# Patient Record
Sex: Male | Born: 1955 | Race: Black or African American | Hispanic: No | Marital: Married | Smoking: Former smoker
Health system: Southern US, Community
[De-identification: ages and names within clinical notes are randomized; demographics above are authoritative.]

## PROBLEM LIST (undated history)

## (undated) DIAGNOSIS — J029 Acute pharyngitis, unspecified: Secondary | ICD-10-CM

## (undated) DIAGNOSIS — G629 Polyneuropathy, unspecified: Secondary | ICD-10-CM

## (undated) DIAGNOSIS — M1990 Unspecified osteoarthritis, unspecified site: Secondary | ICD-10-CM

## (undated) DIAGNOSIS — Z973 Presence of spectacles and contact lenses: Secondary | ICD-10-CM

## (undated) DIAGNOSIS — G4730 Sleep apnea, unspecified: Secondary | ICD-10-CM

## (undated) DIAGNOSIS — K219 Gastro-esophageal reflux disease without esophagitis: Secondary | ICD-10-CM

## (undated) DIAGNOSIS — Z974 Presence of external hearing-aid: Secondary | ICD-10-CM

## (undated) DIAGNOSIS — R7989 Other specified abnormal findings of blood chemistry: Secondary | ICD-10-CM

## (undated) DIAGNOSIS — E785 Hyperlipidemia, unspecified: Secondary | ICD-10-CM

## (undated) HISTORY — DX: Unspecified osteoarthritis, unspecified site: M19.90

## (undated) HISTORY — PX: COLONOSCOPY: SHX174

## (undated) HISTORY — PX: APPENDECTOMY: SHX54

## (undated) HISTORY — DX: Hyperlipidemia, unspecified: E78.5

## (undated) HISTORY — DX: Acute pharyngitis, unspecified: J02.9

## (undated) HISTORY — PX: CARPAL TUNNEL RELEASE: SHX101

## (undated) HISTORY — PX: NECK SURGERY: SHX720

---

## 2001-03-12 ENCOUNTER — Emergency Department (HOSPITAL_COMMUNITY): Admission: EM | Admit: 2001-03-12 | Discharge: 2001-03-12 | Payer: Self-pay | Admitting: Emergency Medicine

## 2001-03-12 ENCOUNTER — Encounter: Payer: Self-pay | Admitting: Emergency Medicine

## 2002-02-12 ENCOUNTER — Encounter: Payer: Self-pay | Admitting: Emergency Medicine

## 2002-02-12 ENCOUNTER — Observation Stay (HOSPITAL_COMMUNITY): Admission: EM | Admit: 2002-02-12 | Discharge: 2002-02-13 | Payer: Self-pay | Admitting: Emergency Medicine

## 2003-03-26 HISTORY — PX: SHOULDER SURGERY: SHX246

## 2003-05-22 ENCOUNTER — Emergency Department (HOSPITAL_COMMUNITY): Admission: EM | Admit: 2003-05-22 | Discharge: 2003-05-22 | Payer: Self-pay | Admitting: Emergency Medicine

## 2003-06-02 ENCOUNTER — Encounter: Admission: RE | Admit: 2003-06-02 | Discharge: 2003-06-02 | Payer: Self-pay | Admitting: Internal Medicine

## 2003-08-24 ENCOUNTER — Encounter: Admission: RE | Admit: 2003-08-24 | Discharge: 2003-08-24 | Payer: Self-pay | Admitting: Internal Medicine

## 2003-10-16 ENCOUNTER — Emergency Department (HOSPITAL_COMMUNITY): Admission: EM | Admit: 2003-10-16 | Discharge: 2003-10-16 | Payer: Self-pay | Admitting: Emergency Medicine

## 2004-04-10 ENCOUNTER — Emergency Department (HOSPITAL_COMMUNITY): Admission: EM | Admit: 2004-04-10 | Discharge: 2004-04-10 | Payer: Self-pay | Admitting: Emergency Medicine

## 2004-11-30 ENCOUNTER — Ambulatory Visit (HOSPITAL_COMMUNITY): Admission: RE | Admit: 2004-11-30 | Discharge: 2004-11-30 | Payer: Self-pay | Admitting: Gastroenterology

## 2004-11-30 LAB — HM COLONOSCOPY

## 2006-11-13 ENCOUNTER — Emergency Department (HOSPITAL_COMMUNITY): Admission: EM | Admit: 2006-11-13 | Discharge: 2006-11-13 | Payer: Self-pay | Admitting: Emergency Medicine

## 2006-12-13 ENCOUNTER — Emergency Department (HOSPITAL_COMMUNITY): Admission: EM | Admit: 2006-12-13 | Discharge: 2006-12-13 | Payer: Self-pay | Admitting: Emergency Medicine

## 2007-01-15 ENCOUNTER — Ambulatory Visit: Payer: Self-pay | Admitting: Specialist

## 2008-05-06 ENCOUNTER — Inpatient Hospital Stay: Payer: Self-pay | Admitting: Vascular Surgery

## 2009-06-26 ENCOUNTER — Emergency Department: Payer: Self-pay | Admitting: Emergency Medicine

## 2010-12-10 ENCOUNTER — Emergency Department (HOSPITAL_COMMUNITY)
Admission: EM | Admit: 2010-12-10 | Discharge: 2010-12-11 | Disposition: A | Discharge status: Home / Self Care | Payer: BC Managed Care – PPO | Attending: Emergency Medicine | Admitting: Emergency Medicine

## 2010-12-10 DIAGNOSIS — R5381 Other malaise: Secondary | ICD-10-CM | POA: Insufficient documentation

## 2010-12-10 DIAGNOSIS — R51 Headache: Secondary | ICD-10-CM | POA: Insufficient documentation

## 2010-12-10 DIAGNOSIS — J029 Acute pharyngitis, unspecified: Secondary | ICD-10-CM | POA: Insufficient documentation

## 2010-12-11 ENCOUNTER — Ambulatory Visit (INDEPENDENT_AMBULATORY_CARE_PROVIDER_SITE_OTHER): Payer: BC Managed Care – PPO | Admitting: General Surgery

## 2010-12-11 ENCOUNTER — Encounter (INDEPENDENT_AMBULATORY_CARE_PROVIDER_SITE_OTHER): Payer: Self-pay | Admitting: General Surgery

## 2010-12-11 VITALS — BP 114/82 | HR 60 | Temp 96.6°F | Resp 14 | Ht 68.0 in | Wt 184.1 lb

## 2010-12-11 DIAGNOSIS — K648 Other hemorrhoids: Secondary | ICD-10-CM

## 2010-12-11 MED ORDER — HYDROCORTISONE ACETATE 25 MG RE SUPP: 25.0000 mg | Freq: Two times a day (BID) | RECTAL | Status: AC

## 2010-12-11 NOTE — Progress Notes (Signed)
Chief Complaint  Patient presents with  . Other    eval of hemorrhoids     HPI ARCHIT LEGER is a 55 y.o. male.  The patient comes in with the complaint of hemorrhoids HPI  No past medical history on file.  No past surgical history on file.  No family history on file.  Social History History  Substance Use Topics  . Smoking status: Not on file  . Smokeless tobacco: Not on file  . Alcohol Use: Not on file    No Known Allergies  No current outpatient prescriptions on file.    Review of Systems Review of Systems  Blood pressure 114/82, pulse 60, temperature 96.6 F (35.9 C), resp. rate 14, height 5\' 8"  (1.727 m), weight 184 lb 2 oz (83.519 kg).  Physical Exam Physical Exam  Data Reviewed December 11, 2010  Assessment    Internal and external hemorrhoids at the 1:00 position    Plan    Rubber band ligation of internal hemorrhoid at 1:00 position.  Medical management with Anusol for two weeks.  Re-examinie patient in 3 weeks.      Jacquez Sheetz III,Nasiyah Laverdiere O 12/11/2010, 12:25 PM

## 2011-01-01 ENCOUNTER — Encounter (INDEPENDENT_AMBULATORY_CARE_PROVIDER_SITE_OTHER): Payer: BC Managed Care – PPO | Admitting: General Surgery

## 2011-01-21 ENCOUNTER — Ambulatory Visit (INDEPENDENT_AMBULATORY_CARE_PROVIDER_SITE_OTHER): Payer: BC Managed Care – PPO | Admitting: Surgery

## 2011-02-11 ENCOUNTER — Ambulatory Visit (INDEPENDENT_AMBULATORY_CARE_PROVIDER_SITE_OTHER): Payer: BC Managed Care – PPO | Admitting: Surgery

## 2011-02-11 ENCOUNTER — Encounter (INDEPENDENT_AMBULATORY_CARE_PROVIDER_SITE_OTHER): Payer: Self-pay | Admitting: Surgery

## 2011-02-11 VITALS — BP 132/88 | HR 60 | Temp 97.4°F | Resp 16 | Ht 68.0 in | Wt 187.0 lb

## 2011-02-11 DIAGNOSIS — K644 Residual hemorrhoidal skin tags: Secondary | ICD-10-CM

## 2011-02-11 NOTE — Progress Notes (Signed)
Subjective:     Patient ID: Andrew Fisher, male   DOB: 08-06-1955, 55 y.o.   MRN: [id number]  HPI This is a patient of Dr. Dixon Boos who he saw in September and banded hemorrhoids. The patient reports that he had significant discomfort after the banding. He initially saw him for perianal discomfort. He had failed conservative measures. He reports he is now doing well until today when he had some mild bleeding in his stool. He currently has no discomfort.  Review of Systems     Objective:   Physical Exam On exam there is a moderate sized external skin tag and a small area of excoriated skin. Digital exam and anoscopy was otherwise unremarkable    Assessment:     Bleeding external hemorrhoid    Plan:     At this point I'm going to try conservative measures with Analpram cream. He is not interested in surgery at this point. I will see him back in 4 weeks. If this worsens I will see if he wants to have the area removed in the operating room

## 2011-03-11 ENCOUNTER — Ambulatory Visit (INDEPENDENT_AMBULATORY_CARE_PROVIDER_SITE_OTHER): Payer: BC Managed Care – PPO | Admitting: Surgery

## 2011-03-11 ENCOUNTER — Encounter (INDEPENDENT_AMBULATORY_CARE_PROVIDER_SITE_OTHER): Payer: Self-pay | Admitting: Surgery

## 2011-03-11 VITALS — BP 140/82 | HR 64 | Temp 97.6°F | Resp 16 | Ht 68.0 in | Wt 188.1 lb

## 2011-03-11 DIAGNOSIS — K648 Other hemorrhoids: Secondary | ICD-10-CM

## 2011-03-11 NOTE — Progress Notes (Signed)
Subjective:     Patient ID: Andrew Fisher, male   DOB: 19-Feb-1956, 55 y.o.   MRN: [id number]  HPI He reports significant improvement with a steroid cream. He has minimal perianal discomfort and no bleeding  Review of Systems     Objective:   Physical Exam On exam, the hemorrhoids have progressed significantly.    Assessment:     Patient with improving inflamed edematous hemorrhoids    Plan:     He will continue the steroid cream. He is interested in conservative measures only and no surgery. I will see him back as needed

## 2011-07-23 ENCOUNTER — Encounter (INDEPENDENT_AMBULATORY_CARE_PROVIDER_SITE_OTHER): Payer: Self-pay | Admitting: Surgery

## 2011-07-23 ENCOUNTER — Telehealth (INDEPENDENT_AMBULATORY_CARE_PROVIDER_SITE_OTHER): Payer: Self-pay | Admitting: General Surgery

## 2011-07-23 NOTE — Telephone Encounter (Signed)
Faxed back refill on Hydrocort - pramoxine 2.5-1% cream faxed back to [facility] with 1 refill ok per Dr Magnus Ivan

## 2012-11-09 ENCOUNTER — Telehealth (INDEPENDENT_AMBULATORY_CARE_PROVIDER_SITE_OTHER): Payer: Self-pay | Admitting: General Surgery

## 2012-11-09 NOTE — Telephone Encounter (Signed)
Faxed Rx back to [facility] Hydrocort-Pramoxine 2.5-1% . No refill per Dr Magnus Ivan. Patient will need to see Family Doctor

## 2014-10-05 ENCOUNTER — Ambulatory Visit
Admission: RE | Admit: 2014-10-05 | Discharge: 2014-10-05 | Disposition: A | Discharge status: Home / Self Care | Payer: 59 | Source: Ambulatory Visit | Attending: Internal Medicine | Admitting: Internal Medicine

## 2014-10-05 ENCOUNTER — Other Ambulatory Visit: Payer: Self-pay | Admitting: Nurse Practitioner

## 2014-10-05 DIAGNOSIS — K5900 Constipation, unspecified: Secondary | ICD-10-CM

## 2014-12-17 ENCOUNTER — Emergency Department (HOSPITAL_COMMUNITY)
Admission: EM | Admit: 2014-12-17 | Discharge: 2014-12-17 | Disposition: A | Discharge status: Home / Self Care | Payer: 59 | Attending: Emergency Medicine | Admitting: Emergency Medicine

## 2014-12-17 ENCOUNTER — Encounter (HOSPITAL_COMMUNITY): Payer: Self-pay | Admitting: *Deleted

## 2014-12-17 DIAGNOSIS — H9190 Unspecified hearing loss, unspecified ear: Secondary | ICD-10-CM | POA: Insufficient documentation

## 2014-12-17 DIAGNOSIS — Z87891 Personal history of nicotine dependence: Secondary | ICD-10-CM | POA: Insufficient documentation

## 2014-12-17 DIAGNOSIS — H1131 Conjunctival hemorrhage, right eye: Secondary | ICD-10-CM | POA: Diagnosis not present

## 2014-12-17 DIAGNOSIS — Z8719 Personal history of other diseases of the digestive system: Secondary | ICD-10-CM | POA: Insufficient documentation

## 2014-12-17 DIAGNOSIS — Z7952 Long term (current) use of systemic steroids: Secondary | ICD-10-CM | POA: Insufficient documentation

## 2014-12-17 DIAGNOSIS — J45909 Unspecified asthma, uncomplicated: Secondary | ICD-10-CM | POA: Insufficient documentation

## 2014-12-17 DIAGNOSIS — Z8639 Personal history of other endocrine, nutritional and metabolic disease: Secondary | ICD-10-CM | POA: Insufficient documentation

## 2014-12-17 DIAGNOSIS — Z79899 Other long term (current) drug therapy: Secondary | ICD-10-CM | POA: Insufficient documentation

## 2014-12-17 DIAGNOSIS — H578 Other specified disorders of eye and adnexa: Secondary | ICD-10-CM | POA: Diagnosis present

## 2014-12-17 DIAGNOSIS — M2550 Pain in unspecified joint: Secondary | ICD-10-CM | POA: Diagnosis not present

## 2014-12-17 DIAGNOSIS — Z8709 Personal history of other diseases of the respiratory system: Secondary | ICD-10-CM | POA: Diagnosis not present

## 2014-12-17 MED ORDER — KETOROLAC TROMETHAMINE 0.5 % OP SOLN: 1.0000 [drp] | Freq: Four times a day (QID) | OPHTHALMIC | Status: DC

## 2014-12-17 MED ORDER — MELOXICAM 15 MG PO TABS: 15.0000 mg | ORAL_TABLET | Freq: Every day | ORAL | Status: DC

## 2014-12-17 NOTE — ED Notes (Signed)
Pt states redness to right eye. States redness has spread. Denies itching or drainage.

## 2014-12-17 NOTE — ED Provider Notes (Signed)
CSN: [id number]     Arrival date & time 12/17/14  1544 History   First MD Initiated Contact with Patient 12/17/14 1604     Chief Complaint  Patient presents with  . Eye Problem     (Consider location/radiation/quality/duration/timing/severity/associated sxs/prior Treatment) Patient is a 59 y.o. male presenting with eye problem. The history is provided by the patient.  Eye Problem Location:  R eye Severity:  Mild Onset quality:  Sudden Duration: Woke up this AM with eye redness. Timing:  Constant Progression:  Worsening Chronicity:  New Context: not direct trauma, not foreign body and not scratch   Relieved by:  Nothing Worsened by:  Nothing tried Ineffective treatments:  None tried Associated symptoms: redness   Associated symptoms: no blurred vision, no decreased vision, no double vision, no headaches, no itching, no nausea and no photophobia     Past Medical History  Diagnosis Date  . Arthritis   . Asthma   . Hyperlipidemia   . Hearing loss   . Sore throat   . Hemorrhoids    Past Surgical History  Procedure Laterality Date  . Appendectomy    . Shoulder surgery  2005    rotator cuff and tendons repaired   No family history on file. Social History  Substance Use Topics  . Smoking status: Former Games developer  . Smokeless tobacco: Never Used  . Alcohol Use: 1.2 oz/week    2 Glasses of wine per week    Review of Systems  HENT: Positive for hearing loss.   Eyes: Positive for redness. Negative for blurred vision, double vision, photophobia, pain and itching.  Gastrointestinal: Negative for nausea.  Musculoskeletal: Positive for arthralgias.  Neurological: Negative for headaches.  All other systems reviewed and are negative.     Allergies  Review of patient's allergies indicates no known allergies.  Home Medications   Prior to Admission medications   Medication Sig Start Date End Date Taking? Authorizing Provider  Choline Fenofibrate (TRILIPIX PO) Take by  mouth every hemodialysis.      Historical Provider, MD  Dexlansoprazole (DEXILANT PO) Take by mouth daily.      Historical Provider, MD  DiphenhydrAMINE HCl (ALLERGY EX) Apply topically 2 (two) times daily.      Historical Provider, MD  hydrocortisone 2.5 % cream Apply topically 2 (two) times daily.    Historical Provider, MD  levalbuterol [facility] HFA) 45 MCG/ACT inhaler Inhale 1-2 puffs into the lungs every 4 (four) hours as needed.      Historical Provider, MD  Montelukast Sodium (SINGULAIR PO) Take by mouth daily.      Historical Provider, MD  Testosterone Randa Ngo TD) Place onto the skin daily.      Historical Provider, MD   BP 126/81 mmHg  Pulse 98  Temp(Src) 97.5 F (36.4 C) (Oral)  Resp 18  Ht  (1.727 m)  Wt 202 lb (91.627 kg)  BMI 30.72 kg/m2  SpO2 98% Physical Exam  Constitutional: He is oriented to person, place, and time. He appears well-developed and well-nourished.  Non-toxic appearance.  HENT:  Head: Normocephalic.  Right Ear: Tympanic membrane and external ear normal.  Left Ear: Tympanic membrane and external ear normal.  Eyes: EOM and lids are normal. Pupils are equal, round, and reactive to light.  Fundoscopic exam:      The right eye shows no AV nicking, no exudate, no hemorrhage and no papilledema.       The left eye shows no AV nicking, no exudate, no hemorrhage  and no papilledema.  Slit lamp exam:      The right eye shows no corneal abrasion, no corneal ulcer, no foreign body, no hyphema, no fluorescein uptake and no anterior chamber bulge.       The left eye shows no anterior chamber bulge.  The pupils are equal and reactive to light. The extra ocular movements are intact. There is increase redness in the conjunctiva the nasal corner, extending to the lower lid. In the lateral lower lid there is a fluid-filled sac/cyst, that easily retracts when the lid is released. The anterior chamber is clear. There is no stye or other problem involving the lids. There is  no foreign body appreciated.  Neck: Normal range of motion. Neck supple. Carotid bruit is not present.  Cardiovascular: Normal rate, regular rhythm, normal heart sounds, intact distal pulses and normal pulses.   Pulmonary/Chest: Breath sounds normal. No respiratory distress.  Abdominal: Soft. Bowel sounds are normal. There is no tenderness. There is no guarding.  Musculoskeletal: Normal range of motion.  Lymphadenopathy:       Head (right side): No submandibular adenopathy present.       Head (left side): No submandibular adenopathy present.    He has no cervical adenopathy.  Neurological: He is alert and oriented to person, place, and time. He has normal strength. No cranial nerve deficit or sensory deficit.  Skin: Skin is warm and dry.  Psychiatric: He has a normal mood and affect. His speech is normal.  Nursing note and vitals reviewed.   ED Course  Pt seen with me by Dr Adriana Simas.  Procedures (including critical care time) Labs Review Labs Reviewed - No data to display  Imaging Review No results found. I have personally reviewed and evaluated these images and lab results as part of my medical decision-making.   EKG Interpretation None      MDM  Vital signs reviewed. Visual acuity reviewed. The examination favors a subconjunctival hemorrhage, and possibly a fluid-filled inflammatory cyst involving the conjunctiva. I have asked patient to use ketorolac eyedrops, and meloxicam tablet daily for the inflammation. I've also asked to use cold compress to the eye. He will see his ophthalmology specialist for additional evaluation if not improving. Questions were answered. The patient is in agreement with this discharge plan.    Final diagnoses:  None    **I have reviewed nursing notes, vital signs, and all appropriate lab and imaging results for this patient.Ivery Quale, PA-C 12/17/14 1707  Donnetta Hutching, MD 12/17/14 2213

## 2014-12-17 NOTE — Discharge Instructions (Signed)
You have a small amount of bleeding under the clear coating of your right eye (subconjunctival hemorrhage). You also have inflammation of the conjunctiva of the lower lid. This has formed a small bubble of fluid. Please use ketorolac eyedrops with breakfast, lunch, dinner, and bedtime over the next 7 days. Please also use meloxicam one daily with food. Please apply a cool compress to your eye is much as possible. This condition is self-limiting, and should go away on its own within the next 7-10 days. Please see your eye specialist, if not improving. Subconjunctival Hemorrhage A subconjunctival hemorrhage is a bright red patch covering a portion of the white of the eye. The white part of the eye is called the sclera, and it is covered by a thin membrane called the conjunctiva. This membrane is clear, except for tiny blood vessels that you can see with the naked eye. When your eye is irritated or inflamed and becomes red, it is because the vessels in the conjunctiva are swollen. Sometimes, a blood vessel in the conjunctiva can break and bleed. When this occurs, the blood builds up between the conjunctiva and the sclera, and spreads out to create a red area. The red spot may be very small at first. It may then spread to cover a larger part of the surface of the eye, or even all of the visible white part of the eye. In almost all cases, the blood will go away and the eye will become white again. Before completely dissolving, however, the red area may spread. It may also become brownish-yellow in color before going away. If a lot of blood collects under the conjunctiva, it may look like a bulge on the surface of the eye. This looks scary, but it will also eventually flatten out and go away. Subconjunctival hemorrhages do not cause pain, but if swollen, may cause a feeling of irritation. There is no effect on vision.  CAUSES   The most common cause is mild trauma (rubbing the eye, irritation).  Subconjunctival  hemorrhages can happen because of coughing or straining (lifting heavy objects), vomiting, or sneezing.  In some cases, your doctor may want to check your blood pressure. High blood pressure can also cause a subconjunctival hemorrhage.  Severe trauma or blunt injuries.  Diseases that affect blood clotting (hemophilia, leukemia).  Abnormalities of blood vessels behind the eye (carotid cavernous sinus fistula).  Tumors behind the eye.  Certain drugs (aspirin, Coumadin, heparin).  Recent eye surgery. HOME CARE INSTRUCTIONS   Do not worry about the appearance of your eye. You may continue your usual activities.  Often, follow-up is not necessary. SEEK MEDICAL CARE IF:   Your eye becomes painful.  The bleeding does not disappear within 3 weeks.  Bleeding occurs elsewhere, for example, under the skin, in the mouth, or in the other eye.  You have recurring subconjunctival hemorrhages. SEEK IMMEDIATE MEDICAL CARE IF:   Your vision changes or you have difficulty seeing.  You develop a severe headache, persistent vomiting, confusion, or abnormal drowsiness (lethargy).  Your eye seems to bulge or protrude from the eye socket.  You notice the sudden appearance of bruises or have spontaneous bleeding elsewhere on your body. Document Released: 03/11/2005 Document Revised: 07/26/2013 Document Reviewed: 02/06/2009 [facility] Patient Information 2015 [place], [place]. This information is not intended to replace advice given to you by your health care provider. Make sure you discuss any questions you have with your health care provider.

## 2015-06-20 ENCOUNTER — Emergency Department (HOSPITAL_COMMUNITY)
Admission: EM | Admit: 2015-06-20 | Discharge: 2015-06-20 | Disposition: A | Discharge status: Home / Self Care | Payer: 59 | Attending: Emergency Medicine | Admitting: Emergency Medicine

## 2015-06-20 ENCOUNTER — Encounter (HOSPITAL_COMMUNITY): Payer: Self-pay | Admitting: Emergency Medicine

## 2015-06-20 ENCOUNTER — Emergency Department (HOSPITAL_COMMUNITY): Payer: 59

## 2015-06-20 DIAGNOSIS — Z79899 Other long term (current) drug therapy: Secondary | ICD-10-CM | POA: Insufficient documentation

## 2015-06-20 DIAGNOSIS — H00024 Hordeolum internum left upper eyelid: Secondary | ICD-10-CM | POA: Diagnosis not present

## 2015-06-20 DIAGNOSIS — E785 Hyperlipidemia, unspecified: Secondary | ICD-10-CM | POA: Insufficient documentation

## 2015-06-20 DIAGNOSIS — Z87891 Personal history of nicotine dependence: Secondary | ICD-10-CM | POA: Diagnosis not present

## 2015-06-20 DIAGNOSIS — Z7982 Long term (current) use of aspirin: Secondary | ICD-10-CM | POA: Insufficient documentation

## 2015-06-20 DIAGNOSIS — J45909 Unspecified asthma, uncomplicated: Secondary | ICD-10-CM | POA: Diagnosis not present

## 2015-06-20 DIAGNOSIS — R42 Dizziness and giddiness: Secondary | ICD-10-CM | POA: Insufficient documentation

## 2015-06-20 DIAGNOSIS — R260 Ataxic gait: Secondary | ICD-10-CM | POA: Insufficient documentation

## 2015-06-20 LAB — CBC WITH DIFFERENTIAL/PLATELET
BASOS ABS: 0 10*3/uL (ref 0.0–0.1)
BASOS PCT: 0 %
EOS PCT: 5 %
Eosinophils Absolute: 0.2 10*3/uL (ref 0.0–0.7)
HCT: 43 % (ref 39.0–52.0)
Hemoglobin: 13.7 g/dL (ref 13.0–17.0)
Lymphocytes Relative: 44 %
Lymphs Abs: 1.4 10*3/uL (ref 0.7–4.0)
MCH: 21.6 pg — ABNORMAL LOW (ref 26.0–34.0)
MCHC: 31.9 g/dL (ref 30.0–36.0)
MCV: 67.7 fL — ABNORMAL LOW (ref 78.0–100.0)
MONO ABS: 0.3 10*3/uL (ref 0.1–1.0)
Monocytes Relative: 10 %
NEUTROS ABS: 1.3 10*3/uL — AB (ref 1.7–7.7)
Neutrophils Relative %: 40 %
Platelets: 180 10*3/uL (ref 150–400)
RBC: 6.35 MIL/uL — ABNORMAL HIGH (ref 4.22–5.81)
RDW: 19.3 % — AB (ref 11.5–15.5)
Smear Review: ADEQUATE
WBC: 3.2 10*3/uL — ABNORMAL LOW (ref 4.0–10.5)

## 2015-06-20 LAB — BASIC METABOLIC PANEL
Anion gap: 7 (ref 5–15)
BUN: 15 mg/dL (ref 6–20)
CALCIUM: 8.8 mg/dL — AB (ref 8.9–10.3)
CO2: 27 mmol/L (ref 22–32)
CREATININE: 1.09 mg/dL (ref 0.61–1.24)
Chloride: 106 mmol/L (ref 101–111)
GFR calc non Af Amer: >60 mL/min (ref >60)
Glucose, Bld: 94 mg/dL (ref 65–99)
Potassium: 4.2 mmol/L (ref 3.5–5.1)
SODIUM: 140 mmol/L (ref 135–145)

## 2015-06-20 LAB — TROPONIN I

## 2015-06-20 MED ORDER — SODIUM CHLORIDE 0.9 % IV BOLUS (SEPSIS)
1000.0000 mL | Freq: Once | INTRAVENOUS | Status: AC
  Administered 2015-06-20: 1000 mL via INTRAVENOUS

## 2015-06-20 MED ORDER — MECLIZINE HCL 12.5 MG PO TABS
25.0000 mg | ORAL_TABLET | Freq: Once | ORAL | Status: AC
  Administered 2015-06-20: 25 mg via ORAL
  Filled 2015-06-20: qty 2

## 2015-06-20 MED ORDER — MECLIZINE HCL 12.5 MG PO TABS: 12.5000 mg | ORAL_TABLET | Freq: Three times a day (TID) | ORAL | Status: DC | PRN

## 2015-06-20 NOTE — ED Provider Notes (Signed)
CSN: [id number]     Arrival date & time 06/20/15  1525 History   First MD Initiated Contact with Patient 06/20/15 1823     Chief Complaint  Patient presents with  . Dizziness     (Consider location/radiation/quality/duration/timing/severity/associated sxs/prior Treatment) HPI Comments: Patient presents with episodes of dizziness that onset this morning after he got to work. He describes with lightheaded sensation as well as vertigo. It is worse with standing and movement. He had a evaluation by his PCP last week for similar symptoms initially improved. She gave him an unknown medication for dizziness which is now out of. History of vertigo in the past. Denies any fever, chills, nausea, vomiting. No chest pain or shortness of breath. No diarrhea. No vision changes. History of asthma, hyperlipidemia and arthritis. Denies any focal weakness, numbness or tingling. 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 He feels improved when he is resting but the symptoms get worse when he stands up. He states he's been eating and drinking well.  The history is provided by the patient.    Past Medical History  Diagnosis Date  . Arthritis   . Asthma   . Hyperlipidemia   . Hearing loss   . Sore throat   . Hemorrhoids    Past Surgical History  Procedure Laterality Date  . Appendectomy    . Shoulder surgery  2005    rotator cuff and tendons repaired   History reviewed. No pertinent family history. Social History  Substance Use Topics  . Smoking status: Former Games developer  . Smokeless tobacco: Never Used  . Alcohol Use: 1.2 oz/week    2 Glasses of wine per week    Review of Systems  Constitutional: Negative for fever, activity change and appetite change.  HENT: Negative for congestion and rhinorrhea.   Eyes: Negative for visual disturbance.  Respiratory: Negative for cough, chest tightness and shortness of breath.    Cardiovascular: Negative for chest pain.  Gastrointestinal: Negative for vomiting and abdominal pain.  Genitourinary: Negative for dysuria and scrotal swelling.  Musculoskeletal: Negative for myalgias and arthralgias.  Skin: Negative for wound.  Neurological: Positive for dizziness and light-headedness. Negative for weakness and headaches.  A complete 10 system review of systems was obtained and all systems are negative except as noted in the HPI and PMH.      Allergies  Acyclovir and related  Home Medications   Prior to Admission medications   Medication Sig Start Date End Date Taking? Authorizing Provider  albuterol (PROAIR HFA) 108 (90 Base) MCG/ACT inhaler Inhale 1-2 puffs into the lungs every 6 (six) hours as needed for wheezing or shortness of breath.   Yes Historical Provider, MD  aspirin EC 81 MG tablet Take 81 mg by mouth daily.   Yes Historical Provider, MD  Choline Fenofibrate (TRILIPIX PO) Take by mouth every hemodialysis.     Yes Historical Provider, MD  Dexlansoprazole (DEXILANT PO) Take by mouth daily.     Yes Historical Provider, MD  montelukast (SINGULAIR) 10 MG tablet Take 10 mg by mouth daily.   Yes Historical Provider, MD  Testosterone (AXIRON) 30 MG/ACT SOLN Place 1 application onto the skin daily. *Underarm   Yes Historical Provider, MD  meclizine (ANTIVERT) 12.5 MG tablet Take 1 tablet (12.5 mg total) by mouth 3 (three) times daily as needed for dizziness. 06/20/15   Glynn Octave, MD   BP 139/92 mmHg  Pulse  68  Temp(Src) 98.3 F (36.8 C)  Resp 17  Ht 6' (1.829 m)  Wt 202 lb (91.627 kg)  BMI 27.39 kg/m2  SpO2 100% Physical Exam  Constitutional: He is oriented to person, place, and time. He appears well-developed and well-nourished. No distress.  HENT:  Head: Normocephalic and atraumatic.  Mouth/Throat: Oropharynx is clear and moist. No oropharyngeal exudate.  No mastoid tenderness. Tympanic membranes normal. No tragus tenderness.  Eyes: Conjunctivae  and EOM are normal. Pupils are equal, round, and reactive to light.  Stye to the left upper lid, no nystagmus  Neck: Normal range of motion. Neck supple.  No meningismus.  Cardiovascular: Normal rate, regular rhythm, normal heart sounds and intact distal pulses.   No murmur heard. Pulmonary/Chest: Effort normal and breath sounds normal. No respiratory distress.  Abdominal: Soft. There is no tenderness. There is no rebound and no guarding.  Musculoskeletal: Normal range of motion. He exhibits no edema or tenderness.  Neurological: He is alert and oriented to person, place, and time. No cranial nerve deficit. He exhibits normal muscle tone. Coordination normal.  No ataxia on finger to nose bilaterally. No pronator drift. 5/5 strength throughout. CN 2-12 intact.Equal grip strength. Sensation intact.  Romberg negative. Mildly ataxic gait. No nystagmus. Head impulse testing negative. Test of skew negative.  Skin: Skin is warm.  Psychiatric: He has a normal mood and affect. His behavior is normal.  Nursing note and vitals reviewed.   ED Course  Procedures (including critical care time) Labs Review Labs Reviewed  CBC WITH DIFFERENTIAL/PLATELET - Abnormal; Notable for the following:    WBC 3.2 (*)    RBC 6.35 (*)    MCV 67.7 (*)    MCH 21.6 (*)    RDW 19.3 (*)    Neutro Abs 1.3 (*)    All other components within normal limits  BASIC METABOLIC PANEL - Abnormal; Notable for the following:    Calcium 8.8 (*)    All other components within normal limits  TROPONIN I    Imaging Review Mr Brain Wo Contrast  06/20/2015  CLINICAL DATA:  Dizziness for several days. EXAM: MRI HEAD WITHOUT CONTRAST TECHNIQUE: Multiplanar, multiecho pulse sequences of the brain and surrounding structures were obtained without intravenous contrast. COMPARISON:  None. FINDINGS: No evidence for acute infarction, hemorrhage, mass lesion, hydrocephalus, or extra-axial fluid. Normal cerebral volume. Mild subcortical and  periventricular T2 and FLAIR hyperintensities, likely chronic microvascular ischemic change. Normal pituitary. Mild tonsillar ectopia without frank Chiari I malformation. Flow voids are maintained throughout the carotid, basilar, and vertebral arteries. There are no areas of chronic hemorrhage. The major dural venous sinuses appear patent. Normal orbits, sinuses, and extracranial soft tissues. Abnormal T2 and FLAIR hyperintensity throughout the mastoids, LEFT greater than RIGHT, without nasopharyngeal findings, could represent mastoid effusions or temporal bone inflammatory process. Correlate clinically for otitis or mastoiditis. IMPRESSION: No acute intracranial findings. Mild subcortical and periventricular T2 and FLAIR hyperintensities, likely chronic microvascular ischemic change. Abnormal T2 bright fluid throughout the mastoid air cells. Correlate clinically for mastoiditis versus simple unrelated mastoid effusions. Electronically Signed   By: Elsie Stain M.D.   On: 06/20/2015 18:16   I have personally reviewed and evaluated these images and lab results as part of my medical decision-making.   EKG Interpretation   Date/Time:  Tuesday June 20 2015 15:39:30 EDT Ventricular Rate:  57 PR Interval:  184 QRS Duration: 84 QT Interval:  338 QTC Calculation: 328 R Axis:   66 Text Interpretation:  Sinus bradycardia Cannot rule out Inferior infarct ,  age undetermined Abnormal ECG No significant change was found Confirmed by  Manus GunningANCOUR  MD, Prudencio Velazco [id number]) on 06/20/2015 6:43:58 PM      MDM   Final diagnoses:  Dizziness  Dizziness described as lightheadedness and vertigo that is worse with position change since this morning. Similar episodes last week. Nonfocal neurological exam. Somewhat ataxic gait. Negative Romberg and no nystagmus. No chest pain or shortness of breath.  Orthostatics are negative. Labs show normal hemoglobin but low MCV and MCH. Troponin negative.  MRI obtained and shows no  acute infarct. There is fluid in the mastoid air cells but no clinical evidence of mastoiditis or otitis.  Patient has no ear pain or mastoid pain. There is no fever or erythema. There is no leukocytosis. There is no evidence of mastoiditis or otitis.  Patient feels improved after meclizine and by mouth and IV fluids. He is tolerating by mouth and ambulatory. Treat as peripheral vertigo. Continue PO hydration, PRN meclizine. No evidence of CVA. Follow up with PCP. Return precautions discussed.  Glynn OctaveStephen Taggert Bozzi, MD 06/21/15 [phone]

## 2015-06-20 NOTE — Discharge Instructions (Signed)
Dizziness Your testing is negative for stroke. Follow up with your doctor. Return to the ED if you develop new or worsening symptoms. Dizziness is a common problem. It is a feeling of unsteadiness or light-headedness. You may feel like you are about to faint. Dizziness can lead to injury if you stumble or fall. Anyone can become dizzy, but dizziness is more common in older adults. This condition can be caused by a number of things, including medicines, dehydration, or illness. HOME CARE INSTRUCTIONS Taking these steps may help with your condition: Eating and Drinking  Drink enough fluid to keep your urine clear or pale yellow. This helps to keep you from becoming dehydrated. Try to drink more clear fluids, such as water.  Do not drink alcohol.  Limit your caffeine intake if directed by your health care provider.  Limit your salt intake if directed by your health care provider. Activity  Avoid making quick movements.  Rise slowly from chairs and steady yourself until you feel okay.  In the morning, first sit up on the side of the bed. When you feel okay, stand slowly while you hold onto something until you know that your balance is fine.  Move your legs often if you need to stand in one place for a long time. Tighten and relax your muscles in your legs while you are standing.  Do not drive or operate heavy machinery if you feel dizzy.  Avoid bending down if you feel dizzy. Place items in your home so that they are easy for you to reach without leaning over. Lifestyle  Do not use any tobacco products, including cigarettes, chewing tobacco, or electronic cigarettes. If you need help quitting, ask your health care provider.  Try to reduce your stress level, such as with yoga or meditation. Talk with your health care provider if you need help. General Instructions  Watch your dizziness for any changes.  Take medicines only as directed by your health care provider. Talk with your health  care provider if you think that your dizziness is caused by a medicine that you are taking.  Tell a friend or a family member that you are feeling dizzy. If he or she notices any changes in your behavior, have this person call your health care provider.  Keep all follow-up visits as directed by your health care provider. This is important. SEEK MEDICAL CARE IF:  Your dizziness does not go away.  Your dizziness or light-headedness gets worse.  You feel nauseous.  You have reduced hearing.  You have new symptoms.  You are unsteady on your feet or you feel like the room is spinning. SEEK IMMEDIATE MEDICAL CARE IF:  You vomit or have diarrhea and are unable to eat or drink anything.  You have problems talking, walking, swallowing, or using your arms, hands, or legs.  You feel generally weak.  You are not thinking clearly or you have trouble forming sentences. It may take a friend or family member to notice this.  You have chest pain, abdominal pain, shortness of breath, or sweating.  Your vision changes.  You notice any bleeding.  You have a headache.  You have neck pain or a stiff neck.  You have a fever.   This information is not intended to replace advice given to you by your health care provider. Make sure you discuss any questions you have with your health care provider.   Document Released: 09/04/2000 Document Revised: 07/26/2014 Document Reviewed: 03/07/2014 Elsevier Interactive Patient Education  2016 Elsevier Inc. ° °

## 2015-06-20 NOTE — ED Notes (Signed)
Pt drinking water and ambulated in the hallway. Pt states feels little light headed,better than before.

## 2015-06-20 NOTE — ED Notes (Signed)
Having dizziness since last Tuesday and seen by PCP on Wed.  Dizziness started back today.  Vision changes with movement.  Denies any pain at this time.  Pt says when he stands, he gets lightheaded.  History of vertigo about 10 years ago.

## 2015-10-10 ENCOUNTER — Other Ambulatory Visit: Payer: Self-pay | Admitting: Internal Medicine

## 2015-10-10 DIAGNOSIS — R1032 Left lower quadrant pain: Secondary | ICD-10-CM

## 2015-10-27 ENCOUNTER — Ambulatory Visit
Admission: RE | Admit: 2015-10-27 | Discharge: 2015-10-27 | Disposition: A | Discharge status: Home / Self Care | Payer: 59 | Source: Ambulatory Visit | Attending: Internal Medicine | Admitting: Internal Medicine

## 2015-10-27 DIAGNOSIS — R1032 Left lower quadrant pain: Secondary | ICD-10-CM

## 2015-10-27 MED ORDER — IOPAMIDOL (ISOVUE-300) INJECTION 61%
100.0000 mL | Freq: Once | INTRAVENOUS | Status: AC | PRN
  Administered 2015-10-27: 100 mL via INTRAVENOUS

## 2015-11-29 ENCOUNTER — Encounter (HOSPITAL_COMMUNITY): Payer: Self-pay | Admitting: *Deleted

## 2015-11-29 ENCOUNTER — Emergency Department (HOSPITAL_COMMUNITY)
Admission: EM | Admit: 2015-11-29 | Discharge: 2015-11-29 | Disposition: A | Discharge status: Home / Self Care | Payer: 59 | Attending: Emergency Medicine | Admitting: Emergency Medicine

## 2015-11-29 ENCOUNTER — Emergency Department (HOSPITAL_COMMUNITY): Payer: 59

## 2015-11-29 DIAGNOSIS — Z87891 Personal history of nicotine dependence: Secondary | ICD-10-CM | POA: Insufficient documentation

## 2015-11-29 DIAGNOSIS — J069 Acute upper respiratory infection, unspecified: Secondary | ICD-10-CM | POA: Diagnosis not present

## 2015-11-29 DIAGNOSIS — Z7982 Long term (current) use of aspirin: Secondary | ICD-10-CM | POA: Diagnosis not present

## 2015-11-29 DIAGNOSIS — Z79899 Other long term (current) drug therapy: Secondary | ICD-10-CM | POA: Insufficient documentation

## 2015-11-29 DIAGNOSIS — J029 Acute pharyngitis, unspecified: Secondary | ICD-10-CM | POA: Diagnosis present

## 2015-11-29 DIAGNOSIS — J45909 Unspecified asthma, uncomplicated: Secondary | ICD-10-CM | POA: Diagnosis not present

## 2015-11-29 LAB — RAPID STREP SCREEN (MED CTR MEBANE ONLY): STREPTOCOCCUS, GROUP A SCREEN (DIRECT): NEGATIVE

## 2015-11-29 MED ORDER — DM-GUAIFENESIN ER 30-600 MG PO TB12
1.0000 | ORAL_TABLET | Freq: Two times a day (BID) | ORAL | Status: DC
  Administered 2015-11-29: 1 via ORAL
  Filled 2015-11-29: qty 1

## 2015-11-29 NOTE — Discharge Instructions (Signed)
Drink plenty of fluids. You can take Claritin over-the-counter for your nasal congestion and sneezing. You can take Mucinex DM for your cough. Use your inhaler as needed for wheezing. Recheck if you get a high fever or if you're not improving over the next 10 days.

## 2015-11-29 NOTE — ED Notes (Signed)
Pt alert & oriented x4, stable gait. Patient given discharge instructions, paperwork & prescription(s). Patient  instructed to stop at the registration desk to finish any additional paperwork. Patient verbalized understanding. Pt left department w/ no further questions. 

## 2015-11-29 NOTE — ED Provider Notes (Signed)
[facility] Provider Note   CSN: [id number] Arrival date & time: 11/29/15  0027  Time Seen 02:45 AM   History   Chief Complaint Chief Complaint  Patient presents with  . Cough    HPI Andrew Fisher is a 60 y.o. male.  HPI patient states September 2 he started having a sore throat, sneezing, nasal stuffiness, upper chest discomfort when he coughs, and a cough that sometimes has minimal amount of white or clear mucus with a yellow tinge in it that started today. He denies fever or chills. He states he had nausea without vomiting today and then got a little sweaty. He had some shortness of breath but states it was not his asthma or wheezing. He states he has not had diarrhea. He has not been around anybody else who is ill. He took some over-the-counter medications for cough without relief and he did some salt water gargles for his sore throat  PCP Dr Maggie Font  Past Medical History:  Diagnosis Date  . Arthritis   . Asthma   . Hearing loss   . Hemorrhoids   . Hyperlipidemia   . Sore throat     Patient Active Problem List   Diagnosis Date Noted  . Hemorrhoids, internal 12/11/2010    Past Surgical History:  Procedure Laterality Date  . APPENDECTOMY    . NECK SURGERY    . SHOULDER SURGERY  2005   rotator cuff and tendons repaired       Home Medications    Prior to Admission medications   Medication Sig Start Date End Date Taking? Authorizing Provider  albuterol (PROAIR HFA) 108 (90 Base) MCG/ACT inhaler Inhale 1-2 puffs into the lungs every 6 (six) hours as needed for wheezing or shortness of breath.   Yes Historical Provider, MD  aspirin EC 81 MG tablet Take 81 mg by mouth daily.   Yes Historical Provider, MD  atorvastatin (LIPITOR) 10 MG tablet Take 10 mg by mouth daily.   Yes Historical Provider, MD  cholecalciferol (VITAMIN D) 1000 units tablet Take 1,000 Units by mouth daily.   Yes Historical Provider, MD  Dexlansoprazole (DEXILANT PO) Take by mouth  daily.     Yes Historical Provider, MD  gabapentin (NEURONTIN) 300 MG capsule Take 300 mg by mouth 4 (four) times daily.   Yes Historical Provider, MD  lansoprazole (PREVACID) 30 MG capsule Take 30 mg by mouth daily at 12 noon.   Yes Historical Provider, MD  meclizine (ANTIVERT) 12.5 MG tablet Take 1 tablet (12.5 mg total) by mouth 3 (three) times daily as needed for dizziness. 06/20/15  Yes Glynn Octave, MD  montelukast (SINGULAIR) 10 MG tablet Take 10 mg by mouth daily.   Yes Historical Provider, MD  tamsulosin (FLOMAX) 0.4 MG CAPS capsule Take 0.4 mg by mouth.   Yes Historical Provider, MD  Testosterone (AXIRON) 30 MG/ACT SOLN Place 1 application onto the skin daily. *Underarm   Yes Historical Provider, MD  Choline Fenofibrate (TRILIPIX PO) Take by mouth every hemodialysis.      Historical Provider, MD    Family History No family history on file.  Social History Social History  Substance Use Topics  . Smoking status: Former Games developer  . Smokeless tobacco: Never Used  . Alcohol use 1.2 oz/week    2 Glasses of wine per week  employed   Allergies   Review of patient's allergies indicates no active allergies.   Review of Systems Review of Systems  All other systems reviewed  and are negative.    Physical Exam Updated Vital Signs BP 111/98 (BP Location: Right Arm)   Pulse 64   Temp 98.6 F (37 C) (Oral)   Resp 16   Ht 5\' 8"  (1.727 m)   Wt 202 lb (91.6 kg)   SpO2 95%   BMI 30.71 kg/m   Vital signs normal    Physical Exam  Constitutional: He is oriented to person, place, and time. He appears well-developed and well-nourished.  Non-toxic appearance. He does not appear ill. No distress.  HENT:  Head: Normocephalic and atraumatic.  Right Ear: External ear normal.  Left Ear: External ear normal.  Nose: Nose normal. No mucosal edema or rhinorrhea.  Mouth/Throat: Oropharynx is clear and moist and mucous membranes are normal. No dental abscesses or uvula swelling.  Eyes:  Conjunctivae and EOM are normal. Pupils are equal, round, and reactive to light.  Neck: Normal range of motion and full passive range of motion without pain. Neck supple.  Cardiovascular: Normal rate, regular rhythm and normal heart sounds.  Exam reveals no gallop and no friction rub.   No murmur heard. Pulmonary/Chest: Effort normal and breath sounds normal. No respiratory distress. He has no wheezes. He has no rhonchi. He has no rales. He exhibits no tenderness and no crepitus.  Abdominal: Soft. Normal appearance and bowel sounds are normal. He exhibits no distension. There is no tenderness. There is no rebound and no guarding.  Musculoskeletal: Normal range of motion. He exhibits no edema or tenderness.  Moves all extremities well.   Neurological: He is alert and oriented to person, place, and time. He has normal strength. No cranial nerve deficit.  Skin: Skin is warm, dry and intact. No rash noted. No erythema. No pallor.  Psychiatric: He has a normal mood and affect. His speech is normal and behavior is normal. His mood appears not anxious.  Nursing note and vitals reviewed.    ED Treatments / Results  Labs (all labs ordered are listed, but only abnormal results are displayed) Results for orders placed or performed during the hospital encounter of 11/29/15  Rapid strep screen  Result Value Ref Range   Streptococcus, Group A Screen (Direct) NEGATIVE NEGATIVE     Radiology Dg Chest 2 View  Result Date: 11/29/2015 CLINICAL DATA:  Acute onset of minimal productive cough, sore throat and chest tenderness. Initial encounter. EXAM: CHEST  2 VIEW COMPARISON:  Chest radiograph performed 06/26/2009 FINDINGS: The lungs are well-aerated. Mild vascular congestion is noted. There is no evidence of focal opacification, pleural effusion or pneumothorax. The heart is normal in size; the mediastinal contour is within normal limits. No acute osseous abnormalities are seen. IMPRESSION: Mild vascular  congestion noted.  Lungs remain grossly clear. Electronically Signed   By: Roanna Raider M.D.   On: 11/29/2015 01:12    Procedures Procedures (including critical care time)  Medications Ordered in ED Medications  dextromethorphan-guaiFENesin (MUCINEX DM) 30-600 MG per 12 hr tablet 1 tablet (1 tablet Oral Given 11/29/15 0323)     Initial Impression / Assessment and Plan / ED Course  I have reviewed the triage vital signs and the nursing notes.  Pertinent labs & imaging results that were available during my care of the patient were reviewed by me and considered in my medical decision making (see chart for details).  Clinical Course   Patient was given Mucinex DM while waiting for test results.  4:40 AM patient was given his test results. He has improvement of his  cough with Mucinex DM. We discussed that he has a cold or viral illness. We also discussed antibiotics will not help at this point. He can take Claritin over-the-counter for his nasal stuffiness, Mucinex DM over-the-counter for his cough. He should use his inhalers as needed if he has wheezing. He should be rechecked if he gets a high fever, struggles to breathe despite using his inhaler or if he seems worse.  Final Clinical Impressions(s) / ED Diagnoses   Final diagnoses:  URI (upper respiratory infection)    New Prescriptions OTC claritin and mucinex DM  Plan discharge  Devoria AlbeIva Tityana Pagan, MD, Concha PyoFACEP    Andrew Schaller, MD 11/29/15 [phone]

## 2015-11-29 NOTE — ED Notes (Signed)
Patient transported to X-ray 

## 2015-11-29 NOTE — ED Triage Notes (Signed)
Pt states unproductive couch w/ sore throat & congestion for the past 3 days.

## 2015-12-01 LAB — CULTURE, GROUP A STREP (THRC)

## 2015-12-05 ENCOUNTER — Ambulatory Visit: Payer: 59 | Admitting: Allergy & Immunology

## 2016-12-19 ENCOUNTER — Other Ambulatory Visit: Payer: Self-pay | Admitting: Orthopedic Surgery

## 2017-01-03 NOTE — Pre-Procedure Instructions (Signed)
Andrew Fisher  01/03/2017      [facility] - [place], [place] - [address] [place] [address] Phone: [phone] Fax: [phone]  [facility] - [place], [place] - [address] [place] [address] Phone: [phone] Fax: [phone]    Your procedure is scheduled on Thursday, January 09, 2017  Report to [facility] Admitting Entrance "A" at 5:30 A.M.   Call this number if you have problems the morning of surgery:  [phone]   Remember:  Do not eat food or drink liquids after midnight.  Take these medicines the morning of surgery with A SIP OF WATER: Gabapentin (NEURONTIN), Loratadine (CLARITIN), and Tamsulosin (FLOMAX). If needed HYDROcodone-acetaminophen (NORCO/VICODIN) for pain and Albuterol (PROAIR HFA) inhaler for cough or wheezing (bring with you the day of surgery).  Follow your doctor's instruction for taking Aspirin.  As of today, stop taking all Aspirins, Vitamins, Fish oils, and Herbal medications. Also stop all NSAIDS i.e. Advil, Motrin, Aleve, Anaprox, Naproxen, BC and Goody Powders.    Do not wear jewelry.  Do not wear lotions, powders, colognes, or deodrant.  Do not shave 48 hours prior to surgery.  Men may shave face and neck.  Do not bring valuables to the hospital.  [facility] is not responsible for any belongings or valuables.  Contacts, dentures or bridgework may not be worn into surgery.  Leave your suitcase in the car.  After surgery it may be brought to your room.  For patients admitted to the hospital, discharge time will be determined by your treatment team.  Patients discharged the day of surgery will not be allowed to drive home.   Special instructions:  [facility]- Preparing For Surgery  Before surgery, you can play an important role. Because skin is not sterile, your skin needs to be as free of germs as possible. You can reduce the number of  germs on your skin by washing with CHG (chlorahexidine gluconate) Soap before surgery.  CHG is an antiseptic cleaner which kills germs and bonds with the skin to continue killing germs even after washing.  Please do not use if you have an allergy to CHG or antibacterial soaps. If your skin becomes reddened/irritated stop using the CHG.  Do not shave (including legs and underarms) for at least 48 hours prior to first CHG shower. It is OK to shave your face.  Please follow these instructions carefully.   1. Shower the NIGHT BEFORE SURGERY and the MORNING OF SURGERY with CHG.   2. If you chose to wash your hair, wash your hair first as usual with your normal shampoo.  3. After you shampoo, rinse your hair and body thoroughly to remove the shampoo.  4. Use CHG as you would any other liquid soap. You can apply CHG directly to the skin and wash gently with a scrungie or a clean washcloth.   5. Apply the CHG Soap to your body ONLY FROM THE NECK DOWN.  Do not use on open wounds or open sores. Avoid contact with your eyes, ears, mouth and genitals (private parts). Wash Face and genitals (private parts)  with your normal soap.  6. Wash thoroughly, paying special attention to the area where your surgery will be performed.  7. Thoroughly rinse your body with warm water from the neck down.  8. DO NOT shower/wash with your normal soap after using and rinsing off the  CHG Soap.  9. Pat yourself dry with a CLEAN TOWEL.  10. Wear CLEAN PAJAMAS to bed the night before surgery, wear comfortable clothes the morning of surgery  11. Place CLEAN SHEETS on your bed the night of your first shower and DO NOT SLEEP WITH PETS.  Day of Surgery: Do not apply any deodorants/lotions. Please wear clean clothes to the hospital/surgery center.    Please read over the following fact sheets that you were given. Pain Booklet, Coughing and Deep Breathing, MRSA Information and Surgical Site Infection Prevention

## 2017-01-06 ENCOUNTER — Encounter (HOSPITAL_COMMUNITY)
Admission: RE | Admit: 2017-01-06 | Discharge: 2017-01-06 | Disposition: A | Discharge status: Home / Self Care | Payer: 59 | Source: Ambulatory Visit | Attending: Orthopedic Surgery | Admitting: Orthopedic Surgery

## 2017-01-06 ENCOUNTER — Encounter (HOSPITAL_COMMUNITY): Payer: Self-pay

## 2017-01-06 ENCOUNTER — Ambulatory Visit (HOSPITAL_COMMUNITY)
Admission: RE | Admit: 2017-01-06 | Discharge: 2017-01-06 | Disposition: A | Discharge status: Home / Self Care | Payer: 59 | Source: Ambulatory Visit | Attending: Orthopedic Surgery | Admitting: Orthopedic Surgery

## 2017-01-06 DIAGNOSIS — Z79899 Other long term (current) drug therapy: Secondary | ICD-10-CM | POA: Insufficient documentation

## 2017-01-06 DIAGNOSIS — Z01818 Encounter for other preprocedural examination: Secondary | ICD-10-CM

## 2017-01-06 DIAGNOSIS — G629 Polyneuropathy, unspecified: Secondary | ICD-10-CM | POA: Diagnosis not present

## 2017-01-06 DIAGNOSIS — I491 Atrial premature depolarization: Secondary | ICD-10-CM | POA: Insufficient documentation

## 2017-01-06 DIAGNOSIS — E785 Hyperlipidemia, unspecified: Secondary | ICD-10-CM | POA: Insufficient documentation

## 2017-01-06 DIAGNOSIS — Z01810 Encounter for preprocedural cardiovascular examination: Secondary | ICD-10-CM | POA: Insufficient documentation

## 2017-01-06 DIAGNOSIS — Z87891 Personal history of nicotine dependence: Secondary | ICD-10-CM | POA: Insufficient documentation

## 2017-01-06 DIAGNOSIS — J45909 Unspecified asthma, uncomplicated: Secondary | ICD-10-CM | POA: Insufficient documentation

## 2017-01-06 DIAGNOSIS — Z7982 Long term (current) use of aspirin: Secondary | ICD-10-CM | POA: Diagnosis not present

## 2017-01-06 DIAGNOSIS — K219 Gastro-esophageal reflux disease without esophagitis: Secondary | ICD-10-CM | POA: Insufficient documentation

## 2017-01-06 DIAGNOSIS — J984 Other disorders of lung: Secondary | ICD-10-CM | POA: Diagnosis not present

## 2017-01-06 DIAGNOSIS — G4733 Obstructive sleep apnea (adult) (pediatric): Secondary | ICD-10-CM | POA: Insufficient documentation

## 2017-01-06 HISTORY — DX: Sleep apnea, unspecified: G47.30

## 2017-01-06 HISTORY — DX: Presence of external hearing-aid: Z97.4

## 2017-01-06 HISTORY — DX: Presence of spectacles and contact lenses: Z97.3

## 2017-01-06 HISTORY — DX: Polyneuropathy, unspecified: G62.9

## 2017-01-06 HISTORY — DX: Gastro-esophageal reflux disease without esophagitis: K21.9

## 2017-01-06 HISTORY — DX: Other specified abnormal findings of blood chemistry: R79.89

## 2017-01-06 LAB — URINALYSIS, ROUTINE W REFLEX MICROSCOPIC
Bilirubin Urine: NEGATIVE
GLUCOSE, UA: NEGATIVE mg/dL
HGB URINE DIPSTICK: NEGATIVE
Ketones, ur: NEGATIVE mg/dL
Leukocytes, UA: NEGATIVE
Nitrite: NEGATIVE
PROTEIN: NEGATIVE mg/dL
Specific Gravity, Urine: 1.013 (ref 1.005–1.030)
pH: 7 (ref 5.0–8.0)

## 2017-01-06 LAB — APTT: APTT: 28 s (ref 24–36)

## 2017-01-06 LAB — PROTIME-INR
INR: 1.12
Prothrombin Time: 14.3 seconds (ref 11.4–15.2)

## 2017-01-06 LAB — CBC WITH DIFFERENTIAL/PLATELET
BASOS ABS: 0 10*3/uL (ref 0.0–0.1)
BASOS PCT: 0 %
EOS ABS: 0.3 10*3/uL (ref 0.0–0.7)
Eosinophils Relative: 11 %
HCT: 45 % (ref 39.0–52.0)
Hemoglobin: 14.4 g/dL (ref 13.0–17.0)
LYMPHS ABS: 1.2 10*3/uL (ref 0.7–4.0)
Lymphocytes Relative: 40 %
MCH: 21.2 pg — AB (ref 26.0–34.0)
MCHC: 32 g/dL (ref 30.0–36.0)
MCV: 66.4 fL — ABNORMAL LOW (ref 78.0–100.0)
MONO ABS: 0.3 10*3/uL (ref 0.1–1.0)
Monocytes Relative: 9 %
NEUTROS ABS: 1.1 10*3/uL — AB (ref 1.7–7.7)
Neutrophils Relative %: 40 %
PLATELETS: 155 10*3/uL (ref 150–400)
RBC: 6.78 MIL/uL — ABNORMAL HIGH (ref 4.22–5.81)
RDW: 20 % — AB (ref 11.5–15.5)
WBC: 2.9 10*3/uL — ABNORMAL LOW (ref 4.0–10.5)

## 2017-01-06 LAB — COMPREHENSIVE METABOLIC PANEL
ALBUMIN: 4.1 g/dL (ref 3.5–5.0)
ALK PHOS: 61 U/L (ref 38–126)
ALT: 27 U/L (ref 17–63)
ANION GAP: 8 (ref 5–15)
AST: 28 U/L (ref 15–41)
BUN: 16 mg/dL (ref 6–20)
CALCIUM: 8.8 mg/dL — AB (ref 8.9–10.3)
CHLORIDE: 105 mmol/L (ref 101–111)
CO2: 24 mmol/L (ref 22–32)
Creatinine, Ser: 1.25 mg/dL — ABNORMAL HIGH (ref 0.61–1.24)
GFR calc non Af Amer: >60 mL/min (ref >60)
Glucose, Bld: 94 mg/dL (ref 65–99)
POTASSIUM: 4.1 mmol/L (ref 3.5–5.1)
SODIUM: 137 mmol/L (ref 135–145)
Total Bilirubin: 0.9 mg/dL (ref 0.3–1.2)
Total Protein: 7.2 g/dL (ref 6.5–8.1)

## 2017-01-06 LAB — SURGICAL PCR SCREEN
MRSA, PCR: NEGATIVE
Staphylococcus aureus: NEGATIVE

## 2017-01-06 NOTE — Progress Notes (Signed)
PCP: Dr. Allyne Gee with [facility], last saw 1 month ago Cardiologist: denies  EKG: today CXR: today ECHO: denies Stress Test: denies Cardiac Cath: denies  Patient denies shortness of breath, fever, cough, and chest pain at PAT appointment.  Patient verbalized understanding of instructions provided today at the PAT appointment.  Patient asked to review instructions at home and day of surgery.   Pt had sleep study at [facility] in [place].  Release of information obtained, requested records.  Pt instructed to bring CPAP mask with him DOS.

## 2017-01-07 NOTE — Progress Notes (Signed)
Anesthesia Chart Review: Patient is a 61 year old male scheduled for C4-5, C5-6 ACDF on 01/09/17 by Dr. Estill Bamberg.  History includes former smoker, HLD, asthma, arthritis, hearing loss, GERD, OSA (uses CPAP), low T, hearing aids, peripheral neuropathy, appendectomy, neck surgery.   PCP is Dr. Dorothyann Peng with [facility].  Meds include albuterol, ASA 81 mg (on hold), Lipitor, Neurontin, Norco, Prevacid, Claritin, Singulair, Flomax, testosterone.   BP 135/83   Pulse 69   Temp 36.8 C   Resp 20   Ht 6' (1.829 m)   Wt 211 lb 3.2 oz (95.8 kg)   SpO2 97%   BMI 28.64 kg/m   EKG 01/06/17: SR with PACs, possible inferior infarct, age undetermined. No significant change since last tracing on 06/20/15. (Possible inferior infarct seen on tracings dated back to at least 06/26/09.)  CXR 01/06/17: IMPRESSION: Minimal RIGHT basilar scarring. No acute abnormalities.  Preoperative labs noted. Cr 1.25. Glucose 94. WBC 2.9. Neutrophil 40%, Neut# 1.1 K/uL. (Previously WBC 3.2, Neutrophils 40, Neut# 1.3 on 06/20/15.)  RBC morphology teardrop cells. H/H 14.4/45.0. PLT 155. PT, PTT, UA WNL.   EKG is stable. Discussed CBC with diff results with anesthesiologist Dr. Kipp Brood. Patient Neut# > 1 K/uL. His H/H, PLT count, PT/PTT are WNL. Recommend forwarding to PCP for future follow-up, but otherwise labs felt acceptable for OR. I called and spoke with Karel Jarvis at Dr. Allyne Gee office. I will fax labs with confirmation. I asked Ebony to call me back if labs not received.   Velna Ochs [facility]/Anesthesiology Phone [phone] 01/07/2017 4:07 PM

## 2017-01-08 NOTE — Anesthesia Preprocedure Evaluation (Addendum)
Anesthesia Evaluation  Patient identified by MRN, date of birth, ID band Patient awake    Reviewed: Allergy & Precautions, NPO status , Patient's Chart, lab work & pertinent test results  Airway Mallampati: II  TM Distance: >3 FB Neck ROM: Full    Dental  (+) Dental Advisory Given   Pulmonary asthma , sleep apnea , former smoker,    breath sounds clear to auscultation       Cardiovascular negative cardio ROS   Rhythm:Regular Rate:Normal     Neuro/Psych bilateral arm pain 2/2 cervical stenosis    GI/Hepatic Neg liver ROS, GERD  ,  Endo/Other  negative endocrine ROS  Renal/GU Renal disease     Musculoskeletal   Abdominal   Peds  Hematology negative hematology ROS (+)   Anesthesia Other Findings   Reproductive/Obstetrics                            Lab Results  Component Value Date   WBC 2.9 (L) 01/06/2017   HGB 14.4 01/06/2017   HCT 45.0 01/06/2017   MCV 66.4 (L) 01/06/2017   PLT 155 01/06/2017   Lab Results  Component Value Date   CREATININE 1.25 (H) 01/06/2017   BUN 16 01/06/2017   NA 137 01/06/2017   K 4.1 01/06/2017   CL 105 01/06/2017   CO2 24 01/06/2017    Anesthesia Physical Anesthesia Plan  ASA: II  Anesthesia Plan: General   Post-op Pain Management:    Induction: Intravenous  PONV Risk Score and Plan: 2 and Ondansetron, Dexamethasone and Treatment may vary due to age or medical condition  Airway Management Planned: Oral ETT and Video Laryngoscope Planned  Additional Equipment:   Intra-op Plan:   Post-operative Plan: Extubation in OR  Informed Consent: I have reviewed the patients History and Physical, chart, labs and discussed the procedure including the risks, benefits and alternatives for the proposed anesthesia with the patient or authorized representative who has indicated his/her understanding and acceptance.   Dental advisory given  Plan Discussed  with: CRNA  Anesthesia Plan Comments:        Anesthesia Quick Evaluation

## 2017-01-09 ENCOUNTER — Ambulatory Visit (HOSPITAL_COMMUNITY): Payer: 59 | Admitting: Anesthesiology

## 2017-01-09 ENCOUNTER — Ambulatory Visit (HOSPITAL_COMMUNITY): Payer: 59 | Admitting: Vascular Surgery

## 2017-01-09 ENCOUNTER — Ambulatory Visit (HOSPITAL_COMMUNITY): Payer: 59

## 2017-01-09 ENCOUNTER — Encounter (HOSPITAL_COMMUNITY): Payer: Self-pay | Admitting: *Deleted

## 2017-01-09 ENCOUNTER — Encounter (HOSPITAL_COMMUNITY)
Admission: RE | Disposition: A | Discharge status: Home / Self Care | Payer: Self-pay | Source: Ambulatory Visit | Attending: Orthopedic Surgery

## 2017-01-09 ENCOUNTER — Observation Stay (HOSPITAL_COMMUNITY)
Admission: RE | Admit: 2017-01-09 | Discharge: 2017-01-10 | Disposition: A | Discharge status: Home / Self Care | Payer: 59 | Source: Ambulatory Visit | Attending: Orthopedic Surgery | Admitting: Orthopedic Surgery

## 2017-01-09 DIAGNOSIS — Z9989 Dependence on other enabling machines and devices: Secondary | ICD-10-CM | POA: Insufficient documentation

## 2017-01-09 DIAGNOSIS — Z87891 Personal history of nicotine dependence: Secondary | ICD-10-CM | POA: Diagnosis not present

## 2017-01-09 DIAGNOSIS — Z79899 Other long term (current) drug therapy: Secondary | ICD-10-CM | POA: Insufficient documentation

## 2017-01-09 DIAGNOSIS — M79602 Pain in left arm: Secondary | ICD-10-CM | POA: Diagnosis present

## 2017-01-09 DIAGNOSIS — G629 Polyneuropathy, unspecified: Secondary | ICD-10-CM | POA: Insufficient documentation

## 2017-01-09 DIAGNOSIS — M50121 Cervical disc disorder at C4-C5 level with radiculopathy: Principal | ICD-10-CM | POA: Insufficient documentation

## 2017-01-09 DIAGNOSIS — Z419 Encounter for procedure for purposes other than remedying health state, unspecified: Secondary | ICD-10-CM

## 2017-01-09 DIAGNOSIS — Z981 Arthrodesis status: Secondary | ICD-10-CM | POA: Diagnosis not present

## 2017-01-09 DIAGNOSIS — J45909 Unspecified asthma, uncomplicated: Secondary | ICD-10-CM | POA: Insufficient documentation

## 2017-01-09 DIAGNOSIS — M5410 Radiculopathy, site unspecified: Secondary | ICD-10-CM | POA: Diagnosis present

## 2017-01-09 DIAGNOSIS — E785 Hyperlipidemia, unspecified: Secondary | ICD-10-CM | POA: Insufficient documentation

## 2017-01-09 DIAGNOSIS — G4730 Sleep apnea, unspecified: Secondary | ICD-10-CM | POA: Diagnosis not present

## 2017-01-09 DIAGNOSIS — K219 Gastro-esophageal reflux disease without esophagitis: Secondary | ICD-10-CM | POA: Diagnosis not present

## 2017-01-09 DIAGNOSIS — M4802 Spinal stenosis, cervical region: Secondary | ICD-10-CM | POA: Insufficient documentation

## 2017-01-09 DIAGNOSIS — Z7982 Long term (current) use of aspirin: Secondary | ICD-10-CM | POA: Diagnosis not present

## 2017-01-09 HISTORY — PX: ANTERIOR CERVICAL DECOMP/DISCECTOMY FUSION: SHX1161

## 2017-01-09 SURGERY — ANTERIOR CERVICAL DECOMPRESSION/DISCECTOMY FUSION 2 LEVELS
Anesthesia: General

## 2017-01-09 MED ORDER — SUGAMMADEX SODIUM 200 MG/2ML IV SOLN
INTRAVENOUS | Status: DC | PRN
  Administered 2017-01-09: 200 mg via INTRAVENOUS

## 2017-01-09 MED ORDER — CEFAZOLIN SODIUM-DEXTROSE 2-4 GM/100ML-% IV SOLN
2.0000 g | INTRAVENOUS | Status: AC
  Administered 2017-01-09: 2 g via INTRAVENOUS

## 2017-01-09 MED ORDER — MIDAZOLAM HCL 2 MG/2ML IJ SOLN
INTRAMUSCULAR | Status: AC
  Filled 2017-01-09: qty 2

## 2017-01-09 MED ORDER — HYDROMORPHONE HCL 1 MG/ML IJ SOLN: 0.2500 mg | INTRAMUSCULAR | Status: DC | PRN

## 2017-01-09 MED ORDER — MENTHOL 3 MG MT LOZG
1.0000 | LOZENGE | OROMUCOSAL | Status: DC | PRN
  Filled 2017-01-09: qty 9

## 2017-01-09 MED ORDER — LACTATED RINGERS IV SOLN
INTRAVENOUS | Status: DC | PRN
  Administered 2017-01-09 (×2): via INTRAVENOUS

## 2017-01-09 MED ORDER — MIDAZOLAM HCL 2 MG/2ML IJ SOLN
INTRAMUSCULAR | Status: DC | PRN
  Administered 2017-01-09: 2 mg via INTRAVENOUS

## 2017-01-09 MED ORDER — MONTELUKAST SODIUM 10 MG PO TABS
10.0000 mg | ORAL_TABLET | Freq: Every day | ORAL | Status: DC
  Administered 2017-01-10: 10 mg via ORAL
  Filled 2017-01-09: qty 1

## 2017-01-09 MED ORDER — FENTANYL CITRATE (PF) 250 MCG/5ML IJ SOLN
INTRAMUSCULAR | Status: AC
  Filled 2017-01-09: qty 5

## 2017-01-09 MED ORDER — VITAMIN D (ERGOCALCIFEROL) 1.25 MG (50000 UNIT) PO CAPS
50000.0000 [IU] | ORAL_CAPSULE | ORAL | Status: DC
  Administered 2017-01-10: 50000 [IU] via ORAL
  Filled 2017-01-09: qty 1

## 2017-01-09 MED ORDER — ALBUTEROL SULFATE HFA 108 (90 BASE) MCG/ACT IN AERS
INHALATION_SPRAY | RESPIRATORY_TRACT | Status: DC | PRN
  Administered 2017-01-09: 6 via RESPIRATORY_TRACT

## 2017-01-09 MED ORDER — PHENOL 1.4 % MT LIQD: 1.0000 | OROMUCOSAL | Status: DC | PRN

## 2017-01-09 MED ORDER — PROPOFOL 10 MG/ML IV BOLUS
INTRAVENOUS | Status: AC
  Filled 2017-01-09: qty 20

## 2017-01-09 MED ORDER — GABAPENTIN 300 MG PO CAPS
300.0000 mg | ORAL_CAPSULE | Freq: Four times a day (QID) | ORAL | Status: DC
  Administered 2017-01-09 – 2017-01-10 (×3): 300 mg via ORAL
  Filled 2017-01-09 (×3): qty 1

## 2017-01-09 MED ORDER — ARTIFICIAL TEARS OPHTHALMIC OINT
TOPICAL_OINTMENT | OPHTHALMIC | Status: AC
  Filled 2017-01-09: qty 3.5

## 2017-01-09 MED ORDER — DEXAMETHASONE SODIUM PHOSPHATE 10 MG/ML IJ SOLN
INTRAMUSCULAR | Status: DC | PRN
  Administered 2017-01-09: 5 mg via INTRAVENOUS

## 2017-01-09 MED ORDER — ONDANSETRON HCL 4 MG/2ML IJ SOLN
INTRAMUSCULAR | Status: AC
  Filled 2017-01-09: qty 2

## 2017-01-09 MED ORDER — LIDOCAINE 2% (20 MG/ML) 5 ML SYRINGE
INTRAMUSCULAR | Status: AC
  Filled 2017-01-09: qty 5

## 2017-01-09 MED ORDER — ONDANSETRON HCL 4 MG/2ML IJ SOLN: 4.0000 mg | Freq: Four times a day (QID) | INTRAMUSCULAR | Status: DC | PRN

## 2017-01-09 MED ORDER — LIDOCAINE HCL (CARDIAC) 20 MG/ML IV SOLN
INTRAVENOUS | Status: DC | PRN
  Administered 2017-01-09: 60 mg via INTRATRACHEAL

## 2017-01-09 MED ORDER — ATORVASTATIN CALCIUM 20 MG PO TABS
10.0000 mg | ORAL_TABLET | Freq: Every day | ORAL | Status: DC
  Administered 2017-01-09 – 2017-01-10 (×2): 10 mg via ORAL
  Filled 2017-01-09 (×2): qty 1

## 2017-01-09 MED ORDER — BISACODYL 5 MG PO TBEC: 5.0000 mg | DELAYED_RELEASE_TABLET | Freq: Every day | ORAL | Status: DC | PRN

## 2017-01-09 MED ORDER — ZOLPIDEM TARTRATE 5 MG PO TABS: 5.0000 mg | ORAL_TABLET | Freq: Every evening | ORAL | Status: DC | PRN

## 2017-01-09 MED ORDER — PROMETHAZINE HCL 25 MG/ML IJ SOLN
6.2500 mg | INTRAMUSCULAR | Status: DC | PRN
Start: 2017-01-09 — End: 2017-01-09

## 2017-01-09 MED ORDER — MINERAL OIL LIGHT 100 % EX OIL
TOPICAL_OIL | CUTANEOUS | Status: AC
  Filled 2017-01-09: qty 25

## 2017-01-09 MED ORDER — CEFAZOLIN SODIUM-DEXTROSE 2-4 GM/100ML-% IV SOLN
INTRAVENOUS | Status: AC
  Filled 2017-01-09: qty 100

## 2017-01-09 MED ORDER — ACETAMINOPHEN 650 MG RE SUPP: 650.0000 mg | RECTAL | Status: DC | PRN

## 2017-01-09 MED ORDER — FLEET ENEMA 7-19 GM/118ML RE ENEM: 1.0000 | ENEMA | Freq: Once | RECTAL | Status: DC | PRN

## 2017-01-09 MED ORDER — THROMBIN 20000 UNITS EX SOLR
CUTANEOUS | Status: AC
  Filled 2017-01-09: qty 20000

## 2017-01-09 MED ORDER — ALBUTEROL SULFATE (2.5 MG/3ML) 0.083% IN NEBU: 2.5000 mg | INHALATION_SOLUTION | Freq: Four times a day (QID) | RESPIRATORY_TRACT | Status: DC | PRN

## 2017-01-09 MED ORDER — CEFAZOLIN SODIUM-DEXTROSE 2-4 GM/100ML-% IV SOLN
2.0000 g | Freq: Three times a day (TID) | INTRAVENOUS | Status: AC
  Administered 2017-01-09 (×2): 2 g via INTRAVENOUS
  Filled 2017-01-09 (×2): qty 100

## 2017-01-09 MED ORDER — SODIUM CHLORIDE 0.9% FLUSH: 3.0000 mL | INTRAVENOUS | Status: DC | PRN

## 2017-01-09 MED ORDER — PROPOFOL 1000 MG/100ML IV EMUL
INTRAVENOUS | Status: AC
  Filled 2017-01-09: qty 100

## 2017-01-09 MED ORDER — 0.9 % SODIUM CHLORIDE (POUR BTL) OPTIME
TOPICAL | Status: DC | PRN
  Administered 2017-01-09: 1000 mL

## 2017-01-09 MED ORDER — SENNOSIDES-DOCUSATE SODIUM 8.6-50 MG PO TABS: 1.0000 | ORAL_TABLET | Freq: Every evening | ORAL | Status: DC | PRN

## 2017-01-09 MED ORDER — POVIDONE-IODINE 7.5 % EX SOLN: Freq: Once | CUTANEOUS | Status: DC

## 2017-01-09 MED ORDER — TAMSULOSIN HCL 0.4 MG PO CAPS
0.4000 mg | ORAL_CAPSULE | Freq: Every day | ORAL | Status: DC
  Administered 2017-01-09 – 2017-01-10 (×2): 0.4 mg via ORAL
  Filled 2017-01-09 (×2): qty 1

## 2017-01-09 MED ORDER — FENTANYL CITRATE (PF) 250 MCG/5ML IJ SOLN
INTRAMUSCULAR | Status: DC | PRN
  Administered 2017-01-09: 50 ug via INTRAVENOUS
  Administered 2017-01-09: 100 ug via INTRAVENOUS
  Administered 2017-01-09 (×7): 50 ug via INTRAVENOUS

## 2017-01-09 MED ORDER — ONDANSETRON HCL 4 MG PO TABS: 4.0000 mg | ORAL_TABLET | Freq: Four times a day (QID) | ORAL | Status: DC | PRN

## 2017-01-09 MED ORDER — ONDANSETRON HCL 4 MG/2ML IJ SOLN
INTRAMUSCULAR | Status: DC | PRN
  Administered 2017-01-09: 4 mg via INTRAVENOUS

## 2017-01-09 MED ORDER — ALBUTEROL SULFATE HFA 108 (90 BASE) MCG/ACT IN AERS
INHALATION_SPRAY | RESPIRATORY_TRACT | Status: AC
  Filled 2017-01-09: qty 6.7

## 2017-01-09 MED ORDER — PANTOPRAZOLE SODIUM 40 MG PO TBEC: 40.0000 mg | DELAYED_RELEASE_TABLET | Freq: Every day | ORAL | Status: DC

## 2017-01-09 MED ORDER — ADULT MULTIVITAMIN LIQUID CH
15.0000 mL | Freq: Every day | ORAL | Status: DC
  Administered 2017-01-10: 15 mL via ORAL
  Filled 2017-01-09 (×2): qty 15

## 2017-01-09 MED ORDER — PANTOPRAZOLE SODIUM 40 MG IV SOLR
40.0000 mg | Freq: Every day | INTRAVENOUS | Status: DC
  Administered 2017-01-09: 40 mg via INTRAVENOUS
  Filled 2017-01-09: qty 40

## 2017-01-09 MED ORDER — BUPIVACAINE-EPINEPHRINE 0.25% -1:200000 IJ SOLN
INTRAMUSCULAR | Status: AC
  Filled 2017-01-09: qty 1

## 2017-01-09 MED ORDER — ROCURONIUM BROMIDE 50 MG/5ML IV SOLN
INTRAVENOUS | Status: AC
  Filled 2017-01-09: qty 1

## 2017-01-09 MED ORDER — TESTOSTERONE 30 MG/ACT TD SOLN
1.0000 | Freq: Every day | TRANSDERMAL | Status: DC
Start: 2017-01-09 — End: 2017-01-09

## 2017-01-09 MED ORDER — MORPHINE SULFATE (PF) 4 MG/ML IV SOLN
2.0000 mg | INTRAVENOUS | Status: DC | PRN
  Administered 2017-01-09: 2 mg via INTRAVENOUS

## 2017-01-09 MED ORDER — BUPIVACAINE-EPINEPHRINE (PF) 0.25% -1:200000 IJ SOLN
INTRAMUSCULAR | Status: AC
  Filled 2017-01-09: qty 30

## 2017-01-09 MED ORDER — ROCURONIUM BROMIDE 100 MG/10ML IV SOLN
INTRAVENOUS | Status: DC | PRN
  Administered 2017-01-09: 50 mg via INTRAVENOUS

## 2017-01-09 MED ORDER — DOCUSATE SODIUM 100 MG PO CAPS
100.0000 mg | ORAL_CAPSULE | Freq: Two times a day (BID) | ORAL | Status: DC
  Administered 2017-01-09 – 2017-01-10 (×3): 100 mg via ORAL
  Filled 2017-01-09 (×3): qty 1

## 2017-01-09 MED ORDER — ALUM & MAG HYDROXIDE-SIMETH 200-200-20 MG/5ML PO SUSP: 30.0000 mL | Freq: Four times a day (QID) | ORAL | Status: DC | PRN

## 2017-01-09 MED ORDER — LORATADINE 10 MG PO TABS
10.0000 mg | ORAL_TABLET | Freq: Every day | ORAL | Status: DC
  Administered 2017-01-10: 10 mg via ORAL
  Filled 2017-01-09: qty 1

## 2017-01-09 MED ORDER — MORPHINE SULFATE (PF) 4 MG/ML IV SOLN
INTRAVENOUS | Status: AC
  Filled 2017-01-09: qty 1

## 2017-01-09 MED ORDER — OXYCODONE-ACETAMINOPHEN 5-325 MG PO TABS
1.0000 | ORAL_TABLET | ORAL | Status: DC | PRN
  Administered 2017-01-09 – 2017-01-10 (×6): 2 via ORAL
  Filled 2017-01-09 (×6): qty 2

## 2017-01-09 MED ORDER — SODIUM CHLORIDE 0.9% FLUSH
3.0000 mL | Freq: Two times a day (BID) | INTRAVENOUS | Status: DC
  Administered 2017-01-09: 3 mL via INTRAVENOUS

## 2017-01-09 MED ORDER — THROMBIN 20000 UNITS EX KIT
PACK | CUTANEOUS | Status: DC | PRN
  Administered 2017-01-09: 20000 [IU] via TOPICAL

## 2017-01-09 MED ORDER — DIAZEPAM 5 MG PO TABS
5.0000 mg | ORAL_TABLET | Freq: Four times a day (QID) | ORAL | Status: DC | PRN
  Administered 2017-01-09 – 2017-01-10 (×3): 5 mg via ORAL
  Filled 2017-01-09 (×3): qty 1

## 2017-01-09 MED ORDER — PROPOFOL 10 MG/ML IV BOLUS
INTRAVENOUS | Status: DC | PRN
  Administered 2017-01-09: 40 mg via INTRAVENOUS
  Administered 2017-01-09: 30 mg via INTRAVENOUS
  Administered 2017-01-09: 130 mg via INTRAVENOUS

## 2017-01-09 MED ORDER — BUPIVACAINE-EPINEPHRINE 0.25% -1:200000 IJ SOLN
INTRAMUSCULAR | Status: DC | PRN
  Administered 2017-01-09: 10 mL

## 2017-01-09 MED ORDER — ACETAMINOPHEN 325 MG PO TABS: 650.0000 mg | ORAL_TABLET | ORAL | Status: DC | PRN

## 2017-01-09 SURGICAL SUPPLY — 85 items
ACIS 14 mm pin ×4 IMPLANT
APL SKNCLS STERI-STRIP NONHPOA (GAUZE/BANDAGES/DRESSINGS) ×1
BENZOIN TINCTURE PRP APPL 2/3 (GAUZE/BANDAGES/DRESSINGS) ×3 IMPLANT
BIT DRILL NEURO 2X3.1 SFT TUCH (MISCELLANEOUS) ×1 IMPLANT
BIT DRILL SKYLINE 16MM (BIT) IMPLANT
BLADE CLIPPER SURG (BLADE) ×3 IMPLANT
BLADE SURG 15 STRL LF DISP TIS (BLADE) ×1 IMPLANT
BLADE SURG 15 STRL SS (BLADE) ×3
BONE VIVIGEN FORMABLE 1.3CC (Bone Implant) ×3 IMPLANT
BUR MATCHSTICK NEURO 3.0 LAGG (BURR) IMPLANT
CARTRIDGE OIL MAESTRO DRILL (MISCELLANEOUS) ×1 IMPLANT
CLOSURE WOUND 1/2 X4 (GAUZE/BANDAGES/DRESSINGS) ×2
COLLAR CERV LO CONTOUR FIRM DE (SOFTGOODS) IMPLANT
CORDS BIPOLAR (ELECTRODE) ×3 IMPLANT
COVER SURGICAL LIGHT HANDLE (MISCELLANEOUS) ×3 IMPLANT
CRADLE DONUT ADULT HEAD (MISCELLANEOUS) ×3 IMPLANT
DECANTER SPIKE VIAL GLASS SM (MISCELLANEOUS) ×3 IMPLANT
DIFFUSER DRILL AIR PNEUMATIC (MISCELLANEOUS) ×3 IMPLANT
DRAIN JACKSON RD 7FR 3/32 (WOUND CARE) IMPLANT
DRAPE C-ARM 42X72 X-RAY (DRAPES) ×3 IMPLANT
DRAPE POUCH INSTRU U-SHP 10X18 (DRAPES) ×3 IMPLANT
DRAPE SURG 17X23 STRL (DRAPES) ×12 IMPLANT
DRILL NEURO 2X3.1 SOFT TOUCH (MISCELLANEOUS) ×3
DRILL SKYLINE 16MM (BIT) ×3
DURAPREP 26ML APPLICATOR (WOUND CARE) ×3 IMPLANT
ELECT COATED BLADE 2.86 ST (ELECTRODE) ×3 IMPLANT
ELECT REM PT RETURN 9FT ADLT (ELECTROSURGICAL) ×3
ELECTRODE REM PT RTRN 9FT ADLT (ELECTROSURGICAL) ×1 IMPLANT
EVACUATOR SILICONE 100CC (DRAIN) IMPLANT
GAUZE SPONGE 4X4 12PLY STRL (GAUZE/BANDAGES/DRESSINGS) ×3 IMPLANT
GAUZE SPONGE 4X4 16PLY XRAY LF (GAUZE/BANDAGES/DRESSINGS) ×3 IMPLANT
GLOVE BIO SURGEON STRL SZ7 (GLOVE) ×3 IMPLANT
GLOVE BIO SURGEON STRL SZ8 (GLOVE) ×5 IMPLANT
GLOVE BIOGEL PI IND STRL 7.0 (GLOVE) ×2 IMPLANT
GLOVE BIOGEL PI IND STRL 8 (GLOVE) ×1 IMPLANT
GLOVE BIOGEL PI INDICATOR 7.0 (GLOVE) ×4
GLOVE BIOGEL PI INDICATOR 8 (GLOVE) ×2
GLOVE SURG SS PI 7.0 STRL IVOR (GLOVE) ×2 IMPLANT
GOWN STRL REUS W/ TWL LRG LVL3 (GOWN DISPOSABLE) ×1 IMPLANT
GOWN STRL REUS W/ TWL XL LVL3 (GOWN DISPOSABLE) ×1 IMPLANT
GOWN STRL REUS W/TWL LRG LVL3 (GOWN DISPOSABLE) ×12
GOWN STRL REUS W/TWL XL LVL3 (GOWN DISPOSABLE) ×3
GRAFT BNE MATRIX VG FRMBL SM 1 (Bone Implant) IMPLANT
INTERLOCK LRDTC CRVCL VBR 7MM (Bone Implant) IMPLANT
IV CATH 14GX2 1/4 (CATHETERS) ×3 IMPLANT
KIT BASIN OR (CUSTOM PROCEDURE TRAY) ×3 IMPLANT
KIT ROOM TURNOVER OR (KITS) ×3 IMPLANT
LORDOTIC CERVICAL VBR 7MM SM (Bone Implant) ×6 IMPLANT
MANIFOLD NEPTUNE II (INSTRUMENTS) ×3 IMPLANT
NDL HYPO 25GX1X1/2 BEV (NEEDLE) IMPLANT
NDL PRECISIONGLIDE 27X1.5 (NEEDLE) ×1 IMPLANT
NDL SPNL 20GX3.5 QUINCKE YW (NEEDLE) ×1 IMPLANT
NEEDLE HYPO 25GX1X1/2 BEV (NEEDLE) ×3 IMPLANT
NEEDLE PRECISIONGLIDE 27X1.5 (NEEDLE) ×3 IMPLANT
NEEDLE SPNL 20GX3.5 QUINCKE YW (NEEDLE) ×3 IMPLANT
NS IRRIG 1000ML POUR BTL (IV SOLUTION) ×3 IMPLANT
OIL CARTRIDGE MAESTRO DRILL (MISCELLANEOUS) ×3
PACK ORTHO CERVICAL (CUSTOM PROCEDURE TRAY) ×3 IMPLANT
PAD ARMBOARD 7.5X6 YLW CONV (MISCELLANEOUS) ×6 IMPLANT
PATTIES SURGICAL .5 X.5 (GAUZE/BANDAGES/DRESSINGS) IMPLANT
PATTIES SURGICAL .5 X1 (DISPOSABLE) ×3 IMPLANT
PEN SKIN MARKING BROAD (MISCELLANEOUS) ×2 IMPLANT
PIN DISTRACTION 14 (PIN) ×2 IMPLANT
PLATE TWO LEVEL SKYLINE 30MM (Plate) ×2 IMPLANT
PUTTY DBX 1CC (Putty) ×3 IMPLANT
PUTTY DBX 1CC DEPUY (Putty) IMPLANT
SCREW RESCUE SKYLINE 16MM (Screw) ×12 IMPLANT
SLEEVE SURGEON STRL (DRAPES) ×2 IMPLANT
SPONGE INTESTINAL PEANUT (DISPOSABLE) ×3 IMPLANT
SPONGE SURGIFOAM ABS GEL 100 (HEMOSTASIS) ×3 IMPLANT
STRIP CLOSURE SKIN 1/2X4 (GAUZE/BANDAGES/DRESSINGS) ×3 IMPLANT
SURGIFLO W/THROMBIN 8M KIT (HEMOSTASIS) IMPLANT
SUT MNCRL AB 4-0 PS2 18 (SUTURE) ×3 IMPLANT
SUT SILK 4 0 (SUTURE)
SUT SILK 4-0 18XBRD TIE 12 (SUTURE) IMPLANT
SUT VIC AB 2-0 CT2 18 VCP726D (SUTURE) ×3 IMPLANT
SYR BULB IRRIGATION 50ML (SYRINGE) ×3 IMPLANT
SYR CONTROL 10ML LL (SYRINGE) ×9 IMPLANT
TAPE CLOTH 4X10 WHT NS (GAUZE/BANDAGES/DRESSINGS) ×3 IMPLANT
TAPE CLOTH SURG 4X10 WHT LF (GAUZE/BANDAGES/DRESSINGS) ×2 IMPLANT
TAPE UMBILICAL COTTON 1/8X30 (MISCELLANEOUS) ×5 IMPLANT
TOWEL OR 17X24 6PK STRL BLUE (TOWEL DISPOSABLE) ×3 IMPLANT
TOWEL OR 17X26 10 PK STRL BLUE (TOWEL DISPOSABLE) ×3 IMPLANT
WATER STERILE IRR 1000ML POUR (IV SOLUTION) ×3 IMPLANT
YANKAUER SUCT BULB TIP NO VENT (SUCTIONS) ×3 IMPLANT

## 2017-01-09 NOTE — Progress Notes (Signed)
Orthopedic Tech Progress Note Patient Details:  Andrew Fisher [birth year] [id number]  Patient ID: Andrew Fisher, male   DOB: [birth year], 61 y.o.   MRN: [id number]   Nikki DomCrawford, [name] 01/09/2017, 12:44 PM Viewed order from doctor's order list

## 2017-01-09 NOTE — Anesthesia Procedure Notes (Signed)
Procedure Name: Intubation Date/Time: 01/09/2017 7:48 AM Performed by: Marcene DuosFITZGERALD, ROBERT Pre-anesthesia Checklist: Patient identified, Emergency Drugs available, Suction available and Patient being monitored Patient Re-evaluated:Patient Re-evaluated prior to induction Oxygen Delivery Method: Circle System Utilized Preoxygenation: Pre-oxygenation with 100% oxygen Induction Type: IV induction Ventilation: Two handed mask ventilation required and Oral airway inserted - appropriate to patient size Laryngoscope Size: Glidescope and 4 Grade View: Grade II Tube type: Oral Tube size: 7.0 mm Number of attempts: 3 Airway Equipment and Method: Stylet,  Oral airway and Video-laryngoscopy Placement Confirmation: ETT inserted through vocal cords under direct vision,  positive ETCO2 and breath sounds checked- equal and bilateral Secured at: 22 cm Tube secured with: Tape Dental Injury: Teeth and Oropharynx as per pre-operative assessment  Comments: VLx1 by CRNA with Glidescope 3, grade II view; VLx2 by CRNA with Glidescope 4, grade IV view; VLx3 by MD with Glidescope 4, grade II view, atraumatic intubation of oral 7.0 cuffed ETT. BBS=, +ETCO2, tube secured. Patient masked between all attempts.

## 2017-01-09 NOTE — H&P (Signed)
PREOPERATIVE H&P  Chief Complaint: Bilateral arm pain  HPI: Andrew Fisher is a 61 y.o. male who presents with ongoing pain in the bilateral arms  MRI reveals stenosis at C4/5 and C5/6  Patient has failed multiple forms of conservative care and continues to have pain (see office notes for additional details regarding the patient's full course of treatment)  Past Medical History:  Diagnosis Date  . Arthritis   . Asthma   . GERD (gastroesophageal reflux disease)   . Hearing loss   . Hemorrhoids   . Hyperlipidemia   . Low testosterone in male   . Peripheral neuropathy   . Sleep apnea    wears cpap  . Sore throat   . Wears glasses   . Wears hearing aid in both ears    Past Surgical History:  Procedure Laterality Date  . APPENDECTOMY    . COLONOSCOPY    . NECK SURGERY    . SHOULDER SURGERY  2005   rotator cuff and tendons repaired   Social History   Social History  . Marital status: Married    Spouse name: N/A  . Number of children: N/A  . Years of education: N/A   Social History Main Topics  . Smoking status: Former Games developermoker  . Smokeless tobacco: Never Used  . Alcohol use 1.2 oz/week    2 Glasses of wine per week     Comment: 5 times a week  . Drug use: No  . Sexual activity: Not Asked   Other Topics Concern  . None   Social History Narrative  . None   History reviewed. No pertinent family history. No Known Allergies Prior to Admission medications   Medication Sig Start Date End Date Taking? Authorizing Provider  albuterol (PROAIR HFA) 108 (90 Base) MCG/ACT inhaler Inhale 1-2 puffs into the lungs every 6 (six) hours as needed for wheezing or shortness of breath.   Yes [provider]  aspirin EC 81 MG tablet Take 81 mg by mouth daily.   Yes [provider]  atorvastatin (LIPITOR) 10 MG tablet Take 10 mg by mouth daily.   Yes [provider]  diclofenac (VOLTAREN) 75 MG EC tablet Take 75 mg by mouth 2 (two) times daily.    Yes [provider]  ergocalciferol (VITAMIN D2) 50000 units capsule Take 50,000 Units by mouth See admin instructions. Take [id number] units by mouth Tuesday and Friday.   Yes [provider]  gabapentin (NEURONTIN) 300 MG capsule Take 300 mg by mouth 4 (four) times daily.   Yes [provider]  HYDROcodone-acetaminophen (NORCO/VICODIN) 5-325 MG tablet Take 1 tablet by mouth daily as needed for moderate pain.   Yes [provider]  lansoprazole (PREVACID) 30 MG capsule Take 30 mg by mouth daily at 12 noon.   Yes [provider]  loratadine (CLARITIN) 10 MG tablet Take 10 mg by mouth daily.   Yes [provider]  montelukast (SINGULAIR) 10 MG tablet Take 10 mg by mouth once.   Yes [provider]  Multiple Vitamins-Minerals (MULTIVITAMIN PO) Take 1 tablet by mouth daily.   Yes [provider]  tamsulosin (FLOMAX) 0.4 MG CAPS capsule Take 0.4 mg by mouth daily.    Yes [provider]  Testosterone (AXIRON) 30 MG/ACT SOLN Place 1 application onto the skin daily. *Underarm   Yes [provider]  diclofenac (FLECTOR) 1.3 % PTCH Place 1 patch onto the skin daily as needed (for pain).  [provider]  meclizine (ANTIVERT) 12.5 MG tablet Take 1 tablet (12.5 mg total) by mouth 3 (three) times daily as needed for dizziness. Patient not taking: Reported on 01/02/2017 06/20/15   Rancour, Jeannett Senior, MD     All other systems have been reviewed and were otherwise negative with the exception of those mentioned in the HPI and as above.  Physical Exam: Vitals:   01/09/17 0604  BP: (!) 151/89  Pulse: 76  Resp: 20  Temp: 98 F (36.7 C)  SpO2: 97%    General: Alert, no acute distress Cardiovascular: No pedal edema Respiratory: No cyanosis, no use of accessory musculature Skin: No lesions in the area of chief complaint Neurologic: Sensation intact distally Psychiatric: Patient is competent for consent with  normal mood and affect Lymphatic: No axillary or cervical lymphadenopathy   Assessment/Plan: Bilateral arm pain Plan for Procedure(s): ANTERIOR CERVICAL DECOMPRESSION FUSION, CERVICAL 4-5, CERVICAL 5-6 INSTRUMENTATION AND ALLOGRAFT;   Emilee Hero, MD 01/09/2017 6:35 AM

## 2017-01-09 NOTE — Anesthesia Postprocedure Evaluation (Signed)
Anesthesia Post Note  Patient: Andrew Fisher  Procedure(s) Performed: ANTERIOR CERVICAL DECOMPRESSION FUSION, CERVICAL FOUR-FIVE, CERVICAL FIVE-SIX INSTRUMENTATION AND ALLOGRAFT (N/A )     Patient location during evaluation: PACU Anesthesia Type: General Level of consciousness: awake and alert Pain management: pain level controlled Vital Signs Assessment: post-procedure vital signs reviewed and stable Respiratory status: spontaneous breathing, nonlabored ventilation, respiratory function stable and patient connected to nasal cannula oxygen Cardiovascular status: blood pressure returned to baseline and stable Postop Assessment: no apparent nausea or vomiting Anesthetic complications: no    Last Vitals:  Vitals:   01/09/17 1145 01/09/17 1226  BP:  (!) 157/103  Pulse: 82 80  Resp: 12 18  Temp: 36.7 C 36.8 C  SpO2: 99% 97%    Last Pain:  Vitals:   01/09/17 1145  TempSrc:   PainSc: 0-No pain                 Kennieth RadFitzgerald, [name]

## 2017-01-09 NOTE — Op Note (Signed)
NAME:  Karie SodaMCMICHAEL, Ercole            ACCOUNT NO.:  [id number]  MEDICAL RECORD NO.:  [id number]  LOCATION:  [facility]                         FACILITY:  [facility]  PHYSICIAN:  Estill BambergMark Taiga Lupinacci, MD      DATE OF BIRTH:  1955/11/23  DATE OF PROCEDURE:  01/09/2017                              OPERATIVE REPORT   PREOPERATIVE DIAGNOSES: 1. Bilateral cervical radiculopathy. 2. Status post previous C3-4 anterior cervical discectomy and fusion     12 years ago by another provider. 3. Severe degenerative disk disease with stenosis involving C4-5 and     C5-6, the 2 levels below the patient's previous fusion.  POSTOPERATIVE DIAGNOSES: 1. Bilateral cervical radiculopathy. 2. Status post previous C3-4 anterior cervical discectomy and fusion     12 years ago by another provider. 3. Severe degenerative disk disease with stenosis involving C4-5 and     C5-6, the 2 levels below the patient's previous fusion.  PROCEDURE: 1. Complex anterior cervical decompression and fusion, C4-5 and C5-6. 2. Placement of anterior instrumentation, C4-C6. 3. Insertion of interbody device x2 (Titan intervertebral spacers). 4. Intraoperative use of fluoroscopy. 5. Use of morselized allograft - ViviGen. 6. Removal of previously placed anterior instrumentation, C3-4.  SURGEON:  Estill BambergMark Havana Baldwin, MD.  ASSISTANJason Coop:  Kayla McKenzie, PA-C.  ANESTHESIA:  General endotracheal anesthesia.  COMPLICATIONS:  None.  DISPOSITION:  Stable.  ESTIMATED BLOOD LOSS:  Minimal.  INDICATIONS FOR SURGERY:  Briefly, Mr. Darreld McleanMcMichael is a pleasant 61 year old male, who did present to me with pain, weakness, and numbness involving his bilateral arms.  The patient's MRI did reveal the findings noted above.  We did proceed with appropriate nonoperative measures, including physical therapy and an epidural injection.  The patient's pain, however, did continue despite appropriate conservative treatment measures.  Given the patient's ongoing pain and  dysfunction, we did discuss proceeding with the procedure noted above.  The patient was fully aware of the risks and limitations of surgery and did elect to proceed.  OPERATIVE DETAILS:  On January 09, 2017, the patient was brought to surgery and general endotracheal anesthesia was administered.  The patient was placed supine on the hospital bed.  The patient's neck was gently extended.  The neck was prepped and draped in the usual fashion and a time-out procedure was performed.  A right-sided transverse incision was then made in line with the C5 vertebral body.  The platysma was incised.  As this was a revision procedure, there was significant scar tissue with significant adhesions noted along the anterior spine. I was able to ultimately identify the previously placed anterior instrumentation across the C3-4 level.  There were osteophytes circumferentially around the instrumentation, which did make removal difficult; however, I was able to remove the C3 and C4 vertebral body screws and the anterior plate.  Bone wax was placed in the previous screw holes.  I then placed a self-retaining retractor.  Of note, identification of the C4-5 and C5-6 intervertebral spaces was very difficult, as there was very significant anterior osteophyte hypertrophy identified, making access to the C4-5 and C5-6 intervertebral space was very difficult and time consuming.  Typically, the exposure portion of the procedure takes approximately 5-10 minutes, whereas in this particular  procedure, it did take approximately 45 minutes, as I did have to remove substantial anterior osteophytes in order to gain access to both the C4-5, and in particular, the C5-6 intervertebral space.  A self-retaining retractor was then placed.  After removing extensive osteophytes anteriorly, I did perform a thorough and complete C5-6 intervertebral diskectomy.  The intervertebral disk was thoroughly removed, and a bilateral  neuroforaminal decompression was performed. After preparing the endplates, the appropriate size intervertebral spacer was packed with ViviGen and tamped into position in the usual fashion.  I was very pleased with the press-fit of the implant.  I then turned my attention toward the C4-5 intervertebral space.  Once again, after removing extensive osteophytes anteriorly, I did perform a thorough and complete C4-5 intervertebral diskectomy, after which point, the endplates were prepared and the appropriate size intervertebral spacer was packed with ViviGen and tamped into position in the usual fashion.  Once again, I was very pleased with the decompression that I was able to accomplish and I was very pleased with the press-fit of the Titan intervertebral spacer at both the C4-5 and C5-6 intervertebral spaces.  I was very pleased with the fluoroscopic imaging.  At this point, the appropriate-sized anterior cervical plate was placed over the anterior spine.  The 16 mm screws were placed, 2 in each vertebral body at C4, C5, and C6 for a total of 6 vertebral body screws.  The screws were then locked to the plate using the Cam locking mechanism.  The wound was then copiously irrigated.  The wound was then closed in layers using 2-0 Vicryl, followed by 4-0 Monocryl.  Benzoin and Steri-Strips were applied, followed by sterile dressing.  All instrument counts were correct at the termination of the procedure.  Of note, Jason Coop, PA-C, was my assistant throughout surgery, and did aid in retraction, suctioning, and closure from start to finish.     Estill Bamberg, MD     MD/MEDQ  D:  01/09/2017  T:  01/09/2017  Job:  [id number]  cc:   Candyce Churn. Allyne Gee, M.D.

## 2017-01-09 NOTE — Progress Notes (Signed)
Orthopedic Tech Progress Note Patient Details:  Andrew Fisher 1956-01-18 [id number]  Ortho Devices Type of Ortho Device: [place] cervical collar Ortho Device/Splint Location: neck Ortho Device/Splint Interventions: [name]   [name] 01/09/2017, 12:44 PM

## 2017-01-09 NOTE — Transfer of Care (Signed)
Immediate Anesthesia Transfer of Care Note  Patient: Andrew Fisher  Procedure(s) Performed: ANTERIOR CERVICAL DECOMPRESSION FUSION, CERVICAL FOUR-FIVE, CERVICAL FIVE-SIX INSTRUMENTATION AND ALLOGRAFT (N/A )  Patient Location: PACU  Anesthesia Type:General  Level of Consciousness: awake, alert , oriented and patient cooperative  Airway & Oxygen Therapy: Patient Spontanous Breathing and Patient connected to nasal cannula oxygen  Post-op Assessment: Report given to RN and Post -op Vital signs reviewed and stable  Post vital signs: Reviewed and stable  Last Vitals:  Vitals:   01/09/17 0604  BP: (!) 151/89  Pulse: 76  Resp: 20  Temp: 36.7 C  SpO2: 97%    Last Pain:  Vitals:   01/09/17 0604  TempSrc: Oral      Patients Stated Pain Goal: 2 (01/09/17 0603)  Complications: No apparent anesthesia complications

## 2017-01-10 DIAGNOSIS — M50121 Cervical disc disorder at C4-C5 level with radiculopathy: Secondary | ICD-10-CM | POA: Diagnosis not present

## 2017-01-10 NOTE — Progress Notes (Signed)
    Patient doing well Minimal neck pain Has been ambulating and tolerating PO   Physical Exam: Vitals:   01/09/17 2343 01/10/17 0400  BP: (!) 162/85 (!) 156/79  Pulse: 86 84  Resp: 20 20  Temp: 98.4 F (36.9 C) 98.4 F (36.9 C)  SpO2: 97% 98%   Neck soft/supple Dressing in place NVI  POD #1 s/p C4-6 ACDF, doing well  - encourage ambulation - Percocet for pain, Valium for muscle spasms - likely d/c home later today with f/u in 2 weeks

## 2017-01-10 NOTE — Progress Notes (Signed)
Patient is discharged from room 3C02 at this time. Alert and in stable condition. IV site d/c'd and instructions read to patient and wife with understanding verbalized. Left unit via wheelchair with all belongings at side. 

## 2017-01-11 ENCOUNTER — Emergency Department (HOSPITAL_COMMUNITY): Payer: 59

## 2017-01-11 ENCOUNTER — Ambulatory Visit (HOSPITAL_COMMUNITY)
Admission: EM | Admit: 2017-01-11 | Discharge: 2017-01-12 | Disposition: A | Discharge status: Home / Self Care | Payer: 59 | Attending: Orthopedic Surgery | Admitting: Orthopedic Surgery

## 2017-01-11 ENCOUNTER — Encounter (HOSPITAL_COMMUNITY): Payer: Self-pay | Admitting: Emergency Medicine

## 2017-01-11 DIAGNOSIS — Y838 Other surgical procedures as the cause of abnormal reaction of the patient, or of later complication, without mention of misadventure at the time of the procedure: Secondary | ICD-10-CM | POA: Insufficient documentation

## 2017-01-11 DIAGNOSIS — J45909 Unspecified asthma, uncomplicated: Secondary | ICD-10-CM | POA: Diagnosis not present

## 2017-01-11 DIAGNOSIS — E785 Hyperlipidemia, unspecified: Secondary | ICD-10-CM | POA: Diagnosis not present

## 2017-01-11 DIAGNOSIS — Z7982 Long term (current) use of aspirin: Secondary | ICD-10-CM | POA: Insufficient documentation

## 2017-01-11 DIAGNOSIS — Z79899 Other long term (current) drug therapy: Secondary | ICD-10-CM | POA: Insufficient documentation

## 2017-01-11 DIAGNOSIS — R0602 Shortness of breath: Secondary | ICD-10-CM

## 2017-01-11 DIAGNOSIS — Z87891 Personal history of nicotine dependence: Secondary | ICD-10-CM | POA: Diagnosis not present

## 2017-01-11 DIAGNOSIS — Z981 Arthrodesis status: Secondary | ICD-10-CM | POA: Diagnosis not present

## 2017-01-11 DIAGNOSIS — M96840 Postprocedural hematoma of a musculoskeletal structure following a musculoskeletal system procedure: Secondary | ICD-10-CM | POA: Insufficient documentation

## 2017-01-11 DIAGNOSIS — K219 Gastro-esophageal reflux disease without esophagitis: Secondary | ICD-10-CM | POA: Diagnosis not present

## 2017-01-11 DIAGNOSIS — G4733 Obstructive sleep apnea (adult) (pediatric): Secondary | ICD-10-CM | POA: Insufficient documentation

## 2017-01-11 DIAGNOSIS — G629 Polyneuropathy, unspecified: Secondary | ICD-10-CM | POA: Insufficient documentation

## 2017-01-11 DIAGNOSIS — S1093XA Contusion of unspecified part of neck, initial encounter: Secondary | ICD-10-CM | POA: Diagnosis present

## 2017-01-11 LAB — BASIC METABOLIC PANEL
ANION GAP: 9 (ref 5–15)
BUN: 13 mg/dL (ref 6–20)
CALCIUM: 8.9 mg/dL (ref 8.9–10.3)
CO2: 26 mmol/L (ref 22–32)
Chloride: 102 mmol/L (ref 101–111)
Creatinine, Ser: 1.17 mg/dL (ref 0.61–1.24)
Glucose, Bld: 112 mg/dL — ABNORMAL HIGH (ref 65–99)
POTASSIUM: 3.8 mmol/L (ref 3.5–5.1)
Sodium: 137 mmol/L (ref 135–145)

## 2017-01-11 LAB — BRAIN NATRIURETIC PEPTIDE: B NATRIURETIC PEPTIDE 5: 18.4 pg/mL (ref 0.0–100.0)

## 2017-01-11 LAB — CBC
HEMATOCRIT: 42.8 % (ref 39.0–52.0)
Hemoglobin: 13.9 g/dL (ref 13.0–17.0)
MCH: 21.5 pg — ABNORMAL LOW (ref 26.0–34.0)
MCHC: 32.5 g/dL (ref 30.0–36.0)
MCV: 66.3 fL — ABNORMAL LOW (ref 78.0–100.0)
PLATELETS: 164 10*3/uL (ref 150–400)
RBC: 6.46 MIL/uL — AB (ref 4.22–5.81)
RDW: 19.9 % — AB (ref 11.5–15.5)
WBC: 7.3 10*3/uL (ref 4.0–10.5)

## 2017-01-11 LAB — I-STAT TROPONIN, ED: Troponin i, poc: 0.02 ng/mL (ref 0.00–0.08)

## 2017-01-11 MED ORDER — ALBUTEROL SULFATE (2.5 MG/3ML) 0.083% IN NEBU
INHALATION_SOLUTION | RESPIRATORY_TRACT | Status: AC
  Filled 2017-01-11: qty 6

## 2017-01-11 MED ORDER — ZOLPIDEM TARTRATE 5 MG PO TABS
5.0000 mg | ORAL_TABLET | Freq: Every evening | ORAL | Status: DC | PRN
  Administered 2017-01-12: 5 mg via ORAL
  Filled 2017-01-11: qty 1

## 2017-01-11 MED ORDER — DOCUSATE SODIUM 100 MG PO CAPS
100.0000 mg | ORAL_CAPSULE | Freq: Two times a day (BID) | ORAL | Status: DC
  Administered 2017-01-11 – 2017-01-12 (×2): 100 mg via ORAL
  Filled 2017-01-11 (×2): qty 1

## 2017-01-11 MED ORDER — ACETAMINOPHEN 325 MG PO TABS: 650.0000 mg | ORAL_TABLET | ORAL | Status: DC | PRN

## 2017-01-11 MED ORDER — HYDROMORPHONE HCL 1 MG/ML IJ SOLN: 0.5000 mg | INTRAMUSCULAR | Status: DC | PRN

## 2017-01-11 MED ORDER — DEXAMETHASONE SODIUM PHOSPHATE 4 MG/ML IJ SOLN
10.0000 mg | Freq: Three times a day (TID) | INTRAMUSCULAR | Status: DC
  Administered 2017-01-11 – 2017-01-12 (×2): 10 mg via INTRAVENOUS
  Filled 2017-01-11 (×2): qty 3

## 2017-01-11 MED ORDER — OXYCODONE HCL 5 MG PO TABS: 5.0000 mg | ORAL_TABLET | ORAL | Status: DC | PRN

## 2017-01-11 MED ORDER — DEXAMETHASONE SODIUM PHOSPHATE 10 MG/ML IJ SOLN
10.0000 mg | Freq: Once | INTRAMUSCULAR | Status: AC
  Administered 2017-01-11: 10 mg via INTRAVENOUS
  Filled 2017-01-11: qty 1

## 2017-01-11 MED ORDER — IOPAMIDOL (ISOVUE-370) INJECTION 76%
INTRAVENOUS | Status: AC
  Administered 2017-01-11: 80 mL
  Filled 2017-01-11: qty 100

## 2017-01-11 MED ORDER — ALBUTEROL SULFATE (2.5 MG/3ML) 0.083% IN NEBU
5.0000 mg | INHALATION_SOLUTION | Freq: Once | RESPIRATORY_TRACT | Status: AC
  Administered 2017-01-11: 5 mg via RESPIRATORY_TRACT

## 2017-01-11 MED ORDER — ONDANSETRON HCL 4 MG/2ML IJ SOLN: 4.0000 mg | Freq: Four times a day (QID) | INTRAMUSCULAR | Status: DC | PRN

## 2017-01-11 MED ORDER — MENTHOL 3 MG MT LOZG
1.0000 | LOZENGE | OROMUCOSAL | Status: DC | PRN
  Administered 2017-01-12 (×2): 3 mg via ORAL
  Filled 2017-01-11: qty 9

## 2017-01-11 MED ORDER — GUAIFENESIN ER 600 MG PO TB12
1200.0000 mg | ORAL_TABLET | Freq: Two times a day (BID) | ORAL | Status: DC | PRN
  Administered 2017-01-11 – 2017-01-12 (×2): 1200 mg via ORAL
  Filled 2017-01-11 (×2): qty 2

## 2017-01-11 MED ORDER — SODIUM CHLORIDE 0.9 % IV SOLN: INTRAVENOUS | Status: DC

## 2017-01-11 MED ORDER — ONDANSETRON HCL 4 MG PO TABS: 4.0000 mg | ORAL_TABLET | Freq: Four times a day (QID) | ORAL | Status: DC | PRN

## 2017-01-11 MED ORDER — ACETAMINOPHEN 650 MG RE SUPP: 650.0000 mg | RECTAL | Status: DC | PRN

## 2017-01-11 MED ORDER — PHENOL 1.4 % MT LIQD: 1.0000 | OROMUCOSAL | Status: DC | PRN

## 2017-01-11 NOTE — Progress Notes (Signed)
RT set up CPAP and placed on patient. Patient tolerating well at this time. RT will monitor as needed.  

## 2017-01-11 NOTE — H&P (Signed)
Subjective:  This is a 61 year old male who Is a patient of Dr. Ottis Stain had aC4-C6 ACDF 2 days ago.  He presented to the emergency room this morning with A sensation that he wakes up feeling like he cannot breathe with increased neck pain.  He presented to the emergency room and a CT scan was ordered which Showed appropriately placed hardware and as expected a hematoma. He had no difficulty swallowing.  He reports that he has not slept much since his surgery. He denies radicular symptoms.  Patient Active Problem List   Diagnosis Date Noted  . Radiculopathy 01/09/2017  . Hemorrhoids, internal 12/11/2010   Past Medical History:  Diagnosis Date  . Arthritis   . Asthma   . GERD (gastroesophageal reflux disease)   . Hearing loss   . Hemorrhoids   . Hyperlipidemia   . Low testosterone in male   . Peripheral neuropathy   . Sleep apnea    wears cpap  . Sore throat   . Wears glasses   . Wears hearing aid in both ears     Past Surgical History:  Procedure Laterality Date  . APPENDECTOMY    . COLONOSCOPY    . NECK SURGERY    . SHOULDER SURGERY  2005   rotator cuff and tendons repaired     (Not in a hospital admission) No Known Allergies  Social History  Substance Use Topics  . Smoking status: Former Games developer  . Smokeless tobacco: Never Used  . Alcohol use 1.2 oz/week    2 Glasses of wine per week     Comment: 5 times a week    No family history on file.  Review of Systems A comprehensive review of systems was negative.  Objective:  Patient Vitals for the past 8 hrs:  BP Temp Temp src Pulse Resp SpO2  01/11/17 1200 (!) 148/106 - - 83 12 95 %  01/11/17 1041 92/83 - - 87 16 98 %  01/11/17 0749 (!) 158/95 - - 79 16 99 %  01/11/17 0655 - - - - - 98 %  01/11/17 0655 - - - - - 96 %  01/11/17 0653 (!) 148/87 - - 79 12 -  01/11/17 0438 (!) 154/86 97.7 F (36.5 C) Oral 85 18 96 %   BP (!) 148/106   Pulse 83   Temp 97.7 F (36.5 C) (Oral)   Resp 12   Ht 5\' 8"  (1.727  m)   Wt 93.9 kg (207 lb)   SpO2 95%   BMI 31.47 kg/m   General Appearance:    Alert, cooperative, no distress, appears stated age  Head:    Normocephalic, without obvious abnormality, atraumatic  Eyes:    PERRL, conjunctiva/corneas clear, EOM's intact, fundi    benign, both eyes       Ears:    Normal TM's and external ear canals, both ears  Nose:   Nares normal, septum midline, mucosa normal, no drainage    or sinus tenderness  Throat:   Lips, mucosa, and tongue normal; teeth and gums normal     Back:     Symmetric, no curvature, ROM normal, no CVA tenderness  Lungs:     Clear to auscultation bilaterally, respirations unlabored  Chest wall:    No tenderness or deformity  Heart:    Regular rate and rhythm, S1 and S2 normal, no murmur, rub   or gallop  Abdomen:     Soft, non-tender, bowel sounds active all four quadrants,  no masses, no organomegaly        Extremities:   Extremities normal, atraumatic, no cyanosis or edema  Pulses:   2+ and symmetric all extremities  Skin:   Skin color, texture, turgor normal, no rashes or lesions  Lymph nodes:   Cervical, supraclavicular, and axillary nodes normal  Neurologic:   CNII-XII intact. Normal strength, sensation and reflexes      throughout  Neck/cervical spine exam: His wound is benign.  He has absolutely no tenderness about the trachea and the trachea is mobile as would be expected.  He is in no respiratory Distress.The neck is soft and supple.  Upper extremities are neurologically intact.  Cervical collar is in place.  He has no sensory or motor weakness in either upper extremity.  Study Result   CLINICAL DATA:  Shortness of breath since 01/10/2017. C4-5 and C5-6 ACDF performed 01/09/2017.  EXAM: CT NECK WITH CONTRAST  TECHNIQUE: Multidetector CT imaging of the neck was performed using the standard protocol following the bolus administration of intravenous contrast.  CONTRAST:  Isovue 370, 80 mL.  COMPARISON:  MRI  cervical spine 10/25/2016. Intraoperative radiograph 01/09/2017.  FINDINGS: Pharynx and larynx: There is a retropharyngeal fluid collection, relatively hypoattenuating, attenuation 30-40 HU, epicenter at the surgical site. Approximate cross-sectional measurements are 3 x 1.5 x 5 cm. Considerations include seroma, hematoma, or CSF leak. The hypopharynx, larynx, trachea, and esophagus are displaced anteriorly. Correlate clinically for stridor or wheezing.  Salivary glands: No inflammation, mass, or stone.  Thyroid: Normal.  Lymph nodes: None enlarged or abnormal density.  Vascular: Negative.  Limited intracranial: Negative.  Visualized orbits: Negative.  Mastoids and visualized paranasal sinuses: Clear.  Skeleton: Unremarkable appearing C4-5 and C5-6 ACDF. Previous C3-4 ACDF.  Upper chest: Reported separately.  Other: Mild fluid and air in the RIGHT neck along the surgical exploration site. This appearance is non worrisome.  IMPRESSION: Status post C4-C6 ACDF 2 days ago. Hypoattenuating retropharyngeal fluid collection at the surgical site, approximately 3 x 1.5 x 5 cm. Considerations include seroma, hematoma, or contained CSF leak. Displacement of the hypopharynx, airway, and esophagus anteriorly.  Given that the patient has low oxygen saturations intermittently, careful observation is recommended to the possibility that the patient may have an expanding fluid collection, which could result in airway obstruction.  These results were called by telephone at the time of interpretation on 01/11/2017 at 8:57 am to Dr. Frederick PeersACHEL LITTLE , who verbally acknowledged these results.   Electronically Signed   By: Elsie StainJohn T Curnes M.D.   On: 01/11/2017 08:59     Assessment: Status post anterior cervical fusion  C4-C6 with hematoma  Plan: The patient will be admitted overnight for close observation and administration of 10 mg of IV Decadron q. 8 hours 3  doses.Dr. Marshell Levanumonski's note is as stated: Patient presented to ED with SOB. I have spoken with Dr. Clarene DukeLittle and Dr. Luiz BlareGraves. Both have examined patient. Patient has reportedly been tolerating PO, and currently, patient is in no distress, and is ambulating, swallowing, and breathing without difficulty. Patient neck is reportedly soft and supple. I have also reviewed CT scan, which reveals appropriately-placed hardware, and as expected, a hematoma. Patient's clinical status would support being able to discharge him home, as he is breathing and tolerating PO without any difficulty. However, a decision was made to get the neck CT was that was obtained, and given the hematoma noted, which is not unexpected, but given that there there is clearly a hematoma in combination with  the fact that his neck surgery was a revision surgery, I do feel that it is most appropriate to observe him overnight to ensure that the hematoma does not increase in size, as this could cause a significant life-threatening airway compromise. As I discussed with Dr. Luiz Blare, we will admit the patient for close observation overnight, and will give 10mg  IV of decadron q 8 hours x 3 doses. As long as his exam is unchanged or improved by tomorrow morning, he can be safely discharged then, as I do believe that his swelling and hematoma has likely reached its maximum, and should improve from here, but we will observe him overnight to ensure that this is the case. If his respiratory status was to worsen, I would need to be notified emergently.

## 2017-01-11 NOTE — ED Notes (Signed)
Patient transported to CT 

## 2017-01-11 NOTE — ED Provider Notes (Signed)
[facility] EMERGENCY DEPARTMENT Provider Note   CSN: [id number] Arrival date & time: 01/11/17  0148     History   Chief Complaint Chief Complaint  Patient presents with  . Shortness of Breath    HPI Andrew Fisher is a 61 y.o. male.  61yo M w/ PMH including OSA, HLD, GERD, asthma who p/w shortness of breath. On 10/18, pt underwent elective cervical fusion. He was discharged home yesterday. He states that ever since he woke up from the surgery he has felt short of breath like he can't get enough air. He's unable to specify if his airway feels tight. SOB is worse laying flat and he's been unable to sleep. He tried using CPAP but felt like he was suffocating. He has had a significant nonproductive cough ever since he woke up from surgery. He denies any associated nasal congestion. He has had a mild sore throat but wife notes that he reportedly had difficulty with intubation requiring 3 attempts. No sick contacts, fevers, or vomiting. He was having severe post-surgical neck pain but took his pain medication earlier in the waiting room and it has helped.   The history is provided by the patient.  Shortness of Breath     Past Medical History:  Diagnosis Date  . Arthritis   . Asthma   . GERD (gastroesophageal reflux disease)   . Hearing loss   . Hemorrhoids   . Hyperlipidemia   . Low testosterone in male   . Peripheral neuropathy   . Sleep apnea    wears cpap  . Sore throat   . Wears glasses   . Wears hearing aid in both ears     Patient Active Problem List   Diagnosis Date Noted  . Radiculopathy 01/09/2017  . Hemorrhoids, internal 12/11/2010    Past Surgical History:  Procedure Laterality Date  . APPENDECTOMY    . COLONOSCOPY    . NECK SURGERY    . SHOULDER SURGERY  2005   rotator cuff and tendons repaired       Home Medications    Prior to Admission medications   Medication Sig Start Date End Date Taking? Authorizing Provider    albuterol (PROAIR HFA) 108 (90 Base) MCG/ACT inhaler Inhale 1-2 puffs into the lungs every 6 (six) hours as needed for wheezing or shortness of breath.   Yes [provider]  aspirin EC 81 MG tablet Take 81 mg by mouth daily.   Yes [provider]  atorvastatin (LIPITOR) 10 MG tablet Take 10 mg by mouth daily.   Yes [provider]  diazepam (VALIUM) 5 MG tablet Take 5 mg by mouth every 6 (six) hours as needed for anxiety.   Yes [provider]  ergocalciferol (VITAMIN D2) 50000 units capsule Take 50,000 Units by mouth See admin instructions. Take 11914 units by mouth Tuesday and Friday.   Yes [provider]  gabapentin (NEURONTIN) 300 MG capsule Take 300 mg by mouth 4 (four) times daily.   Yes [provider]  HYDROcodone-acetaminophen (NORCO/VICODIN) 5-325 MG tablet Take 1 tablet by mouth daily as needed for moderate pain.  01/06/17  Yes [provider]  lansoprazole (PREVACID) 30 MG capsule Take 30 mg by mouth daily at 12 noon.   Yes [provider]  loratadine (CLARITIN) 10 MG tablet Take 10 mg by mouth daily.   Yes [provider]  montelukast (SINGULAIR) 10 MG tablet Take 10 mg by mouth at bedtime.  Yes [provider]  Multiple Vitamins-Minerals (MULTIVITAMIN PO) Take 1 tablet by mouth daily.   Yes [provider]  oxyCODONE-acetaminophen (PERCOCET/ROXICET) 5-325 MG tablet Take 1 tablet by mouth every 4 (four) hours as needed for severe pain.   Yes [provider]  tamsulosin (FLOMAX) 0.4 MG CAPS capsule Take 0.4 mg by mouth daily.    Yes [provider]  Testosterone (AXIRON) 30 MG/ACT SOLN Place 1 application onto the skin daily. *Underarm   Yes [provider]  meclizine (ANTIVERT) 12.5 MG tablet Take 1 tablet (12.5 mg total) by mouth 3 (three) times daily as needed for dizziness. Patient not taking: Reported on 01/02/2017 06/20/15   Glynn Octaveancour, Stephen, MD     Family History No family history on file.  Social History Social History  Substance Use Topics  . Smoking status: Former Games developermoker  . Smokeless tobacco: Never Used  . Alcohol use 1.2 oz/week    2 Glasses of wine per week     Comment: 5 times a week     Allergies   Patient has no known allergies.   Review of Systems Review of Systems  Respiratory: Positive for shortness of breath.    All other systems reviewed and are negative except that which was mentioned in HPI   Physical Exam Updated Vital Signs BP (!) 148/87   Pulse 79   Temp 97.7 F (36.5 C) (Oral)   Resp 12   Ht 5\' 8"  (1.727 m)   Wt 93.9 kg (207 lb)   SpO2 98%   BMI 31.47 kg/m   Physical Exam  Constitutional: He is oriented to person, place, and time. He appears well-developed and well-nourished. No distress.  HENT:  Head: Normocephalic and atraumatic.  Moist mucous membranes  Eyes: Pupils are equal, round, and reactive to light. Conjunctivae are normal.  Neck:  In c-collar, bandage over anterior neck  Cardiovascular: Normal rate, regular rhythm and normal heart sounds.   No murmur heard. Pulmonary/Chest: Effort normal. No stridor.  Diminished BS b/l bases  Abdominal: Soft. Bowel sounds are normal. He exhibits no distension. There is no tenderness.  Musculoskeletal: He exhibits no edema.  Neurological: He is alert and oriented to person, place, and time.  Fluent speech  Skin: Skin is warm and dry.  Psychiatric: He has a normal mood and affect. Judgment normal.  Nursing note and vitals reviewed.    ED Treatments / Results  Labs (all labs ordered are listed, but only abnormal results are displayed) Labs Reviewed  CBC - Abnormal; Notable for the following:       Result Value   RBC 6.46 (*)    MCV 66.3 (*)    MCH 21.5 (*)    RDW 19.9 (*)    All other components within normal limits  BASIC METABOLIC PANEL - Abnormal; Notable for the following:    Glucose, Bld 112 (*)    All other  components within normal limits  BRAIN NATRIURETIC PEPTIDE  I-STAT TROPONIN, ED    EKG  EKG Interpretation  Date/Time:  Saturday January 11 2017 01:58:38 EDT Ventricular Rate:  90 PR Interval:  168 QRS Duration: 84 QT Interval:  324 QTC Calculation: 396 R Axis:   79 Text Interpretation:  Normal sinus rhythm Cannot rule out Inferior infarct , age undetermined Abnormal ECG When compared with ECG of 01/06/2017, HEART RATE has increased Confirmed by Dione BoozeGlick, David ([id number]) on 01/11/2017 6:39:55 AM       Radiology Dg Chest 2 View  Result Date: 01/11/2017 CLINICAL DATA:  Shortness of breath tonight. Discharged yesterday after a cervical fusion. EXAM: CHEST  2 VIEW COMPARISON:  01/06/2017 FINDINGS: Shallow inspiration. Linear opacities in the lung bases, greater on the right, and new since previous study. This could be due to atelectasis or pneumonia. No blunting of costophrenic angles. No pneumothorax. Heart size and pulmonary vascularity are normal. Thoracic scoliosis convex towards the right. Surgical changes in the cervical spine. IMPRESSION: Interval development of linear opacities in the lung bases suggesting either atelectasis or pneumonia. Electronically Signed   By: Burman Nieves M.D.   On: 01/11/2017 02:59   Dg Cervical Spine 1 View  Result Date: 01/09/2017 CLINICAL DATA:  ACDF C4-5 and C5-6 EXAM: CERVICAL SPINE 1 VIEW COMPARISON:  Cervical spine MRI 10/25/2016 FINDINGS: Single lateral view of the operating of the cervical spine. Pre-existing fusion C3-4 unchanged Interval ACDF with anterior plate and interbody spacers at C4-5 and C5-6 in good position. IMPRESSION: Satisfactory ACDF C4-5 and C5-6. Electronically Signed   By: Marlan Palau M.D.   On: 01/09/2017 10:54   Dg C-arm 1-60 Min  Result Date: 01/09/2017 CLINICAL DATA:  ACDF C4-5 and C5-6 EXAM: CERVICAL SPINE 1 VIEW COMPARISON:  Cervical spine MRI 10/25/2016 FINDINGS: Single lateral view of the operating of the cervical  spine. Pre-existing fusion C3-4 unchanged Interval ACDF with anterior plate and interbody spacers at C4-5 and C5-6 in good position. IMPRESSION: Satisfactory ACDF C4-5 and C5-6. Electronically Signed   By: Marlan Palau M.D.   On: 01/09/2017 10:54    Procedures Procedures (including critical care time)  Medications Ordered in ED Medications  albuterol (PROVENTIL) (2.5 MG/3ML) 0.083% nebulizer solution (not administered)  albuterol (PROVENTIL) (2.5 MG/3ML) 0.083% nebulizer solution 5 mg (5 mg Nebulization Given 01/11/17 0205)     Initial Impression / Assessment and Plan / ED Course  I have reviewed the triage vital signs and the nursing notes.  Pertinent labs & imaging results that were available during my care of the patient were reviewed by me and considered in my medical decision making (see chart for details).     PT w/ cervical fusion 2 days ago p/w shortness of breath since waking up from surgery. He was nontoxic on exam with reassuring vital signs. His nurse did note that his saturation drops every time he falls asleep and he had to be placed on supplemental oxygen.His chest x-ray shows atelectasis versus basilar pneumonia. Obtained CT for better evaluation as well as to rule out PE. Also obtained soft tissue neck given that his shortness of breath has been ever since waking up from surgery.   CTA chest negative. CT neck shows retropharyngeal fluid collection displacing hypopharynx, airway, and esophagus anteriorly. Although the patient has no stridor or respiratory distress on exam,he continues to desaturate quickly despite sitting straight at 90.i spoke with his primary surgeon Dr. Yevette Edwards, and pt evaluated by his colleague Dr. Luiz Blare. Ultimately we decided to give him Decadron and observed overnight to ensure no airway compromise given his repeated episodes of hypoxia and his ongoing complaint of breathing problems. Patient admitted for observation. Final Clinical Impressions(s) /  ED Diagnoses   Final diagnoses:  Shortness of breath    New Prescriptions New Prescriptions   No medications on file     Lylian Sanagustin, Ambrose Finland, MD 01/11/17 1643

## 2017-01-11 NOTE — ED Triage Notes (Signed)
Reports having a cervical fusion on Friday.  Reports having sob and increased pain.  Reports pain medication doesn't help the pain.  Breathing easy and nonlabored.

## 2017-01-11 NOTE — ED Notes (Signed)
Report attempted 

## 2017-01-11 NOTE — ED Notes (Addendum)
Pt. States having neck surgery yesterday. Pt. States he wakes up feeling like he cant breathe. While in the room, Pt. Fell asleep and SPO2 droped to 87%. Pt. Woke up to catch his breath and SPO2 went to 98%. Pt. Continued to drop so pt. Was placed on 2 L.

## 2017-01-11 NOTE — Progress Notes (Signed)
Patient presented to ED with SOB. I have spoken with Dr. Clarene DukeLittle and Dr. Luiz BlareGraves. Both have examined patient. Patient has reportedly been tolerating PO, and currently, patient is in no distress, and is ambulating, swallowing, and breathing without difficulty. Patient neck is reportedly soft and supple. I have also reviewed CT scan, which reveals appropriately-placed hardware, and as expected, a hematoma. Patient's clinical status would support being able to discharge him home, as he is breathing and tolerating PO without any difficulty. However, a decision was made to get the neck CT was that was obtained, and given the hematoma noted, which is not unexpected, but given that there there is clearly a hematoma in combination with the fact that his neck surgery was a revision surgery, I do feel that it is most appropriate to observe him overnight to ensure that the hematoma does not increase in size, as this could cause a significant life-threatening airway compromise. As I discussed with Dr. Luiz BlareGraves, we will admit the patient for close observation overnight, and will give 10mg  IV of decadron q 8 hours x 3 doses. As long as his exam is unchanged or improved by tomorrow morning, he can be safely discharged then, as I do believe that his swelling and hematoma has likely reached its maximum, and should improve from here, but we will observe him overnight to ensure that this is the case. If his respiratory status was to worsen, I would need to be notified emergently.

## 2017-01-11 NOTE — ED Notes (Signed)
Pt declined to order supper.

## 2017-01-12 DIAGNOSIS — M96840 Postprocedural hematoma of a musculoskeletal structure following a musculoskeletal system procedure: Secondary | ICD-10-CM | POA: Diagnosis not present

## 2017-01-12 MED ORDER — HYDRALAZINE HCL 20 MG/ML IJ SOLN
10.0000 mg | Freq: Three times a day (TID) | INTRAMUSCULAR | Status: DC | PRN
  Administered 2017-01-12: 10 mg via INTRAVENOUS
  Filled 2017-01-12: qty 1

## 2017-01-12 NOTE — Discharge Instructions (Signed)
Wear cervical collar at all times. You may loosen it at night to use your C Pap machine.

## 2017-01-12 NOTE — Discharge Summary (Signed)
Patient ID: Andrew Fisher MRN: [id number] DOB/AGE: 1955-12-07 61 y.o.  Admit date: 01/11/2017 Discharge date: 01/12/2017  Admission Diagnoses:  Principal Problem:   Hematoma of neck Active Problems:   S/P cervical spinal fusion   Discharge Diagnoses:  Same  Past Medical History:  Diagnosis Date  . Arthritis   . Asthma   . GERD (gastroesophageal reflux disease)   . Hearing loss   . Hemorrhoids   . Hyperlipidemia   . Low testosterone in male   . Peripheral neuropathy   . Sleep apnea    wears cpap  . Sore throat   . Wears glasses   . Wears hearing aid in both ears     Surgeries:  none   Consultants: none  Discharged Condition: Improved  Hospital Course: Andrew Fisher is an 61 y.o. male who was admitted 01/11/2017 for operative treatment ofHematoma of neck. The patient was admitted to the hospital with chief complaint of shortness of breath due to a sensation of swelling in his throat and some moderate difficulty swallowing. He was evaluated with CT scan which showed a small hematoma. He was admitted for close observation and administration of IV steroids. On the date of discharge he had resolution of his symptoms and was doing well wearing a cervical collar. He had no numbness or pain radiating into either upper extremity.  Patient was given perioperative antibiotics: Anti-infectives    None       Patient was given sequential compression devices and early ambulation to prevent DVT.  Patient benefited maximally from hospital stay and there were no complications.    Recent vital signs: Patient Vitals for the past 24 hrs:  BP Temp Temp src Pulse Resp SpO2  01/12/17 0821 (!) 163/105 97.9 F (36.6 C) Oral - 14 -  01/12/17 0410 - 98 F (36.7 C) Oral 91 - -  01/11/17 2324 (!) 152/94 97.9 F (36.6 C) Oral 79 14 -  01/11/17 2221 - - - 84 16 94 %  01/11/17 1915 (!) 160/108 - - 83 15 93 %  01/11/17 1830 (!) 137/112 - - 84 20 94 %  01/11/17 1622 (!) 138/97 - -  86 18 93 %  01/11/17 1545 (!) 138/97 - - 86 14 94 %  01/11/17 1500 (!) 146/91 - - 77 11 94 %  01/11/17 1400 (!) 157/102 - - 74 14 97 %  01/11/17 1353 (!) 159/105 - - 76 12 94 %  01/11/17 1308 (!) 145/97 (!) 97.5 F (36.4 C) Oral 85 16 99 %  01/11/17 1200 (!) 148/106 - - 83 12 95 %  01/11/17 1041 92/83 - - 87 16 98 %     Recent laboratory studies:  Recent Labs  01/11/17 0208  WBC 7.3  HGB 13.9  HCT 42.8  PLT 164  NA 137  K 3.8  CL 102  CO2 26  BUN 13  CREATININE 1.17  GLUCOSE 112*  CALCIUM 8.9     Discharge Medications:   Allergies as of 01/12/2017   No Known Allergies     Medication List    STOP taking these medications   aspirin EC 81 MG tablet   HYDROcodone-acetaminophen 5-325 MG tablet Commonly known as:  NORCO/VICODIN   meclizine 12.5 MG tablet Commonly known as:  ANTIVERT     TAKE these medications   atorvastatin 10 MG tablet Commonly known as:  LIPITOR Take 10 mg by mouth daily.   AXIRON 30 MG/ACT Soln Generic drug:  Testosterone Place  1 application onto the skin daily. *Underarm   diazepam 5 MG tablet Commonly known as:  VALIUM Take 5 mg by mouth every 6 (six) hours as needed for anxiety.   ergocalciferol 50000 units capsule Commonly known as:  VITAMIN D2 Take 50,000 Units by mouth See admin instructions. Take 16109 units by mouth Tuesday and Friday.   gabapentin 300 MG capsule Commonly known as:  NEURONTIN Take 300 mg by mouth 4 (four) times daily.   lansoprazole 30 MG capsule Commonly known as:  PREVACID Take 30 mg by mouth daily at 12 noon.   loratadine 10 MG tablet Commonly known as:  CLARITIN Take 10 mg by mouth daily.   montelukast 10 MG tablet Commonly known as:  SINGULAIR Take 10 mg by mouth at bedtime.   MULTIVITAMIN PO Take 1 tablet by mouth daily.   oxyCODONE-acetaminophen 5-325 MG tablet Commonly known as:  PERCOCET/ROXICET Take 1 tablet by mouth every 4 (four) hours as needed for severe pain.   PROAIR HFA 108  (90 Base) MCG/ACT inhaler Generic drug:  albuterol Inhale 1-2 puffs into the lungs every 6 (six) hours as needed for wheezing or shortness of breath.   tamsulosin 0.4 MG Caps capsule Commonly known as:  FLOMAX Take 0.4 mg by mouth daily.       Diagnostic Studies: Dg Chest 2 View  Result Date: 01/11/2017 CLINICAL DATA:  Shortness of breath tonight. Discharged yesterday after a cervical fusion. EXAM: CHEST  2 VIEW COMPARISON:  01/06/2017 FINDINGS: Shallow inspiration. Linear opacities in the lung bases, greater on the right, and new since previous study. This could be due to atelectasis or pneumonia. No blunting of costophrenic angles. No pneumothorax. Heart size and pulmonary vascularity are normal. Thoracic scoliosis convex towards the right. Surgical changes in the cervical spine. IMPRESSION: Interval development of linear opacities in the lung bases suggesting either atelectasis or pneumonia. Electronically Signed   By: Burman Nieves M.D.   On: 01/11/2017 02:59   Dg Chest 2 View  Result Date: 01/06/2017 CLINICAL DATA:  Preoperative evaluation for cervical spine surgery, history GERD, former smoker EXAM: CHEST  2 VIEW COMPARISON:  11/29/2015 FINDINGS: Normal heart size, mediastinal contours, and pulmonary vascularity. Minimal scarring RIGHT base. Lungs otherwise clear. No acute infiltrate, pleural effusion or pneumothorax. Bones unremarkable. IMPRESSION: Minimal RIGHT basilar scarring. No acute abnormalities. Electronically Signed   By: Ulyses Southward M.D.   On: 01/06/2017 15:44   Dg Cervical Spine 1 View  Result Date: 01/09/2017 CLINICAL DATA:  ACDF C4-5 and C5-6 EXAM: CERVICAL SPINE 1 VIEW COMPARISON:  Cervical spine MRI 10/25/2016 FINDINGS: Single lateral view of the operating of the cervical spine. Pre-existing fusion C3-4 unchanged Interval ACDF with anterior plate and interbody spacers at C4-5 and C5-6 in good position. IMPRESSION: Satisfactory ACDF C4-5 and C5-6. Electronically Signed    By: Marlan Palau M.D.   On: 01/09/2017 10:54   Ct Soft Tissue Neck W Contrast  Result Date: 01/11/2017 CLINICAL DATA:  Shortness of breath since 01/10/2017. C4-5 and C5-6 ACDF performed 01/09/2017. EXAM: CT NECK WITH CONTRAST TECHNIQUE: Multidetector CT imaging of the neck was performed using the standard protocol following the bolus administration of intravenous contrast. CONTRAST:  Isovue 370, 80 mL. COMPARISON:  MRI cervical spine 10/25/2016. Intraoperative radiograph 01/09/2017. FINDINGS: Pharynx and larynx: There is a retropharyngeal fluid collection, relatively hypoattenuating, attenuation 30-40 HU, epicenter at the surgical site. Approximate cross-sectional measurements are 3 x 1.5 x 5 cm. Considerations include seroma, hematoma, or CSF leak. The hypopharynx,  larynx, trachea, and esophagus are displaced anteriorly. Correlate clinically for stridor or wheezing. Salivary glands: No inflammation, mass, or stone. Thyroid: Normal. Lymph nodes: None enlarged or abnormal density. Vascular: Negative. Limited intracranial: Negative. Visualized orbits: Negative. Mastoids and visualized paranasal sinuses: Clear. Skeleton: Unremarkable appearing C4-5 and C5-6 ACDF. Previous C3-4 ACDF. Upper chest: Reported separately. Other: Mild fluid and air in the RIGHT neck along the surgical exploration site. This appearance is non worrisome. IMPRESSION: Status post C4-C6 ACDF 2 days ago. Hypoattenuating retropharyngeal fluid collection at the surgical site, approximately 3 x 1.5 x 5 cm. Considerations include seroma, hematoma, or contained CSF leak. Displacement of the hypopharynx, airway, and esophagus anteriorly. Given that the patient has low oxygen saturations intermittently, careful observation is recommended to the possibility that the patient may have an expanding fluid collection, which could result in airway obstruction. These results were called by telephone at the time of interpretation on 01/11/2017 at 8:57 am  to Dr. Frederick Peers , who verbally acknowledged these results. Electronically Signed   By: Elsie Stain M.D.   On: 01/11/2017 08:59   Ct Angio Chest Pe W/cm &/or Wo Cm  Result Date: 01/11/2017 CLINICAL DATA:  Shortness of breath since yesterday. Neck surgery yesterday. Low O2 saturation. EXAM: CT ANGIOGRAPHY CHEST WITH CONTRAST TECHNIQUE: Multidetector CT imaging of the chest was performed using the standard protocol during bolus administration of intravenous contrast. Multiplanar CT image reconstructions and MIPs were obtained to evaluate the vascular anatomy. CONTRAST:  80 mL Isovue 370 COMPARISON:  01/15/2007 FINDINGS: Cardiovascular: Satisfactory opacification of the pulmonary arteries to the segmental level. No evidence of pulmonary embolism. Mild cardiomegaly. No pericardial effusion. Normal caliber thoracic aorta. No thoracic aortic dissection. Mediastinum/Nodes: No enlarged mediastinal, hilar, or axillary lymph nodes. Thyroid gland, trachea, and esophagus demonstrate no significant findings. Lungs/Pleura: No focal consolidation, pleural effusion or pneumothorax. Right basilar bandlike area of airspace disease most consistent with atelectasis. Upper Abdomen: No acute upper abdominal abnormality. Musculoskeletal: No acute osseous abnormality. No lytic or sclerotic osseous lesion. Soft tissue: Postsurgical changes in the right lower lobe neck with soft tissue emphysema better delineated on CT of the neck performed same day. Review of the MIP images confirms the above findings. IMPRESSION: 1. No evidence of pulmonary embolus. 2. Postsurgical changes in the right lower lobe neck with soft tissue emphysema better delineated on CT of the neck performed same day. Electronically Signed   By: Elige Ko   On: 01/11/2017 08:39   Dg C-arm 1-60 Min  Result Date: 01/09/2017 CLINICAL DATA:  ACDF C4-5 and C5-6 EXAM: CERVICAL SPINE 1 VIEW COMPARISON:  Cervical spine MRI 10/25/2016 FINDINGS: Single lateral view  of the operating of the cervical spine. Pre-existing fusion C3-4 unchanged Interval ACDF with anterior plate and interbody spacers at C4-5 and C5-6 in good position. IMPRESSION: Satisfactory ACDF C4-5 and C5-6. Electronically Signed   By: Marlan Palau M.D.   On: 01/09/2017 10:54    Disposition: 01-Home or Self Care  Discharge Instructions    Call MD / Call 911    Complete by:  As directed    If you experience chest pain or shortness of breath, CALL 911 and be transported to the hospital emergency room.  If you develope a fever above 101 F, pus (white drainage) or increased drainage or redness at the wound, or calf pain, call your surgeon's office.   Diet - low sodium heart healthy    Complete by:  As directed    Increase activity slowly  as tolerated    Complete by:  As directed       Follow-up Information    Estill Bambergumonski, Mark, MD. Schedule an appointment as soon as possible for a visit in 10 day(s).   Specialty:  Orthopedic Surgery Why:  Or keep your current appointment if you already have one. Contact information: [address] [place] [id number] [phone]            Signed: Matthew FolksBETHUNE,Maddilynn Esperanza G 01/12/2017, 9:24 AM

## 2017-01-12 NOTE — Progress Notes (Signed)
Patients BP elevated contacted [facility] updated on patients condition received an order for Hydralazine.  Will continue to monitor.

## 2017-01-12 NOTE — Progress Notes (Signed)
Subjective:    Patient reports pain as mild.  He reports that he has no shortness of breath or difficulty swallowing. He reports that he feels better than he did yesterday upon admission. He denies pain or numbness and tingling radiating into either arm. He did have some difficulty using his CPAP last night in conjunction with his cervical collar.  Objective: Vital signs in last 24 hours: Temp:  [97.5 F (36.4 C)-98 F (36.7 C)] 97.9 F (36.6 C) (10/21 0821) Pulse Rate:  [74-91] 91 (10/21 0410) Resp:  [11-20] 14 (10/21 0821) BP: (92-163)/(83-112) 163/105 (10/21 0821) SpO2:  [93 %-99 %] 94 % (10/20 2221)  Intake/Output from previous day: 10/20 0701 - 10/21 0700 In: 240 [P.O.:240] Out: -  Intake/Output this shift: No intake/output data recorded.   Recent Labs  01/11/17 0208  HGB 13.9    Recent Labs  01/11/17 0208  WBC 7.3  RBC 6.46*  HCT 42.8  PLT 164    Recent Labs  01/11/17 0208  NA 137  K 3.8  CL 102  CO2 26  BUN 13  CREATININE 1.17  GLUCOSE 112*  CALCIUM 8.9   No results for input(s): LABPT, INR in the last 72 hours.  Cervical spine exam: His neck is supple. No evidence of a palpable hematoma anteriorly. His dressing is clean and dry. The patient is breathing and swallowing without difficulty. His upper extremities showed no motor weakness or sensory deficits.  Assessment/Plan:    3 days status post anterior cervical fusion C4-C6 with hematoma. Symptoms much improved after IV steroids. Plan: Discharge home. He is instructed to wear his collar at all times. He may loosen it at night while using his sleep apnea machine. He has an Rx for Percocet and Valium to use as needed. He will see Dr. Yevette Edwardsumonski in the office for follow-up in approximately 10-14 days.   Kaziyah Parkison G 01/12/2017, 9:16 AM

## 2017-01-12 NOTE — Progress Notes (Signed)
Discharge instructions given to patient, all questions answered at this time.  Pt. VSS with no s/s of distress noted patient stable at discharge.

## 2017-01-13 ENCOUNTER — Encounter (HOSPITAL_COMMUNITY): Payer: Self-pay | Admitting: Orthopedic Surgery

## 2017-01-16 NOTE — Discharge Summary (Signed)
Patient ID: Andrew Fisher MRN: [id number] DOB/AGE: 62-Jan-1957 62 y.o.  Admit date: 01/09/2017 Discharge date: 01/10/2017  Admission Diagnoses:  Active Problems:   Radiculopathy   Discharge Diagnoses:  Same  Past Medical History:  Diagnosis Date  . Arthritis   . Asthma   . GERD (gastroesophageal reflux disease)   . Hearing loss   . Hemorrhoids   . Hyperlipidemia   . Low testosterone in male   . Peripheral neuropathy   . Sleep apnea    wears cpap  . Sore throat   . Wears glasses   . Wears hearing aid in both ears     Surgeries: Procedure(s): ANTERIOR CERVICAL DECOMPRESSION FUSION, CERVICAL FOUR-FIVE, CERVICAL FIVE-SIX INSTRUMENTATION AND ALLOGRAFT on 01/09/2017   Consultants: None  Discharged Condition: Improved  Hospital Course: Andrew Fisher is an 61 y.o. male who was admitted 01/09/2017 for operative treatment of radiculopathy. Patient has severe unremitting pain that affects sleep, daily activities, and work/hobbies. After pre-op clearance the patient was taken to the operating room on 01/09/2017 and underwent  Procedure(s): ANTERIOR CERVICAL DECOMPRESSION FUSION, CERVICAL FOUR-FIVE, CERVICAL FIVE-SIX INSTRUMENTATION AND ALLOGRAFT.    Patient was given perioperative antibiotics:  Anti-infectives    Start     Dose/Rate Route Frequency Ordered Stop   01/09/17 1600  ceFAZolin (ANCEF) IVPB 2g/100 mL premix     2 g 200 mL/hr over 30 Minutes Intravenous Every 8 hours 01/09/17 1211 01/09/17 2336   01/09/17 0549  ceFAZolin (ANCEF) 2-4 GM/100ML-% IVPB    Comments:  Rogelia MireMichael, Cynthia   : cabinet override      01/09/17 0549 01/09/17 0805   01/09/17 0543  ceFAZolin (ANCEF) IVPB 2g/100 mL premix     2 g 200 mL/hr over 30 Minutes Intravenous On call to O.R. 01/09/17 0543 01/09/17 0805       Patient was given sequential compression devices, early ambulation to prevent DVT.  Patient benefited maximally from hospital stay and there were no complications.     Recent vital signs: BP (!) 156/79 (BP Location: Left Arm)   Pulse 84   Temp 98.4 F (36.9 C) (Oral)   Resp 20   Ht 6' (1.829 m)   Wt 95.7 kg (211 lb)   SpO2 98%   BMI 28.62 kg/m    Discharge Medications:   Allergies as of 01/10/2017   No Known Allergies     Medication List    TAKE these medications   atorvastatin 10 MG tablet Commonly known as:  LIPITOR Take 10 mg by mouth daily.   AXIRON 30 MG/ACT Soln Generic drug:  Testosterone Place 1 application onto the skin daily. *Underarm   ergocalciferol 50000 units capsule Commonly known as:  VITAMIN D2 Take 50,000 Units by mouth See admin instructions. Take [id number] units by mouth Tuesday and Friday.   gabapentin 300 MG capsule Commonly known as:  NEURONTIN Take 300 mg by mouth 4 (four) times daily.   lansoprazole 30 MG capsule Commonly known as:  PREVACID Take 30 mg by mouth daily at 12 noon.   loratadine 10 MG tablet Commonly known as:  CLARITIN Take 10 mg by mouth daily.   montelukast 10 MG tablet Commonly known as:  SINGULAIR Take 10 mg by mouth at bedtime.   MULTIVITAMIN PO Take 1 tablet by mouth daily.   PROAIR HFA 108 (90 Base) MCG/ACT inhaler Generic drug:  albuterol Inhale 1-2 puffs into the lungs every 6 (six) hours as needed for wheezing or shortness of breath.  tamsulosin 0.4 MG Caps capsule Commonly known as:  FLOMAX Take 0.4 mg by mouth daily.       Diagnostic Studies: Dg Chest 2 View  Result Date: 01/11/2017 CLINICAL DATA:  Shortness of breath tonight. Discharged yesterday after a cervical fusion. EXAM: CHEST  2 VIEW COMPARISON:  01/06/2017 FINDINGS: Shallow inspiration. Linear opacities in the lung bases, greater on the right, and new since previous study. This could be due to atelectasis or pneumonia. No blunting of costophrenic angles. No pneumothorax. Heart size and pulmonary vascularity are normal. Thoracic scoliosis convex towards the right. Surgical changes in the cervical spine.  IMPRESSION: Interval development of linear opacities in the lung bases suggesting either atelectasis or pneumonia. Electronically Signed   By: Burman Nieves M.D.   On: 01/11/2017 02:59   Dg Chest 2 View  Result Date: 01/06/2017 CLINICAL DATA:  Preoperative evaluation for cervical spine surgery, history GERD, former smoker EXAM: CHEST  2 VIEW COMPARISON:  11/29/2015 FINDINGS: Normal heart size, mediastinal contours, and pulmonary vascularity. Minimal scarring RIGHT base. Lungs otherwise clear. No acute infiltrate, pleural effusion or pneumothorax. Bones unremarkable. IMPRESSION: Minimal RIGHT basilar scarring. No acute abnormalities. Electronically Signed   By: Ulyses Southward M.D.   On: 01/06/2017 15:44   Dg Cervical Spine 1 View  Result Date: 01/09/2017 CLINICAL DATA:  ACDF C4-5 and C5-6 EXAM: CERVICAL SPINE 1 VIEW COMPARISON:  Cervical spine MRI 10/25/2016 FINDINGS: Single lateral view of the operating of the cervical spine. Pre-existing fusion C3-4 unchanged Interval ACDF with anterior plate and interbody spacers at C4-5 and C5-6 in good position. IMPRESSION: Satisfactory ACDF C4-5 and C5-6. Electronically Signed   By: Marlan Palau M.D.   On: 01/09/2017 10:54   Ct Soft Tissue Neck W Contrast  Result Date: 01/11/2017 CLINICAL DATA:  Shortness of breath since 01/10/2017. C4-5 and C5-6 ACDF performed 01/09/2017. EXAM: CT NECK WITH CONTRAST TECHNIQUE: Multidetector CT imaging of the neck was performed using the standard protocol following the bolus administration of intravenous contrast. CONTRAST:  Isovue 370, 80 mL. COMPARISON:  MRI cervical spine 10/25/2016. Intraoperative radiograph 01/09/2017. FINDINGS: Pharynx and larynx: There is a retropharyngeal fluid collection, relatively hypoattenuating, attenuation 30-40 HU, epicenter at the surgical site. Approximate cross-sectional measurements are 3 x 1.5 x 5 cm. Considerations include seroma, hematoma, or CSF leak. The hypopharynx, larynx, trachea, and  esophagus are displaced anteriorly. Correlate clinically for stridor or wheezing. Salivary glands: No inflammation, mass, or stone. Thyroid: Normal. Lymph nodes: None enlarged or abnormal density. Vascular: Negative. Limited intracranial: Negative. Visualized orbits: Negative. Mastoids and visualized paranasal sinuses: Clear. Skeleton: Unremarkable appearing C4-5 and C5-6 ACDF. Previous C3-4 ACDF. Upper chest: Reported separately. Other: Mild fluid and air in the RIGHT neck along the surgical exploration site. This appearance is non worrisome. IMPRESSION: Status post C4-C6 ACDF 2 days ago. Hypoattenuating retropharyngeal fluid collection at the surgical site, approximately 3 x 1.5 x 5 cm. Considerations include seroma, hematoma, or contained CSF leak. Displacement of the hypopharynx, airway, and esophagus anteriorly. Given that the patient has low oxygen saturations intermittently, careful observation is recommended to the possibility that the patient may have an expanding fluid collection, which could result in airway obstruction. These results were called by telephone at the time of interpretation on 01/11/2017 at 8:57 am to Dr. Frederick Peers , who verbally acknowledged these results. Electronically Signed   By: Elsie Stain M.D.   On: 01/11/2017 08:59   Ct Angio Chest Pe W/cm &/or Wo Cm  Result Date: 01/11/2017 CLINICAL DATA:  Shortness of breath since yesterday. Neck surgery yesterday. Low O2 saturation. EXAM: CT ANGIOGRAPHY CHEST WITH CONTRAST TECHNIQUE: Multidetector CT imaging of the chest was performed using the standard protocol during bolus administration of intravenous contrast. Multiplanar CT image reconstructions and MIPs were obtained to evaluate the vascular anatomy. CONTRAST:  80 mL Isovue 370 COMPARISON:  01/15/2007 FINDINGS: Cardiovascular: Satisfactory opacification of the pulmonary arteries to the segmental level. No evidence of pulmonary embolism. Mild cardiomegaly. No pericardial  effusion. Normal caliber thoracic aorta. No thoracic aortic dissection. Mediastinum/Nodes: No enlarged mediastinal, hilar, or axillary lymph nodes. Thyroid gland, trachea, and esophagus demonstrate no significant findings. Lungs/Pleura: No focal consolidation, pleural effusion or pneumothorax. Right basilar bandlike area of airspace disease most consistent with atelectasis. Upper Abdomen: No acute upper abdominal abnormality. Musculoskeletal: No acute osseous abnormality. No lytic or sclerotic osseous lesion. Soft tissue: Postsurgical changes in the right lower lobe neck with soft tissue emphysema better delineated on CT of the neck performed same day. Review of the MIP images confirms the above findings. IMPRESSION: 1. No evidence of pulmonary embolus. 2. Postsurgical changes in the right lower lobe neck with soft tissue emphysema better delineated on CT of the neck performed same day. Electronically Signed   By: Elige Ko   On: 01/11/2017 08:39   Dg C-arm 1-60 Min  Result Date: 01/09/2017 CLINICAL DATA:  ACDF C4-5 and C5-6 EXAM: CERVICAL SPINE 1 VIEW COMPARISON:  Cervical spine MRI 10/25/2016 FINDINGS: Single lateral view of the operating of the cervical spine. Pre-existing fusion C3-4 unchanged Interval ACDF with anterior plate and interbody spacers at C4-5 and C5-6 in good position. IMPRESSION: Satisfactory ACDF C4-5 and C5-6. Electronically Signed   By: Marlan Palau M.D.   On: 01/09/2017 10:54    Disposition: 01-Home or Self Care   POD #1 s/p C4-6 ACDF, doing well  - encourage ambulation - Percocet for pain, Valium for muscle spasms -Written scripts for pain signed and in chart -D/C instructions sheet printed and in chart -D/C today  -F/U in office 2 weeks   Signed: Georga Bora 01/16/2017, 1:31 PM

## 2018-01-26 ENCOUNTER — Other Ambulatory Visit: Payer: Self-pay | Admitting: Internal Medicine

## 2018-01-30 ENCOUNTER — Telehealth: Payer: Self-pay | Admitting: Nurse Practitioner

## 2018-01-30 ENCOUNTER — Ambulatory Visit (INDEPENDENT_AMBULATORY_CARE_PROVIDER_SITE_OTHER): Payer: POS | Admitting: Nurse Practitioner

## 2018-01-30 ENCOUNTER — Encounter: Payer: Self-pay | Admitting: Nurse Practitioner

## 2018-01-30 ENCOUNTER — Ambulatory Visit
Admission: RE | Admit: 2018-01-30 | Discharge: 2018-01-30 | Disposition: A | Discharge status: Home / Self Care | Payer: 59 | Source: Ambulatory Visit | Attending: Nurse Practitioner | Admitting: Nurse Practitioner

## 2018-01-30 VITALS — BP 130/80 | HR 67 | Temp 98.0°F | Ht 68.0 in | Wt 210.0 lb

## 2018-01-30 DIAGNOSIS — M79675 Pain in left toe(s): Secondary | ICD-10-CM

## 2018-01-30 DIAGNOSIS — W2209XA Striking against other stationary object, initial encounter: Secondary | ICD-10-CM

## 2018-01-30 DIAGNOSIS — S92525A Nondisplaced fracture of medial phalanx of left lesser toe(s), initial encounter for closed fracture: Secondary | ICD-10-CM

## 2018-01-30 MED ORDER — KETOROLAC TROMETHAMINE 60 MG/2ML IM SOLN
60.0000 mg | Freq: Once | INTRAMUSCULAR | Status: AC
Start: 2018-01-30 — End: 2018-01-30
  Administered 2018-01-30: 60 mg via INTRAMUSCULAR

## 2018-01-30 NOTE — Addendum Note (Signed)
Addended by: Arnette Felts F on: 01/30/2018 05:58 PM   Modules accepted: Orders

## 2018-01-30 NOTE — Progress Notes (Signed)
Subjective:     Patient ID: Andrew Fisher , male    DOB: 1955/12/03 , 62 y.o.   MRN: [id number]   Chief Complaint  Patient presents with  . Toe Pain    patient states he hit his toe on something last night     HPI  Toe Pain   The incident occurred 12 to 24 hours ago. Injury mechanism: hit left foot into door  The pain is present in the left toes. The quality of the pain is described as aching. The pain is at a severity of 7/10. Pertinent negatives include no loss of sensation, numbness or tingling. He reports no foreign bodies present. The symptoms are aggravated by movement. He has tried nothing for the symptoms.     Past Medical History:  Diagnosis Date  . Arthritis   . Asthma   . GERD (gastroesophageal reflux disease)   . Hearing loss   . Hemorrhoids   . Hyperlipidemia   . Low testosterone in male   . Peripheral neuropathy   . Sleep apnea    wears cpap  . Sore throat   . Wears glasses   . Wears hearing aid in both ears      No family history on file.   Current Outpatient Medications:  .  albuterol (PROAIR HFA) 108 (90 Base) MCG/ACT inhaler, Inhale 1-2 puffs into the lungs every 6 (six) hours as needed for wheezing or shortness of breath., Disp: , Rfl:  .  atorvastatin (LIPITOR) 10 MG tablet, Take 10 mg by mouth daily., Disp: , Rfl:  .  ergocalciferol (VITAMIN D2) 50000 units capsule, Take 50,000 Units by mouth See admin instructions. Take 78295 units by mouth Tuesday and Friday., Disp: , Rfl:  .  gabapentin (NEURONTIN) 300 MG capsule, Take 300 mg by mouth 4 (four) times daily., Disp: , Rfl:  .  lansoprazole (PREVACID) 30 MG capsule, Take 30 mg by mouth daily at 12 noon., Disp: , Rfl:  .  loratadine (CLARITIN) 10 MG tablet, Take 10 mg by mouth daily., Disp: , Rfl:  .  montelukast (SINGULAIR) 10 MG tablet, TAKE ONE (1) TABLET BY MOUTH EVERY DAY, Disp: 90 tablet, Rfl: 1 .  Multiple Vitamins-Minerals (MULTIVITAMIN PO), Take 1 tablet by mouth daily., Disp: , Rfl:  .   tamsulosin (FLOMAX) 0.4 MG CAPS capsule, Take 0.4 mg by mouth daily. , Disp: , Rfl:  .  Testosterone (AXIRON) 30 MG/ACT SOLN, Place 1 application onto the skin daily. *Underarm, Disp: , Rfl:    No Known Allergies   Review of Systems  Respiratory: Negative.   Cardiovascular: Negative.   Musculoskeletal: Positive for arthralgias.  Skin: Negative.   Neurological: Negative.  Negative for tingling and numbness.     Today's Vitals   01/30/18 1019  BP: 130/80  Pulse: 67  Temp: 98 F (36.7 C)  TempSrc: Oral  SpO2: 96%  Weight: 210 lb (95.3 kg)  Height: 5\' 8"  (1.727 m)  PainSc: 2   PainLoc: Toe   Body mass index is 31.93 kg/m.   Objective:  Physical Exam  Constitutional: He appears well-developed and well-nourished.  Cardiovascular: Normal rate and regular rhythm.  Pulmonary/Chest: Effort normal and breath sounds normal. No respiratory distress. He exhibits no tenderness.  Musculoskeletal: He exhibits tenderness. He exhibits no deformity.       Right foot: There is no deformity.       Left foot: There is no deformity.       Feet:  Feet:  Right Foot:  Skin Integrity: Negative for erythema.  Left Foot:  Skin Integrity: Positive for erythema.  Skin: Skin is warm and dry. Capillary refill takes less than 2 seconds. There is erythema.        Assessment And Plan:     1. Pain in left toe(s)  Bruise vs fracture  Will treat with Toradol 60 mg to help with his pain.    Will send for xray to evaluate for fracture.   Buddy tape done to 4th and 5th toe - DG Foot Complete Left; Future - ketorolac (TORADOL) injection 60 mg    Arnette Felts, FNP

## 2018-01-30 NOTE — Patient Instructions (Signed)
Foot Pain Many things can cause foot pain. Some common causes are:  An injury.  A sprain.  Arthritis.  Blisters.  Bunions.  Follow these instructions at home: Pay attention to any changes in your symptoms. Take these actions to help with your discomfort:  If directed, put ice on the affected area: ? Put ice in a plastic bag. ? Place a towel between your skin and the bag. ? Leave the ice on for 15-20 minutes, 3?4 times a day for 2 days.  Take over-the-counter and prescription medicines only as told by your health care provider.  Wear comfortable, supportive shoes that fit you well. Do not wear high heels.  Do not stand or walk for long periods of time.  Do not lift a lot of weight. This can put added pressure on your feet.  Do stretches to relieve foot pain and stiffness as told by your health care provider.  Rub your foot gently.  Keep your feet clean and dry.  Contact a health care provider if:  Your pain does not get better after a few days of self-care.  Your pain gets worse.  You cannot stand on your foot. Get help right away if:  Your foot is numb or tingling.  Your foot or toes are swollen.  Your foot or toes turn white or blue.  You have warmth and redness along your foot. This information is not intended to replace advice given to you by your health care provider. Make sure you discuss any questions you have with your health care provider. Document Released: 04/07/2015 Document Revised: 08/17/2015 Document Reviewed: 04/06/2014 Elsevier Interactive Patient Education  2018 [employer]  

## 2018-01-30 NOTE — Telephone Encounter (Signed)
Called patient to inform he has a fracture to his left 5th toe, will refer to orthopedics for further evaluation.  Advised to limit activity this weekend and to buddy tape. May take aleve/ibuprofen as needed

## 2018-02-16 ENCOUNTER — Ambulatory Visit: Payer: 59 | Admitting: Internal Medicine

## 2018-02-20 ENCOUNTER — Other Ambulatory Visit: Payer: Self-pay | Admitting: Internal Medicine

## 2018-03-24 ENCOUNTER — Other Ambulatory Visit: Payer: Self-pay | Admitting: Internal Medicine

## 2018-03-24 MED ORDER — OSELTAMIVIR PHOSPHATE 75 MG PO CAPS: 75.0000 mg | ORAL_CAPSULE | Freq: Every day | ORAL | 0 refills | Status: AC

## 2018-03-24 NOTE — Progress Notes (Signed)
Pt called saying that his wife has tested positive for influenza and would like preventive medication. I sent Tamiflu.

## 2018-03-26 ENCOUNTER — Ambulatory Visit (INDEPENDENT_AMBULATORY_CARE_PROVIDER_SITE_OTHER): Payer: POS | Admitting: Internal Medicine

## 2018-03-26 ENCOUNTER — Other Ambulatory Visit: Payer: Self-pay

## 2018-03-26 ENCOUNTER — Encounter: Payer: Self-pay | Admitting: Internal Medicine

## 2018-03-26 VITALS — BP 122/78 | HR 74 | Temp 98.9°F | Ht 68.0 in | Wt 207.2 lb

## 2018-03-26 DIAGNOSIS — J3089 Other allergic rhinitis: Secondary | ICD-10-CM | POA: Diagnosis not present

## 2018-03-26 DIAGNOSIS — E7800 Pure hypercholesterolemia, unspecified: Secondary | ICD-10-CM | POA: Insufficient documentation

## 2018-03-26 DIAGNOSIS — R05 Cough: Secondary | ICD-10-CM | POA: Insufficient documentation

## 2018-03-26 DIAGNOSIS — R059 Cough, unspecified: Secondary | ICD-10-CM

## 2018-03-26 DIAGNOSIS — Z800 Family history of malignant neoplasm of digestive organs: Secondary | ICD-10-CM

## 2018-03-26 DIAGNOSIS — Z20828 Contact with and (suspected) exposure to other viral communicable diseases: Secondary | ICD-10-CM | POA: Diagnosis not present

## 2018-03-26 DIAGNOSIS — Z79899 Other long term (current) drug therapy: Secondary | ICD-10-CM

## 2018-03-26 DIAGNOSIS — J309 Allergic rhinitis, unspecified: Secondary | ICD-10-CM | POA: Insufficient documentation

## 2018-03-26 LAB — POC INFLUENZA A&B (BINAX/QUICKVUE)
Influenza A, POC: NEGATIVE
Influenza B, POC: NEGATIVE

## 2018-03-26 NOTE — Patient Instructions (Signed)

## 2018-03-27 LAB — CMP14+EGFR
ALT: 18 IU/L (ref 0–44)
AST: 18 IU/L (ref 0–40)
Albumin/Globulin Ratio: 1.9 (ref 1.2–2.2)
Albumin: 4.4 g/dL (ref 3.6–4.8)
Alkaline Phosphatase: 89 IU/L (ref 39–117)
BUN/Creatinine Ratio: 11 (ref 10–24)
BUN: 13 mg/dL (ref 8–27)
Bilirubin Total: 0.4 mg/dL (ref 0.0–1.2)
CO2: 23 mmol/L (ref 20–29)
CREATININE: 1.19 mg/dL (ref 0.76–1.27)
Calcium: 9.2 mg/dL (ref 8.6–10.2)
Chloride: 102 mmol/L (ref 96–106)
GFR calc Af Amer: 75 mL/min/{1.73_m2} (ref >59)
GFR calc non Af Amer: 65 mL/min/{1.73_m2} (ref >59)
Globulin, Total: 2.3 g/dL (ref 1.5–4.5)
Glucose: 102 mg/dL — ABNORMAL HIGH (ref 65–99)
Potassium: 4.7 mmol/L (ref 3.5–5.2)
Sodium: 138 mmol/L (ref 134–144)
Total Protein: 6.7 g/dL (ref 6.0–8.5)

## 2018-03-27 LAB — LIPID PANEL
CHOL/HDL RATIO: 4 ratio (ref 0.0–5.0)
Cholesterol, Total: 152 mg/dL (ref 100–199)
HDL: 38 mg/dL — ABNORMAL LOW (ref >39)
LDL Calculated: 102 mg/dL — ABNORMAL HIGH (ref 0–99)
Triglycerides: 60 mg/dL (ref 0–149)
VLDL Cholesterol Cal: 12 mg/dL (ref 5–40)

## 2018-03-28 NOTE — Progress Notes (Signed)
Your chol is pretty good. Your hdl is low, important to exercise five days weekly for at least 30 minutes. Your liver and kidney function are stable.

## 2018-03-29 ENCOUNTER — Encounter: Payer: Self-pay | Admitting: Internal Medicine

## 2018-03-29 NOTE — Progress Notes (Signed)
Subjective:     Patient ID: Andrew Fisher , male    DOB: October 30, 1955 , 63 y.o.   MRN: [id number]   Chief Complaint  Patient presents with  . Hyperlipidemia  . check for flu    HPI  Hyperlipidemia  This is a chronic problem. The current episode started more than 1 year ago. The problem is controlled. Recent lipid tests were reviewed and are normal. Exacerbating diseases include obesity. Current antihyperlipidemic treatment includes statins. Risk factors for coronary artery disease include male sex, obesity and dyslipidemia.     Check for flu  He reports his wife has the flu. He has a scratchy throat. He wants to be checked for the flu. He denies fever/chills.   Past Medical History:  Diagnosis Date  . Arthritis   . Asthma   . GERD (gastroesophageal reflux disease)   . Hearing loss   . Hemorrhoids   . Hyperlipidemia   . Low testosterone in male   . Peripheral neuropathy   . Sleep apnea    wears cpap  . Sore throat   . Wears glasses   . Wears hearing aid in both ears      Family History  Problem Relation Age of Onset  . Diabetes Mother   . Hypertension Mother   . Healthy Father   . Cancer Other      Current Outpatient Medications:  .  albuterol (PROAIR HFA) 108 (90 Base) MCG/ACT inhaler, Inhale 1-2 puffs into the lungs every 6 (six) hours as needed for wheezing or shortness of breath., Disp: , Rfl:  .  atorvastatin (LIPITOR) 10 MG tablet, TAKE 1 TABLET DAILY, Disp: 90 tablet, Rfl: 1 .  Cholecalciferol (VITAMIN D3 PO), Take by mouth., Disp: , Rfl:  .  gabapentin (NEURONTIN) 300 MG capsule, Take 300 mg by mouth 4 (four) times daily., Disp: , Rfl:  .  lansoprazole (PREVACID) 30 MG capsule, Take 30 mg by mouth daily at 12 noon., Disp: , Rfl:  .  loratadine (CLARITIN) 10 MG tablet, Take 10 mg by mouth daily., Disp: , Rfl:  .  montelukast (SINGULAIR) 10 MG tablet, TAKE ONE (1) TABLET BY MOUTH EVERY DAY, Disp: 90 tablet, Rfl: 1 .  Multiple Vitamins-Minerals  (MULTIVITAMIN PO), Take 1 tablet by mouth daily., Disp: , Rfl:  .  oseltamivir (TAMIFLU) 75 MG capsule, Take 1 capsule (75 mg total) by mouth daily for 10 days., Disp: 10 capsule, Rfl: 0 .  tamsulosin (FLOMAX) 0.4 MG CAPS capsule, Take 0.4 mg by mouth daily. , Disp: , Rfl:  .  Testosterone (AXIRON) 30 MG/ACT SOLN, Place 1 application onto the skin daily. *Underarm, Disp: , Rfl:    No Known Allergies   Review of Systems  Constitutional: Negative.   HENT: Positive for sore throat.   Respiratory: Positive for cough.   Cardiovascular: Negative.   Gastrointestinal: Negative.   Neurological: Negative.   Psychiatric/Behavioral: Negative.      Today's Vitals   03/26/18 1134  BP: 122/78  Pulse: 74  Temp: 98.9 F (37.2 C)  TempSrc: Oral  SpO2: 96%  Weight: 207 lb 3.2 oz (94 kg)  Height: _0  (1.727 m)  PainSc: 0-No pain   Body mass index is 31.5 kg/m.   Objective:  Physical Exam Vitals signs and nursing note reviewed.  Constitutional:      Appearance: Normal appearance. He is obese.  HENT:     Head: Normocephalic and atraumatic.     Right Ear: Tympanic membrane, ear canal  and external ear normal.     Left Ear: Tympanic membrane, ear canal and external ear normal.     Mouth/Throat:     Mouth: Mucous membranes are moist.     Pharynx: Posterior oropharyngeal erythema present.  Cardiovascular:     Rate and Rhythm: Normal rate and regular rhythm.     Heart sounds: Normal heart sounds.  Pulmonary:     Effort: Pulmonary effort is normal.     Breath sounds: Normal breath sounds.  Skin:    General: Skin is warm.  Neurological:     General: No focal deficit present.     Mental Status: He is alert.         Assessment And Plan:     1. Pure hypercholesterolemia  I will check labs as listed below. He is encouraged to avoid fried foods, increase exercise and to increase his fish intake.   - Lipid Profile - CMP14+EGFR  2. Cough  Flu test is negative. He is encouraged to  use Delsym OTC cough syrup as needed.   - POC Influenza A&B(BINAX/QUICKVUE)  3. Non-seasonal allergic rhinitis due to other allergic trigger  He will resume loratadine daily. He will let me know if his sx persist.   4. Exposure to the flu  He was given rx tamiflu 61m daily x 10 days.   5. Family history of colon cancer  I will refer him to GI for CRC screening. He reports his mother recently died from colon cancer.   6. Drug therapy   RMaximino Greenland MD

## 2018-04-07 ENCOUNTER — Other Ambulatory Visit: Payer: Self-pay | Admitting: Internal Medicine

## 2018-05-04 ENCOUNTER — Ambulatory Visit (INDEPENDENT_AMBULATORY_CARE_PROVIDER_SITE_OTHER): Payer: POS | Admitting: Nurse Practitioner

## 2018-05-04 ENCOUNTER — Encounter: Payer: Self-pay | Admitting: Nurse Practitioner

## 2018-05-04 VITALS — BP 122/82 | HR 62 | Temp 97.9°F | Wt 209.0 lb

## 2018-05-04 DIAGNOSIS — Z111 Encounter for screening for respiratory tuberculosis: Secondary | ICD-10-CM | POA: Insufficient documentation

## 2018-05-04 NOTE — Patient Instructions (Signed)
Tuberculin Skin Test  Why am I having this test?  The tuberculin skin test is used to check whether a person has been exposed to the bacteria that causes tuberculosis (TB) (Mycobacterium tuberculosis). Tuberculosis is a bacterial infection that usually affects the lungs but can affect other parts of the body. You may have a tuberculin skin test if:   You have possible symptoms of TB, such as:  ? Coughing up blood, mucus from the lungs (sputum), or both.  ? A cough that lasts three weeks or longer.  ? Chest pain, or pain while breathing or coughing.  ? Unexplained weight loss.  ? Fatigue and weakness.  ? Fever, sweating, and chills.  ? Loss of appetite.   You are at high risk for getting TB. You may be at high risk if you:  ? Inject illegal drugs or share needles.  ? Have HIV or other diseases that affect the disease-fighting (immune) system.  ? Work in a health care facility.  ? Live in a high-risk community, such as a homeless shelter, nursing home, or correctional facility.  ? Have had contact with someone who has TB.  ? Are from or have traveled to a country where TB is common.  If you are at high risk, you may need to have regular TB screenings. TB screening may be required when starting a new job, such as becoming a health care worker or a teacher. Colleges or universities may require TB screening for new students.  What is being tested?  This test checks for the presence of TB antibodies in the body. Antibodies are a type of cell that is part of the body's immune system. After you get an infection, your body makes antibodies that stay in your body after you recover and protect you from getting the same infection again.  Tell a health care provider about:   Any allergies you have.   All medicines you are taking, including vitamins, herbs, eye drops, creams, and over-the-counter medicines.   Any blood disorders you have.   Any surgeries you have had.   Any medical conditions you have.   Whether you are  pregnant or may be pregnant.  What happens during the test?    Your health care provider will inject a solution called PPD (purified protein derivative) under the first layer of skin on your arm. This causes a small, blister-like bump to form over the area temporarily. PPD is made from the bacteria that causes TB. PPD causes your immune system to react, but it does not get you sick with TB.  You may feel mild stinging as this happens. Afterward, the area may itch or burn.  How are the results reported?  To get your test results, you will need to see your health care provider again within 2-3 days after you received the injection. It is important to follow your health care provider's instructions about when to be seen again. If you are not seen within 2-3 days, you may need to have the test repeated. At your follow-up visit, your health care provider will measure the area where the PPD was injected to see if the bump has gotten larger due to swelling. Your results will be reported as positive or negative:   If the bump has disappeared or is small, your test result is negative. Negative means that you do not have the antibodies.   If the bump is large, your test result is positive. Positive means that you have the   antibodies. Swelling is caused by the antibodies reacting with the PPD. The skin may also turn red around the bump.  A false-positive result can occur. A false positive is incorrect because it means that a condition is present when it is not.  A false-negative result can occur. A false negative is incorrect because it means that a condition is not present when it is. False negatives are rare and are more likely to occur in older people and in people who have weakened immune systems.  What do the results mean?  A negative result means that it is unlikely that you have TB or that you have been exposed to TB bacteria. This test may be repeated, or you may have a blood test to check for TB. This is because  your body may not react to the tuberculin skin test until several weeks after exposure to TB bacteria.  A positive result means that you have been exposed to TB, and you may need more tests to determine if you have:   Active TB, also called TB disease. This means that you have TB symptoms and your infection can spread to others (you are contagious).   Latent TB. This means that you do not have any symptoms of TB and you are not contagious. Latent TB can turn into active TB.  Talk with your health care provider about what your results mean.  Questions to ask your health care provider  Ask your health care provider, or the department that is doing the test:   When will my results be ready?   How will I get my results?   What are my treatment options?   What other tests do I need?   What are my next steps?  Summary   The tuberculin skin test is used to check whether a person has been exposed to the bacteria that causes tuberculosis (TB).   Your health care provider will inject a solution known as PPD (purified protein derivative) under the first layer of skin on your arm.   After 2-3 days, your health care provider will measure the area where the PPD was injected to see if the bump has gotten larger due to swelling.   Your results will be reported as positive or negative. A positive result means that you have been exposed to TB. A negative result means that it is unlikely that you have TB or that you have been exposed to TB bacteria.  This information is not intended to replace advice given to you by your health care provider. Make sure you discuss any questions you have with your health care provider.  Document Released: 12/19/2004 Document Revised: 11/20/2016 Document Reviewed: 11/20/2016  Elsevier Interactive Patient Education  2019 [employer]

## 2018-05-04 NOTE — Progress Notes (Signed)
Subjective:     Patient ID: Andrew Fisher , male    DOB: 06/02/1955 , 63 y.o.   MRN: [id number]   Chief Complaint  Patient presents with  . TB Skin    patient needs a TB skin test for work.    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 Denies hemoptysis, or fever of unknown origin or night sweats.      Past Medical History:  Diagnosis Date  . Arthritis   . Asthma   . GERD (gastroesophageal reflux disease)   . Hearing loss   . Hemorrhoids   . Hyperlipidemia   . Low testosterone in male   . Peripheral neuropathy   . Sleep apnea    wears cpap  . Sore throat   . Wears glasses   . Wears hearing aid in both ears      Family History  Problem Relation Age of Onset  . Diabetes Mother   . Hypertension Mother   . Healthy Father   . Cancer Other      Current Outpatient Medications:  .  albuterol (PROAIR HFA) 108 (90 Base) MCG/ACT inhaler, Inhale 1-2 puffs into the lungs every 6 (six) hours as needed for wheezing or shortness of breath., Disp: , Rfl:  .  atorvastatin (LIPITOR) 10 MG tablet, TAKE 1 TABLET DAILY, Disp: 90 tablet, Rfl: 1 .  Cholecalciferol (VITAMIN D3 PO), Take by mouth., Disp: , Rfl:  .  gabapentin (NEURONTIN) 300 MG capsule, Take 300 mg by mouth 4 (four) times daily., Disp: , Rfl:  .  lansoprazole (PREVACID) 30 MG capsule, Take 30 mg by mouth daily at 12 noon., Disp: , Rfl:  .  loratadine (CLARITIN) 10 MG tablet, Take 10 mg by mouth daily., Disp: , Rfl:  .  montelukast (SINGULAIR) 10 MG tablet, TAKE ONE (1) TABLET BY MOUTH EVERY DAY, Disp: 30 tablet, Rfl: 5 .  Multiple Vitamins-Minerals (MULTIVITAMIN PO), Take 1 tablet by mouth daily., Disp: , Rfl:  .  tamsulosin (FLOMAX) 0.4 MG CAPS capsule, Take 0.4 mg by mouth daily. , Disp: , Rfl:  .  Testosterone (AXIRON) 30 MG/ACT SOLN, Place 1 application onto the skin daily. *Underarm, Disp: , Rfl:    No Known Allergies   Review of Systems  Constitutional: Negative.   Respiratory: Negative.    Cardiovascular: Negative.  Negative for chest pain, palpitations and leg swelling.  Neurological: Negative for dizziness and headaches.     Today's Vitals   05/04/18 1506  BP: 122/82  Pulse: 62  Temp: 97.9 F (36.6 C)  TempSrc: Oral  SpO2: 96%  Weight: 209 lb (94.8 kg)  PainSc: 0-No pain   Body mass index is 31.78 kg/m.   Objective:  Physical Exam Vitals signs reviewed.  Constitutional:      Appearance: Normal appearance.  Cardiovascular:     Rate and Rhythm: Normal rate and regular rhythm.     Pulses: Normal pulses.     Heart sounds: Normal heart sounds. No murmur.  Pulmonary:     Effort: Pulmonary effort is normal. No respiratory distress.     Breath sounds: Normal breath sounds. No wheezing.  Neurological:     General: No focal deficit present.     Mental Status: He is alert.  Psychiatric:        Mood and Affect: Mood normal.         Assessment And Plan:     1. 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  2. Encounter for TB tine test   Arnette Felts, FNP

## 2018-05-06 ENCOUNTER — Encounter: Payer: Self-pay | Admitting: Nurse Practitioner

## 2018-05-06 DIAGNOSIS — Z111 Encounter for screening for respiratory tuberculosis: Secondary | ICD-10-CM | POA: Diagnosis not present

## 2018-05-06 LAB — TB SKIN TEST
Induration: 0 mm
TB Skin Test: NEGATIVE

## 2018-06-09 ENCOUNTER — Other Ambulatory Visit: Payer: Self-pay

## 2018-06-09 MED ORDER — ALBUTEROL SULFATE HFA 108 (90 BASE) MCG/ACT IN AERS: 1.0000 | INHALATION_SPRAY | Freq: Four times a day (QID) | RESPIRATORY_TRACT | 3 refills | Status: DC | PRN

## 2018-08-18 LAB — HM COLONOSCOPY

## 2018-08-19 ENCOUNTER — Other Ambulatory Visit: Payer: Self-pay | Admitting: Internal Medicine

## 2018-09-16 ENCOUNTER — Encounter: Payer: POS | Admitting: Internal Medicine

## 2018-10-07 ENCOUNTER — Encounter: Payer: Self-pay | Admitting: Internal Medicine

## 2018-10-28 ENCOUNTER — Encounter: Payer: POS | Admitting: Internal Medicine

## 2018-11-03 ENCOUNTER — Telehealth: Payer: Self-pay

## 2018-11-03 NOTE — Telephone Encounter (Signed)
The pt was told that [facility] called and said that the pt needed a long acting inhaler because he has been having the albuterol inhaler filled more frequently.  The pt said he would wait until his appt for his physical on the 19th of August.

## 2018-11-11 ENCOUNTER — Encounter: Payer: POS | Admitting: Nurse Practitioner

## 2018-11-16 ENCOUNTER — Other Ambulatory Visit: Payer: Self-pay

## 2018-11-16 ENCOUNTER — Ambulatory Visit (INDEPENDENT_AMBULATORY_CARE_PROVIDER_SITE_OTHER): Payer: POS | Admitting: Internal Medicine

## 2018-11-16 ENCOUNTER — Encounter: Payer: Self-pay | Admitting: Internal Medicine

## 2018-11-16 VITALS — BP 116/82 | HR 66 | Temp 98.0°F | Ht 68.8 in | Wt 214.2 lb

## 2018-11-16 DIAGNOSIS — E7800 Pure hypercholesterolemia, unspecified: Secondary | ICD-10-CM

## 2018-11-16 DIAGNOSIS — M79672 Pain in left foot: Secondary | ICD-10-CM

## 2018-11-16 DIAGNOSIS — R5383 Other fatigue: Secondary | ICD-10-CM

## 2018-11-16 DIAGNOSIS — Z23 Encounter for immunization: Secondary | ICD-10-CM

## 2018-11-16 DIAGNOSIS — J4520 Mild intermittent asthma, uncomplicated: Secondary | ICD-10-CM

## 2018-11-16 DIAGNOSIS — Z0000 Encounter for general adult medical examination without abnormal findings: Secondary | ICD-10-CM

## 2018-11-16 DIAGNOSIS — R7309 Other abnormal glucose: Secondary | ICD-10-CM | POA: Diagnosis not present

## 2018-11-16 DIAGNOSIS — Z139 Encounter for screening, unspecified: Secondary | ICD-10-CM

## 2018-11-16 DIAGNOSIS — Z1159 Encounter for screening for other viral diseases: Secondary | ICD-10-CM

## 2018-11-16 DIAGNOSIS — M25522 Pain in left elbow: Secondary | ICD-10-CM

## 2018-11-16 LAB — POCT URINALYSIS DIPSTICK
Bilirubin, UA: NEGATIVE
Blood, UA: NEGATIVE
Glucose, UA: NEGATIVE
Ketones, UA: NEGATIVE
Leukocytes, UA: NEGATIVE
Nitrite, UA: NEGATIVE
Protein, UA: NEGATIVE
Spec Grav, UA: 1.01 (ref 1.010–1.025)
Urobilinogen, UA: 0.2 E.U./dL
pH, UA: 6.5 (ref 5.0–8.0)

## 2018-11-16 LAB — POCT UA - MICROALBUMIN
Albumin/Creatinine Ratio, Urine, POC: 30
Creatinine, POC: 50 mg/dL
Microalbumin Ur, POC: 10 mg/L

## 2018-11-16 NOTE — Patient Instructions (Signed)

## 2018-11-16 NOTE — Progress Notes (Addendum)
Subjective:     Patient ID: Andrew Fisher , male    DOB: May 26, 1955 , 63 y.o.   MRN: [id number]   Chief Complaint  Patient presents with  . Annual Exam  . Hyperlipidemia    HPI  He is here today for a full physical examination. He is also followed by Urology for his prostate exams.  He reports compliance with chol meds. He has not had any issues with atorvastatin. He reports also decreasing his fried food intake and exercising on a more regular basis.     Past Medical History:  Diagnosis Date  . Arthritis   . Asthma   . GERD (gastroesophageal reflux disease)   . Hearing loss   . Hemorrhoids   . Hyperlipidemia   . Low testosterone in male   . Peripheral neuropathy   . Sleep apnea    wears cpap  . Sore throat   . Wears glasses   . Wears hearing aid in both ears      Family History  Problem Relation Age of Onset  . Diabetes Mother   . Hypertension Mother   . Healthy Father   . Cancer Other      Current Outpatient Medications:  .  albuterol (PROAIR HFA) 108 (90 Base) MCG/ACT inhaler, Inhale 1-2 puffs into the lungs every 6 (six) hours as needed for wheezing or shortness of breath., Disp: 1 Inhaler, Rfl: 3 .  aspirin EC 81 MG tablet, Take 81 mg by mouth daily., Disp: , Rfl:  .  atorvastatin (LIPITOR) 10 MG tablet, TAKE 1 TABLET DAILY, Disp: 90 tablet, Rfl: 2 .  BELBUCA 75 MCG FILM, , Disp: , Rfl:  .  Cholecalciferol (VITAMIN D3 PO), Take 2,000 Units by mouth. , Disp: , Rfl:  .  fluticasone (FLONASE) 50 MCG/ACT nasal spray, , Disp: , Rfl:  .  gabapentin (NEURONTIN) 300 MG capsule, Take 300 mg by mouth 4 (four) times daily., Disp: , Rfl:  .  lansoprazole (PREVACID) 30 MG capsule, Take 30 mg by mouth daily at 12 noon., Disp: , Rfl:  .  LINZESS 145 MCG CAPS capsule, , Disp: , Rfl:  .  loratadine (CLARITIN) 10 MG tablet, Take 10 mg by mouth daily., Disp: , Rfl:  .  magnesium oxide (MAG-OX) 400 MG tablet, Take 400 mg by mouth daily., Disp: , Rfl:  .  montelukast  (SINGULAIR) 10 MG tablet, TAKE ONE (1) TABLET BY MOUTH EVERY DAY, Disp: 30 tablet, Rfl: 5 .  Multiple Vitamins-Minerals (MULTIVITAMIN PO), Take 1 tablet by mouth daily., Disp: , Rfl:  .  tadalafil (CIALIS) 5 MG tablet, Take by mouth., Disp: , Rfl:  .  tamsulosin (FLOMAX) 0.4 MG CAPS capsule, Take 0.4 mg by mouth daily. , Disp: , Rfl:    No Known Allergies   Review of Systems  Constitutional: Positive for fatigue.  HENT: Negative.   Eyes: Negative.   Respiratory: Negative.   Cardiovascular: Negative.   Gastrointestinal: Negative.   Endocrine: Negative.   Genitourinary: Negative.   Musculoskeletal: Positive for arthralgias.       He c/o left elbow pain and left foot pain. He denies fall/trauma. He is unable state what triggers his pain.   Skin: Negative.   Allergic/Immunologic: Negative.   Neurological: Negative.   Hematological: Negative.   Psychiatric/Behavioral: Negative.      Today's Vitals   11/16/18 1121  BP: 116/82  Pulse: 66  Temp: 98 F (36.7 C)  TempSrc: Oral  Weight: 214 lb 3.2 oz (  97.2 kg)  Height: 5' 8.8" (1.748 m)  PainSc: 9   PainLoc: Arm   Body mass index is 31.82 kg/m.   Objective:  Physical Exam Vitals signs and nursing note reviewed.  Constitutional:      Appearance: Normal appearance.  HENT:     Head: Normocephalic and atraumatic.     Right Ear: Tympanic membrane, ear canal and external ear normal.     Left Ear: Tympanic membrane, ear canal and external ear normal.     Nose: Nose normal.     Mouth/Throat:     Mouth: Mucous membranes are moist.     Pharynx: Oropharynx is clear.  Eyes:     Extraocular Movements: Extraocular movements intact.     Conjunctiva/sclera: Conjunctivae normal.     Pupils: Pupils are equal, round, and reactive to light.  Neck:     Musculoskeletal: Normal range of motion and neck supple.  Cardiovascular:     Rate and Rhythm: Normal rate and regular rhythm.     Pulses: Normal pulses.     Heart sounds: Normal heart  sounds.  Pulmonary:     Effort: Pulmonary effort is normal.     Breath sounds: Normal breath sounds.  Chest:     Breasts:        Right: Normal. No swelling, bleeding, inverted nipple, mass or nipple discharge.        Left: Normal. No swelling, bleeding, inverted nipple, mass or nipple discharge.  Abdominal:     General: Abdomen is flat. Bowel sounds are normal.     Palpations: Abdomen is soft.  Genitourinary:    Comments: deferred Musculoskeletal: Normal range of motion.  Skin:    General: Skin is warm.  Neurological:     General: No focal deficit present.     Mental Status: He is alert.  Psychiatric:        Mood and Affect: Mood normal.        Behavior: Behavior normal.         Assessment And Plan:     1. Routine general medical examination at a health care facility  A full exam was performed. DRE not performed, he is followed by Urology. PATIENT HAS BEEN ADVISED TO GET 30-45 MINUTES REGULAR EXERCISE NO LESS THAN FOUR TO FIVE DAYS PER WEEK - BOTH WEIGHTBEARING EXERCISES AND AEROBIC ARE RECOMMENDED.  HE WAS ADVISED TO FOLLOW A HEALTHY DIET WITH AT LEAST SIX FRUITS/VEGGIES PER DAY, DECREASE INTAKE OF RED MEAT, AND TO INCREASE FISH INTAKE TO TWO DAYS PER WEEK.  MEATS/FISH SHOULD NOT BE FRIED, BAKED OR BROILED IS PREFERABLE.  I SUGGEST WEARING SPF 50 SUNSCREEN ON EXPOSED PARTS AND ESPECIALLY WHEN IN THE DIRECT SUNLIGHT FOR AN EXTENDED PERIOD OF TIME.  PLEASE AVOID FAST FOOD RESTAURANTS AND INCREASE YOUR WATER INTAKE.   2. Pure hypercholesterolemia  Chronic. I will check fasting lipid panel. He is encouraged to avoid fried foods and to incorporate more exercise into his daily routine. He has also requested a letter giving him permission to return to the [facility].   - Lipid panel  3. Fatigue, unspecified type  This is possibly related to his hypogonadism. He does not recall if Urologist checked his testosterone levels. I will check these today along with other labs as listed below. He  is encouraged to drink no less than 64 ounces of water today.   - CMP14+EGFR - CBC - Testosterone,Free and Total  4. Other abnormal glucose  HIS A1C HAS BEEN ELEVATED IN THE  PAST. I WILL CHECK AN A1C, BMET TODAY. HE WAS ENCOURAGED TO AVOID SUGARY BEVERAGES AND PROCESSED FOODS INCLUDNG BREADS, RICE AND PASTA.  - Hemoglobin A1c - POCT Urinalysis Dipstick (81002) - POCT UA - Microalbumin  5. Left foot pain  I will refer him to Podiatry for further evaluation.   - Ambulatory referral to Podiatry  6. Left elbow pain  His sx are suggestive of lateral epicondylitis.  He is advised to wear sleeve and use topical pain cream prn. He will let me know if his sx persist.   7. Mild intermittent asthma without complication  Chronic. He has been seen by Pulmonary in the past at [facility]. I will transfer his records from [place] to [facility].   8. Encounter for immunization  - Flu Vaccine QUAD 6+ mos PF IM (Fluarix Quad PF)  9. Encounter for screening  COVID testing performed at his request.   - Novel Coronavirus, NAA ([facility])    [name], MD    THE PATIENT IS ENCOURAGED TO PRACTICE SOCIAL DISTANCING DUE TO THE COVID-19 PANDEMIC.

## 2018-11-17 LAB — NOVEL CORONAVIRUS, NAA: SARS-CoV-2, NAA: NOT DETECTED

## 2018-11-18 ENCOUNTER — Encounter: Payer: Self-pay | Admitting: Internal Medicine

## 2018-11-18 ENCOUNTER — Other Ambulatory Visit: Payer: Self-pay

## 2018-11-18 MED ORDER — EPINEPHRINE 0.3 MG/0.3ML IJ SOAJ: 0.3000 mg | INTRAMUSCULAR | 2 refills | Status: DC | PRN

## 2018-11-18 NOTE — Telephone Encounter (Signed)
The pt came to the office with hives on his left arm and wanted to know what he should do.  The pt ws told that Dr. Baird Cancer said to take some benadryl and if he doesn't get better to contact the office for an appt and also if the pt has trouble breathing to go to the ER.

## 2018-11-19 ENCOUNTER — Ambulatory Visit (INDEPENDENT_AMBULATORY_CARE_PROVIDER_SITE_OTHER): Payer: POS | Admitting: Internal Medicine

## 2018-11-19 ENCOUNTER — Other Ambulatory Visit: Payer: Self-pay

## 2018-11-19 ENCOUNTER — Encounter: Payer: Self-pay | Admitting: Internal Medicine

## 2018-11-19 VITALS — Temp 97.7°F | Ht 68.8 in | Wt 212.8 lb

## 2018-11-19 DIAGNOSIS — B029 Zoster without complications: Secondary | ICD-10-CM

## 2018-11-19 MED ORDER — PREDNISONE 10 MG (21) PO TBPK: ORAL_TABLET | ORAL | 0 refills | Status: DC

## 2018-11-19 MED ORDER — VALACYCLOVIR HCL 1 G PO TABS: 1000.0000 mg | ORAL_TABLET | Freq: Three times a day (TID) | ORAL | 0 refills | Status: AC

## 2018-11-19 NOTE — Progress Notes (Signed)
Subjective:     Patient ID: Andrew Fisher , male    DOB: June 01, 1955 , 63 y.o.   MRN: [id number]   Chief Complaint  Patient presents with  . Rash    HPI  Pain of L arm started 4 days ago, then the next day developed a cluster of blisters on his L upper back and L inner upper arm. Yesterday pm developed cluster of blisters on L inner forearm, and this am on central palm and below middle finger. Yesterday his L ring finger was aching and thought it was related to his ring, so he took it off. Has moderate pain from this. Denies being under a lot of stress or been sick in the past few week. Has had chicken pox as child.   Past Medical History:  Diagnosis Date  . Arthritis   . Asthma   . GERD (gastroesophageal reflux disease)   . Hearing loss   . Hemorrhoids   . Hyperlipidemia   . Low testosterone in male   . Peripheral neuropathy   . Sleep apnea    wears cpap  . Sore throat   . Wears glasses   . Wears hearing aid in both ears      Family History  Problem Relation Age of Onset  . Diabetes Mother   . Hypertension Mother   . Healthy Father   . Cancer Other      Current Outpatient Medications:  .  albuterol (PROAIR HFA) 108 (90 Base) MCG/ACT inhaler, Inhale 1-2 puffs into the lungs every 6 (six) hours as needed for wheezing or shortness of breath., Disp: 1 Inhaler, Rfl: 3 .  aspirin EC 81 MG tablet, Take 81 mg by mouth daily., Disp: , Rfl:  .  atorvastatin (LIPITOR) 10 MG tablet, TAKE 1 TABLET DAILY, Disp: 90 tablet, Rfl: 2 .  BELBUCA 75 MCG FILM, , Disp: , Rfl:  .  Cholecalciferol (VITAMIN D3 PO), Take 2,000 Units by mouth. , Disp: , Rfl:  .  EPINEPHrine 0.3 mg/0.3 mL IJ SOAJ injection, Inject 0.3 mLs (0.3 mg total) into the muscle as needed for anaphylaxis., Disp: 1 each, Rfl: 2 .  fluticasone (FLONASE) 50 MCG/ACT nasal spray, , Disp: , Rfl:  .  gabapentin (NEURONTIN) 300 MG capsule, Take 300 mg by mouth 4 (four) times daily., Disp: , Rfl:  .  lansoprazole (PREVACID) 30  MG capsule, Take 30 mg by mouth daily at 12 noon., Disp: , Rfl:  .  LINZESS 145 MCG CAPS capsule, , Disp: , Rfl:  .  loratadine (CLARITIN) 10 MG tablet, Take 10 mg by mouth daily., Disp: , Rfl:  .  magnesium oxide (MAG-OX) 400 MG tablet, Take 400 mg by mouth daily., Disp: , Rfl:  .  montelukast (SINGULAIR) 10 MG tablet, TAKE ONE (1) TABLET BY MOUTH EVERY DAY, Disp: 30 tablet, Rfl: 5 .  Multiple Vitamins-Minerals (MULTIVITAMIN PO), Take 1 tablet by mouth daily., Disp: , Rfl:  .  tadalafil (CIALIS) 5 MG tablet, Take by mouth., Disp: , Rfl:  .  tamsulosin (FLOMAX) 0.4 MG CAPS capsule, Take 0.4 mg by mouth daily. , Disp: , Rfl:    No Known Allergies   Review of Systems  Pain, burning sensation on L upper back and L inner L arm and hand. No fever, chills or sweats. Has chronic low back      Objective:  Physical Exam Vitals signs and nursing note reviewed.  Constitutional:      General: He is not in  acute distress.    Appearance: Normal appearance. He is not toxic-appearing.  HENT:     Head: Normocephalic.     Right Ear: External ear normal.     Left Ear: External ear normal.     Nose: Nose normal.  Eyes:     General: No scleral icterus.    Conjunctiva/sclera: Conjunctivae normal.  Neck:     Musculoskeletal: Neck supple.  Pulmonary:     Effort: Pulmonary effort is normal.  Skin:    General: Skin is warm.     Findings: Rash present.     Comments: HAS CLUSTER OF VESICLES  On T1, C7& C8 on palm only dermatome  Neurological:     Mental Status: He is alert and oriented to person, place, and time.     Gait: Gait normal.  Psychiatric:        Mood and Affect: Mood normal.        Behavior: Behavior normal.        Thought Content: Thought content normal.        Judgment: Judgment normal.     Today's Vitals   11/19/18 1003  Temp: 97.7 F (36.5 C)  TempSrc: Oral  Weight: 212 lb 12.8 oz (96.5 kg)  Height: 5' 8.8" (1.748 m)  PainSc: 9    Body mass index is 31.61 kg/m.;L     Assessment And Plan:    1. Herpes zoster without complication- acute    Was placed on Prednisone taper and Valtrex 1G tid x 10 days.   FU Prn    Rodrick Payson RODRIGUEZ-SOUTHWORTH, PA-C    THE PATIENT IS ENCOURAGED TO PRACTICE SOCIAL DISTANCING DUE TO THE COVID-19 PANDEMIC.

## 2018-11-20 LAB — CMP14+EGFR
ALT: 29 IU/L (ref 0–44)
AST: 30 IU/L (ref 0–40)
Albumin/Globulin Ratio: 1.7 (ref 1.2–2.2)
Albumin: 4.5 g/dL (ref 3.8–4.8)
Alkaline Phosphatase: 77 IU/L (ref 39–117)
BUN/Creatinine Ratio: 14 (ref 10–24)
BUN: 18 mg/dL (ref 8–27)
Bilirubin Total: 0.6 mg/dL (ref 0.0–1.2)
CO2: 18 mmol/L — ABNORMAL LOW (ref 20–29)
Calcium: 9.1 mg/dL (ref 8.6–10.2)
Chloride: 104 mmol/L (ref 96–106)
Creatinine, Ser: 1.28 mg/dL — ABNORMAL HIGH (ref 0.76–1.27)
GFR calc Af Amer: 68 mL/min/{1.73_m2} (ref >59)
GFR calc non Af Amer: 59 mL/min/{1.73_m2} — ABNORMAL LOW (ref >59)
Globulin, Total: 2.7 g/dL (ref 1.5–4.5)
Glucose: 99 mg/dL (ref 65–99)
Potassium: 4.9 mmol/L (ref 3.5–5.2)
Sodium: 138 mmol/L (ref 134–144)
Total Protein: 7.2 g/dL (ref 6.0–8.5)

## 2018-11-20 LAB — CBC
Hematocrit: 45.6 % (ref 37.5–51.0)
Hemoglobin: 14.1 g/dL (ref 13.0–17.7)
MCH: 21.9 pg — ABNORMAL LOW (ref 26.6–33.0)
MCHC: 30.9 g/dL — ABNORMAL LOW (ref 31.5–35.7)
MCV: 71 fL — ABNORMAL LOW (ref 79–97)
Platelets: 178 10*3/uL (ref 150–450)
RBC: 6.43 x10E6/uL — ABNORMAL HIGH (ref 4.14–5.80)
RDW: 18.8 % — ABNORMAL HIGH (ref 11.6–15.4)
WBC: 3.1 10*3/uL — ABNORMAL LOW (ref 3.4–10.8)

## 2018-11-20 LAB — LIPID PANEL
Chol/HDL Ratio: 3.3 ratio (ref 0.0–5.0)
Cholesterol, Total: 130 mg/dL (ref 100–199)
HDL: 39 mg/dL — ABNORMAL LOW (ref >39)
LDL Calculated: 77 mg/dL (ref 0–99)
Triglycerides: 72 mg/dL (ref 0–149)
VLDL Cholesterol Cal: 14 mg/dL (ref 5–40)

## 2018-11-20 LAB — TESTOSTERONE,FREE AND TOTAL
Testosterone, Free: 11 pg/mL (ref 6.6–18.1)
Testosterone: 597 ng/dL (ref 264–916)

## 2018-11-20 LAB — HEMOGLOBIN A1C
Est. average glucose Bld gHb Est-mCnc: 117 mg/dL
Hgb A1c MFr Bld: 5.7 % — ABNORMAL HIGH (ref 4.8–5.6)

## 2018-11-28 ENCOUNTER — Encounter: Payer: Self-pay | Admitting: Internal Medicine

## 2018-12-01 ENCOUNTER — Ambulatory Visit: Payer: POS | Admitting: Sports Medicine

## 2018-12-29 ENCOUNTER — Ambulatory Visit: Payer: POS | Admitting: Sports Medicine

## 2018-12-29 ENCOUNTER — Encounter: Payer: Self-pay | Admitting: Internal Medicine

## 2019-02-08 ENCOUNTER — Ambulatory Visit (INDEPENDENT_AMBULATORY_CARE_PROVIDER_SITE_OTHER): Payer: POS | Admitting: Internal Medicine

## 2019-02-08 ENCOUNTER — Other Ambulatory Visit: Payer: Self-pay

## 2019-02-08 ENCOUNTER — Encounter: Payer: Self-pay | Admitting: Internal Medicine

## 2019-02-08 VITALS — BP 130/72 | HR 70 | Temp 98.8°F | Ht 69.4 in | Wt 212.8 lb

## 2019-02-08 DIAGNOSIS — Z6831 Body mass index (BMI) 31.0-31.9, adult: Secondary | ICD-10-CM

## 2019-02-08 DIAGNOSIS — E7800 Pure hypercholesterolemia, unspecified: Secondary | ICD-10-CM

## 2019-02-08 DIAGNOSIS — L821 Other seborrheic keratosis: Secondary | ICD-10-CM

## 2019-02-08 DIAGNOSIS — E291 Testicular hypofunction: Secondary | ICD-10-CM

## 2019-02-08 DIAGNOSIS — H9202 Otalgia, left ear: Secondary | ICD-10-CM

## 2019-02-08 DIAGNOSIS — E6609 Other obesity due to excess calories: Secondary | ICD-10-CM

## 2019-02-08 DIAGNOSIS — H6122 Impacted cerumen, left ear: Secondary | ICD-10-CM

## 2019-02-08 NOTE — Patient Instructions (Signed)
Earwax Buildup, Adult The ears produce a substance called earwax that helps keep bacteria out of the ear and protects the skin in the ear canal. Occasionally, earwax can build up in the ear and cause discomfort or hearing loss. What increases the risk? This condition is more likely to develop in people who:  Are male.  Are elderly.  Naturally produce more earwax.  Clean their ears often with cotton swabs.  Use earplugs often.  Use in-ear headphones often.  Wear hearing aids.  Have narrow ear canals.  Have earwax that is overly thick or sticky.  Have eczema.  Are dehydrated.  Have excess hair in the ear canal. What are the signs or symptoms? Symptoms of this condition include:  Reduced or muffled hearing.  A feeling of fullness in the ear or feeling that the ear is plugged.  Fluid coming from the ear.  Ear pain.  Ear itch.  Ringing in the ear.  Coughing.  An obvious piece of earwax that can be seen inside the ear canal. How is this diagnosed? This condition may be diagnosed based on:  Your symptoms.  Your medical history.  An ear exam. During the exam, your health care provider will look into your ear with an instrument called an otoscope. You may have tests, including a hearing test. How is this treated? This condition may be treated by:  Using ear drops to soften the earwax.  Having the earwax removed by a health care provider. The health care provider may: ? Flush the ear with water. ? Use an instrument that has a loop on the end (curette). ? Use a suction device.  Surgery to remove the wax buildup. This may be done in severe cases. Follow these instructions at home:   Take over-the-counter and prescription medicines only as told by your health care provider.  Do not put any objects, including cotton swabs, into your ear. You can clean the opening of your ear canal with a washcloth or facial tissue.  Follow instructions from your health care  provider about cleaning your ears. Do not over-clean your ears.  Drink enough fluid to keep your urine clear or pale yellow. This will help to thin the earwax.  Keep all follow-up visits as told by your health care provider. If earwax builds up in your ears often or if you use hearing aids, consider seeing your health care provider for routine, preventive ear cleanings. Ask your health care provider how often you should schedule your cleanings.  If you have hearing aids, clean them according to instructions from the manufacturer and your health care provider. Contact a health care provider if:  You have ear pain.  You develop a fever.  You have blood, pus, or other fluid coming from your ear.  You have hearing loss.  You have ringing in your ears that does not go away.  Your symptoms do not improve with treatment.  You feel like the room is spinning (vertigo). Summary  Earwax can build up in the ear and cause discomfort or hearing loss.  The most common symptoms of this condition include reduced or muffled hearing and a feeling of fullness in the ear or feeling that the ear is plugged.  This condition may be diagnosed based on your symptoms, your medical history, and an ear exam.  This condition may be treated by using ear drops to soften the earwax or by having the earwax removed by a health care provider.  Do not put any   objects, including cotton swabs, into your ear. You can clean the opening of your ear canal with a washcloth or facial tissue. This information is not intended to replace advice given to you by your health care provider. Make sure you discuss any questions you have with your health care provider. Document Released: 04/18/2004 Document Revised: 02/21/2017 Document Reviewed: 05/22/2016 Elsevier Patient Education  2020 [employer] Inc[employer]  

## 2019-02-08 NOTE — Progress Notes (Signed)
Subjective:     Patient ID: Andrew Fisher , male    DOB: 02-17-1956 , 63 y.o.   MRN: [id number]   Chief Complaint  Patient presents with  . Hyperlipidemia  . Otalgia    blood in ear     HPI  He is here today for a cholesterol check. He is also concerned about right ear pain. Symptoms started about 2 months ago. He put Q-tip in ear and saw blood on the tip. He never had blood coming out of the ear. Denies sore throat. Wears hearing aids b/l.  Hyperlipidemia This is a chronic problem. The problem is controlled. Current antihyperlipidemic treatment includes statins. The current treatment provides moderate improvement of lipids. Compliance problems include adherence to exercise.  Risk factors for coronary artery disease include male sex, a sedentary lifestyle and obesity.  Otalgia  There is pain in the left ear. This is a recurrent problem. The current episode started 1 to 4 weeks ago. The problem has been gradually improving. There has been no fever. The pain is at a severity of 4/10. The pain is moderate. Pertinent negatives include no coughing, rhinorrhea or sore throat. He has tried nothing for the symptoms. His past medical history is significant for hearing loss.     Past Medical History:  Diagnosis Date  . Arthritis   . Asthma   . GERD (gastroesophageal reflux disease)   . Hearing loss   . Hemorrhoids   . Hyperlipidemia   . Low testosterone in male   . Peripheral neuropathy   . Sleep apnea    wears cpap  . Sore throat   . Wears glasses   . Wears hearing aid in both ears      Family History  Problem Relation Age of Onset  . Diabetes Mother   . Hypertension Mother   . Healthy Father   . Cancer Other      Current Outpatient Medications:  .  albuterol (PROAIR HFA) 108 (90 Base) MCG/ACT inhaler, Inhale 1-2 puffs into the lungs every 6 (six) hours as needed for wheezing or shortness of breath., Disp: 1 Inhaler, Rfl: 3 .  aspirin EC 81 MG tablet, Take 81 mg by mouth  daily., Disp: , Rfl:  .  atorvastatin (LIPITOR) 10 MG tablet, TAKE 1 TABLET DAILY, Disp: 90 tablet, Rfl: 2 .  BELBUCA 75 MCG FILM, , Disp: , Rfl:  .  Cholecalciferol (VITAMIN D3 PO), Take 2,000 Units by mouth. , Disp: , Rfl:  .  EPINEPHrine 0.3 mg/0.3 mL IJ SOAJ injection, Inject 0.3 mLs (0.3 mg total) into the muscle as needed for anaphylaxis., Disp: 1 each, Rfl: 2 .  fluticasone (FLONASE) 50 MCG/ACT nasal spray, , Disp: , Rfl:  .  gabapentin (NEURONTIN) 300 MG capsule, Take 300 mg by mouth 4 (four) times daily., Disp: , Rfl:  .  lansoprazole (PREVACID) 30 MG capsule, Take 30 mg by mouth daily at 12 noon., Disp: , Rfl:  .  LINZESS 145 MCG CAPS capsule, , Disp: , Rfl:  .  loratadine (CLARITIN) 10 MG tablet, Take 10 mg by mouth daily., Disp: , Rfl:  .  magnesium oxide (MAG-OX) 400 MG tablet, Take 400 mg by mouth daily., Disp: , Rfl:  .  montelukast (SINGULAIR) 10 MG tablet, TAKE ONE (1) TABLET BY MOUTH EVERY DAY, Disp: 30 tablet, Rfl: 5 .  Multiple Vitamins-Minerals (MULTIVITAMIN PO), Take 1 tablet by mouth daily., Disp: , Rfl:  .  tadalafil (CIALIS) 5 MG tablet, Take by  mouth., Disp: , Rfl:  .  tamsulosin (FLOMAX) 0.4 MG CAPS capsule, Take 0.4 mg by mouth daily. , Disp: , Rfl:  .  urea (CARMOL) 20 % cream, Apply topically as needed., Disp: , Rfl:  .  predniSONE (STERAPRED UNI-PAK 21 TAB) 10 MG (21) TBPK tablet, Take as directed per package, Disp: 1 each, Rfl: 0   No Known Allergies   Review of Systems  Constitutional: Negative.   HENT: Positive for ear pain. Negative for rhinorrhea and sore throat.   Respiratory: Negative.  Negative for cough.   Cardiovascular: Negative.   Gastrointestinal: Negative.   Neurological: Negative.   Psychiatric/Behavioral: Negative.      Today's Vitals   02/08/19 1132  BP: 130/72  Pulse: 70  Temp: 98.8 F (37.1 C)  TempSrc: Oral  Weight: 212 lb 12.8 oz (96.5 kg)  Height: 5' 9.4" (1.763 m)   Body mass index is 31.06 kg/m.   Objective:  Physical  Exam Vitals signs and nursing note reviewed.  Constitutional:      Appearance: Normal appearance.  HENT:     Right Ear: Tympanic membrane, ear canal and external ear normal.     Left Ear: Ear canal and external ear normal.     Ears:     Comments: Cerumen excess on left. He wears hearing aids b/l Cardiovascular:     Rate and Rhythm: Normal rate and regular rhythm.     Heart sounds: Normal heart sounds.  Pulmonary:     Effort: Pulmonary effort is normal.     Breath sounds: Normal breath sounds.  Skin:    General: Skin is warm.  Neurological:     General: No focal deficit present.     Mental Status: He is alert.  Psychiatric:        Mood and Affect: Mood normal.         Assessment And Plan:     1. Pure hypercholesterolemia  Chronic. Results from August 2020 were reviewed in full detail. He will continue with current meds. I will recheck lipid panel in Feb 2021. He is encouraged to incorporate more exercise into his daily routine.   2. Otalgia of left ear  Resolving. No need for further treatment at this time.  3. Excessive cerumen in ear canal, left  AFTER OBTAINING VERBAL CONSENT, LEFT EAR WAS FLUSHED BY IRRIGATION. HE TOLERATED PROCEDURE WELL WITHOUT ANY COMPLICATIONS. NO TM ABNORMALITIES WERE NOTED.  - Ear Lavage  4. Hypogonadism in male  Chronic. He reports compliance with supplementation. He is also followed by Urology.   5. Dermatosis papulosa nigra  I will refer him to Derm as requested for removal.   - Ambulatory referral to Dermatology  6. Class 1 obesity due to excess calories with serious comorbidity and body mass index (BMI) of 31.0 to 31.9 in adult  Importance of achieving optimal weight to decrease risk of cardiovascular disease and cancers was discussed with the patient in full detail. He is encouraged to start slowly - start with 10 minutes twice daily at least three to four days per week and to gradually build to 30 minutes five days weekly. He was  given tips to incorporate more activity into her daily routine - take stairs when possible, park farther away from grocery stores, etc.     [name], MD    THE PATIENT IS ENCOURAGED TO PRACTICE SOCIAL DISTANCING DUE TO THE COVID-19 PANDEMIC.

## 2019-02-17 ENCOUNTER — Ambulatory Visit: Payer: 59 | Admitting: Internal Medicine

## 2019-05-16 ENCOUNTER — Other Ambulatory Visit: Payer: Self-pay | Admitting: Internal Medicine

## 2019-05-19 ENCOUNTER — Other Ambulatory Visit: Payer: Self-pay

## 2019-05-19 ENCOUNTER — Encounter: Payer: Self-pay | Admitting: Internal Medicine

## 2019-05-19 ENCOUNTER — Ambulatory Visit (INDEPENDENT_AMBULATORY_CARE_PROVIDER_SITE_OTHER): Payer: POS | Admitting: Internal Medicine

## 2019-05-19 VITALS — BP 120/82 | HR 73 | Temp 97.4°F | Ht 69.4 in | Wt 206.8 lb

## 2019-05-19 DIAGNOSIS — R7309 Other abnormal glucose: Secondary | ICD-10-CM

## 2019-05-19 DIAGNOSIS — E7800 Pure hypercholesterolemia, unspecified: Secondary | ICD-10-CM | POA: Diagnosis not present

## 2019-05-19 DIAGNOSIS — E291 Testicular hypofunction: Secondary | ICD-10-CM

## 2019-05-19 DIAGNOSIS — M79641 Pain in right hand: Secondary | ICD-10-CM | POA: Diagnosis not present

## 2019-05-19 DIAGNOSIS — M79642 Pain in left hand: Secondary | ICD-10-CM

## 2019-05-19 DIAGNOSIS — Z79899 Other long term (current) drug therapy: Secondary | ICD-10-CM

## 2019-05-19 MED ORDER — ALBUTEROL SULFATE HFA 108 (90 BASE) MCG/ACT IN AERS: 1.0000 | INHALATION_SPRAY | Freq: Four times a day (QID) | RESPIRATORY_TRACT | 1 refills | Status: DC | PRN

## 2019-05-19 NOTE — Patient Instructions (Signed)
Carpal Tunnel Syndrome  Carpal tunnel syndrome is a condition that causes pain in your hand and arm. The carpal tunnel is a narrow area that is on the palm side of your wrist. Repeated wrist motion or certain diseases may cause swelling in the tunnel. This swelling can pinch the main nerve in the wrist (median nerve). What are the causes? This condition may be caused by:  Repeated wrist motions.  Wrist injuries.  Arthritis.  A sac of fluid (cyst) or abnormal growth (tumor) in the carpal tunnel.  Fluid buildup during pregnancy. Sometimes the cause is not known. What increases the risk? The following factors may make you more likely to develop this condition:  Having a job in which you move your wrist in the same way many times. This includes jobs like being a butcher or a cashier.  Being a woman.  Having other health conditions, such as: ? Diabetes. ? Obesity. ? A thyroid gland that is not active enough (hypothyroidism). ? Kidney failure. What are the signs or symptoms? Symptoms of this condition include:  A tingling feeling in your fingers.  Tingling or a loss of feeling (numbness) in your hand.  Pain in your entire arm. This pain may get worse when you bend your wrist and elbow for a long time.  Pain in your wrist that goes up your arm to your shoulder.  Pain that goes down into your palm or fingers.  A weak feeling in your hands. You may find it hard to grab and hold items. You may feel worse at night. How is this diagnosed? This condition is diagnosed with a medical history and physical exam. You may also have tests, such as:  Electromyogram (EMG). This test checks the signals that the nerves send to the muscles.  Nerve conduction study. This test checks how well signals pass through your nerves.  Imaging tests, such as X-rays, ultrasound, and MRI. These tests check for what might be the cause of your condition. How is this treated? This condition may be treated  with:  Lifestyle changes. You will be asked to stop or change the activity that caused your problem.  Doing exercise and activities that make bones and muscles stronger (physical therapy).  Learning how to use your hand again (occupational therapy).  Medicines for pain and swelling (inflammation). You may have injections in your wrist.  A wrist splint.  Surgery. Follow these instructions at home: If you have a splint:  Wear the splint as told by your doctor. Remove it only as told by your doctor.  Loosen the splint if your fingers: ? Tingle. ? Lose feeling (become numb). ? Turn cold and blue.  Keep the splint clean.  If the splint is not waterproof: ? Do not let it get wet. ? Cover it with a watertight covering when you take a bath or a shower. Managing pain, stiffness, and swelling   If told, put ice on the painful area: ? If you have a removable splint, remove it as told by your doctor. ? Put ice in a plastic bag. ? Place a towel between your skin and the bag. ? Leave the ice on for 20 minutes, 2-3 times per day. General instructions  Take over-the-counter and prescription medicines only as told by your doctor.  Rest your wrist from any activity that may cause pain. If needed, talk with your boss at work about changes that can help your wrist heal.  Do any exercises as told by your doctor,   physical therapist, or occupational therapist.  Keep all follow-up visits as told by your doctor. This is important. Contact a doctor if:  You have new symptoms.  Medicine does not help your pain.  Your symptoms get worse. Get help right away if:  You have very bad numbness or tingling in your wrist or hand. Summary  Carpal tunnel syndrome is a condition that causes pain in your hand and arm.  It is often caused by repeated wrist motions.  Lifestyle changes and medicines are used to treat this problem. Surgery may help in very bad cases.  Follow your doctor's  instructions about wearing a splint, resting your wrist, keeping follow-up visits, and calling for help. This information is not intended to replace advice given to you by your health care provider. Make sure you discuss any questions you have with your health care provider. Document Revised: 07/18/2017 Document Reviewed: 07/18/2017 Elsevier Patient Education  2020 [employer] Inc.  

## 2019-05-20 LAB — LIPID PANEL
Chol/HDL Ratio: 3.3 ratio (ref 0.0–5.0)
Cholesterol, Total: 138 mg/dL (ref 100–199)
HDL: 42 mg/dL (ref >39)
LDL Chol Calc (NIH): 81 mg/dL (ref 0–99)
Triglycerides: 77 mg/dL (ref 0–149)
VLDL Cholesterol Cal: 15 mg/dL (ref 5–40)

## 2019-05-20 LAB — CBC
Hematocrit: 45 % (ref 37.5–51.0)
Hemoglobin: 14.4 g/dL (ref 13.0–17.7)
MCH: 23.6 pg — ABNORMAL LOW (ref 26.6–33.0)
MCHC: 32 g/dL (ref 31.5–35.7)
MCV: 74 fL — ABNORMAL LOW (ref 79–97)
Platelets: 127 10*3/uL — ABNORMAL LOW (ref 150–450)
RBC: 6.09 x10E6/uL — ABNORMAL HIGH (ref 4.14–5.80)
RDW: 18.5 % — ABNORMAL HIGH (ref 11.6–15.4)
WBC: 2.6 10*3/uL — ABNORMAL LOW (ref 3.4–10.8)

## 2019-05-20 LAB — CMP14+EGFR
ALT: 17 IU/L (ref 0–44)
AST: 18 IU/L (ref 0–40)
Albumin/Globulin Ratio: 1.9 (ref 1.2–2.2)
Albumin: 4.5 g/dL (ref 3.8–4.8)
Alkaline Phosphatase: 70 IU/L (ref 39–117)
BUN/Creatinine Ratio: 12 (ref 10–24)
BUN: 14 mg/dL (ref 8–27)
Bilirubin Total: 0.6 mg/dL (ref 0.0–1.2)
CO2: 23 mmol/L (ref 20–29)
Calcium: 9.2 mg/dL (ref 8.6–10.2)
Chloride: 101 mmol/L (ref 96–106)
Creatinine, Ser: 1.13 mg/dL (ref 0.76–1.27)
GFR calc Af Amer: 79 mL/min/{1.73_m2} (ref >59)
GFR calc non Af Amer: 68 mL/min/{1.73_m2} (ref >59)
Globulin, Total: 2.4 g/dL (ref 1.5–4.5)
Glucose: 98 mg/dL (ref 65–99)
Potassium: 4.2 mmol/L (ref 3.5–5.2)
Sodium: 138 mmol/L (ref 134–144)
Total Protein: 6.9 g/dL (ref 6.0–8.5)

## 2019-05-20 LAB — HEMOGLOBIN A1C
Est. average glucose Bld gHb Est-mCnc: 120 mg/dL
Hgb A1c MFr Bld: 5.8 % — ABNORMAL HIGH (ref 4.8–5.6)

## 2019-05-20 LAB — TESTOSTERONE,FREE AND TOTAL
Testosterone, Free: 14.8 pg/mL (ref 6.6–18.1)
Testosterone: 633 ng/dL (ref 264–916)

## 2019-05-22 NOTE — Progress Notes (Signed)
This visit occurred during the SARS-CoV-2 public health emergency.  Safety protocols were in place, including screening questions prior to the visit, additional usage of staff PPE, and extensive cleaning of exam room while observing appropriate contact time as indicated for disinfecting solutions.  Subjective:     Patient ID: Andrew Fisher , male    DOB: 04-09-1955 , 64 y.o.   MRN: [id number]   Chief Complaint  Patient presents with  . Hyperlipidemia  . Arm Pain    bilateral    HPI  Hyperlipidemia This is a chronic problem. The problem is controlled. Exacerbating diseases include obesity. Current antihyperlipidemic treatment includes statins. The current treatment provides moderate improvement of lipids.  Arm Pain  The incident occurred more than 1 week ago. There was no injury mechanism. The pain is present in the left hand and right hand. The quality of the pain is described as burning and stabbing. The pain radiates to the right arm. The pain is at a severity of 6/10. The pain is moderate. The symptoms are aggravated by movement. He has tried acetaminophen for the symptoms. The treatment provided mild relief.     Past Medical History:  Diagnosis Date  . Arthritis   . Asthma   . GERD (gastroesophageal reflux disease)   . Hearing loss   . Hemorrhoids   . Hyperlipidemia   . Low testosterone in male   . Peripheral neuropathy   . Sleep apnea    wears cpap  . Sore throat   . Wears glasses   . Wears hearing aid in both ears      Family History  Problem Relation Age of Onset  . Diabetes Mother   . Hypertension Mother   . Healthy Father   . Cancer Other      Current Outpatient Medications:  .  albuterol (PROAIR HFA) 108 (90 Base) MCG/ACT inhaler, Inhale 1-2 puffs into the lungs every 6 (six) hours as needed for wheezing or shortness of breath., Disp: 18 g, Rfl: 1 .  aspirin EC 81 MG tablet, Take 81 mg by mouth daily., Disp: , Rfl:  .  atorvastatin (LIPITOR) 10 MG  tablet, TAKE 1 TABLET DAILY, Disp: 90 tablet, Rfl: 3 .  BELBUCA 75 MCG FILM, , Disp: , Rfl:  .  Cholecalciferol (VITAMIN D3 PO), Take 2,000 Units by mouth. , Disp: , Rfl:  .  EPINEPHrine 0.3 mg/0.3 mL IJ SOAJ injection, Inject 0.3 mLs (0.3 mg total) into the muscle as needed for anaphylaxis., Disp: 1 each, Rfl: 2 .  fluticasone (FLONASE) 50 MCG/ACT nasal spray, , Disp: , Rfl:  .  gabapentin (NEURONTIN) 300 MG capsule, Take 300 mg by mouth 4 (four) times daily., Disp: , Rfl:  .  lansoprazole (PREVACID) 30 MG capsule, Take 30 mg by mouth daily at 12 noon., Disp: , Rfl:  .  LINZESS 145 MCG CAPS capsule, , Disp: , Rfl:  .  loratadine (CLARITIN) 10 MG tablet, Take 10 mg by mouth daily., Disp: , Rfl:  .  magnesium oxide (MAG-OX) 400 MG tablet, Take 400 mg by mouth daily., Disp: , Rfl:  .  montelukast (SINGULAIR) 10 MG tablet, TAKE ONE (1) TABLET BY MOUTH EVERY DAY, Disp: 30 tablet, Rfl: 5 .  Multiple Vitamins-Minerals (MULTIVITAMIN PO), Take 1 tablet by mouth daily., Disp: , Rfl:  .  predniSONE (STERAPRED UNI-PAK 21 TAB) 10 MG (21) TBPK tablet, Take as directed per package, Disp: 1 each, Rfl: 0 .  tadalafil (CIALIS) 5 MG tablet, Take  by mouth., Disp: , Rfl:  .  tamsulosin (FLOMAX) 0.4 MG CAPS capsule, Take 0.4 mg by mouth daily. , Disp: , Rfl:  .  urea (CARMOL) 20 % cream, Apply topically as needed., Disp: , Rfl:    No Known Allergies   Review of Systems  Constitutional: Negative.   Respiratory: Negative.   Cardiovascular: Negative.   Gastrointestinal: Negative.   Neurological: Negative.   Psychiatric/Behavioral: Negative.      Today's Vitals   05/19/19 1132  BP: 120/82  Pulse: 73  Temp: (!) 97.4 F (36.3 C)  TempSrc: Oral  Weight: 206 lb 12.8 oz (93.8 kg)  Height: 5' 9.4" (1.763 m)  PainSc: 3   PainLoc: Arm   Body mass index is 30.19 kg/m.   Objective:  Physical Exam Vitals and nursing note reviewed.  Constitutional:      Appearance: Normal appearance.  Cardiovascular:      Rate and Rhythm: Normal rate and regular rhythm.     Heart sounds: Normal heart sounds.  Pulmonary:     Effort: Pulmonary effort is normal.     Breath sounds: Normal breath sounds.  Musculoskeletal:     Comments: Pos Tinel's sign on the right. Neg Phalen's sign b/l  Skin:    General: Skin is warm.  Neurological:     General: No focal deficit present.     Mental Status: He is alert.  Psychiatric:        Mood and Affect: Mood normal.         Assessment And Plan:     1. Pure hypercholesterolemia  Chronic. I  will check non-fasting lipid panel today. He is encouraged to avoid fried foods and to exercise no less than 150 minutes per week.   - Lipid panel  2. Bilateral hand pain  His sx are suggestive of carpal tunnel syndrome. He was given rx bilateral wrist splints to wear, esp at night. He will let me know if his sx persist. If so, I will consider nerve conduction study at that time.   3. Other abnormal glucose  HIS A1C HAS BEEN ELEVATED IN THE PAST. I WILL CHECK AN A1C, BMET TODAY. HE WAS ENCOURAGED TO AVOID SUGARY BEVERAGES AND PROCESSED FOODS INCLUDNG BREADS, RICE AND PASTA.  - Hemoglobin A1c  4. Hypogonadism in male  Chronic. I will check testosterone levels today. I reviewed most recent CBC and LFT results.   - Testosterone,Free and Total  5. Drug therapy  - CMP14+EGFR - CBC no Diff   [name], MD    THE PATIENT IS ENCOURAGED TO PRACTICE SOCIAL DISTANCING DUE TO THE COVID-19 PANDEMIC.

## 2019-07-20 ENCOUNTER — Encounter: Payer: Self-pay | Admitting: Internal Medicine

## 2019-07-21 ENCOUNTER — Other Ambulatory Visit: Payer: Self-pay

## 2019-07-21 MED ORDER — LANCETS 33G MISC: 2 refills | Status: AC

## 2019-07-21 MED ORDER — GLUCOSE BLOOD VI STRP: ORAL_STRIP | 2 refills | Status: AC

## 2019-07-21 MED ORDER — ONETOUCH DELICA LANCETS 33G MISC: 2 refills | Status: DC

## 2019-07-21 MED ORDER — BLOOD GLUCOSE MONITORING SUPPL KIT: PACK | 1 refills | Status: AC

## 2019-07-21 MED ORDER — ONETOUCH VERIO VI STRP: ORAL_STRIP | 2 refills | Status: DC

## 2019-07-21 MED ORDER — ONETOUCH VERIO W/DEVICE KIT: PACK | 1 refills | Status: DC

## 2019-08-29 ENCOUNTER — Other Ambulatory Visit: Payer: Self-pay | Admitting: Internal Medicine

## 2019-09-13 ENCOUNTER — Other Ambulatory Visit: Payer: Self-pay

## 2019-09-13 MED ORDER — EPINEPHRINE 0.3 MG/0.3ML IJ SOAJ: 0.3000 mg | INTRAMUSCULAR | 2 refills | Status: DC | PRN

## 2019-09-14 ENCOUNTER — Encounter: Payer: Self-pay | Admitting: Internal Medicine

## 2019-09-14 ENCOUNTER — Ambulatory Visit (INDEPENDENT_AMBULATORY_CARE_PROVIDER_SITE_OTHER): Payer: POS | Admitting: Internal Medicine

## 2019-09-14 ENCOUNTER — Other Ambulatory Visit: Payer: Self-pay

## 2019-09-14 VITALS — BP 124/82 | HR 64 | Temp 98.0°F | Ht 69.4 in | Wt 207.4 lb

## 2019-09-14 DIAGNOSIS — K644 Residual hemorrhoidal skin tags: Secondary | ICD-10-CM

## 2019-09-14 DIAGNOSIS — K921 Melena: Secondary | ICD-10-CM | POA: Diagnosis not present

## 2019-09-14 DIAGNOSIS — M79642 Pain in left hand: Secondary | ICD-10-CM

## 2019-09-14 DIAGNOSIS — M79641 Pain in right hand: Secondary | ICD-10-CM | POA: Diagnosis not present

## 2019-09-14 DIAGNOSIS — K5909 Other constipation: Secondary | ICD-10-CM

## 2019-09-14 LAB — CBC
Hematocrit: 45.7 % (ref 37.5–51.0)
Hemoglobin: 14.5 g/dL (ref 13.0–17.7)
MCH: 23.4 pg — ABNORMAL LOW (ref 26.6–33.0)
MCHC: 31.7 g/dL (ref 31.5–35.7)
MCV: 74 fL — ABNORMAL LOW (ref 79–97)
Platelets: 128 10*3/uL — ABNORMAL LOW (ref 150–450)
RBC: 6.19 x10E6/uL — ABNORMAL HIGH (ref 4.14–5.80)
RDW: 19.4 % — ABNORMAL HIGH (ref 11.6–15.4)
WBC: 3 10*3/uL — ABNORMAL LOW (ref 3.4–10.8)

## 2019-09-14 MED ORDER — HYDROCORTISONE ACETATE 25 MG RE SUPP
25.0000 mg | Freq: Two times a day (BID) | RECTAL | 1 refills | Status: AC
Start: 2019-09-14 — End: 2019-09-28

## 2019-09-14 NOTE — Progress Notes (Signed)
This visit occurred during the SARS-CoV-2 public health emergency.  Safety protocols were in place, including screening questions prior to the visit, additional usage of staff PPE, and extensive cleaning of exam room while observing appropriate contact time as indicated for disinfecting solutions.  Subjective:     Patient ID: Andrew Fisher , male    DOB: [birth year] , 64 y.o.   MRN: [id number]   Chief Complaint  Patient presents with  . Blood In Stools    HPI  He presents today for further evaluation of blood in stools. Occurred twice over the past week. Most recently occurred this past Sunday. He does admit to being constipated. He denies h/o hemorrhoids.  Blood was on tissue and in toilet. He reports toilet was full of blood. He also admits to being more gassy than usual. He has not tried any OTC meds for relief. No associated abdominal pain, but does occasionally feel bloated.  He denies pain during defecation.     Past Medical History:  Diagnosis Date  . Arthritis   . Asthma   . GERD (gastroesophageal reflux disease)   . Hearing loss   . Hemorrhoids   . Hyperlipidemia   . Low testosterone in male   . Peripheral neuropathy   . Sleep apnea    wears cpap  . Sore throat   . Wears glasses   . Wears hearing aid in both ears      Family History  Problem Relation Age of Onset  . Diabetes Mother   . Hypertension Mother   . Healthy Father   . Cancer Other      Current Outpatient Medications:  .  albuterol (PROAIR HFA) 108 (90 Base) MCG/ACT inhaler, Inhale 1-2 puffs into the lungs every 6 (six) hours as needed for wheezing or shortness of breath., Disp: 18 g, Rfl: 1 .  aspirin EC 81 MG tablet, Take 81 mg by mouth daily., Disp: , Rfl:  .  atorvastatin (LIPITOR) 10 MG tablet, TAKE 1 TABLET DAILY, Disp: 90 tablet, Rfl: 3 .  BELBUCA 300 MCG FILM, 2 times per day, Disp: , Rfl:  .  Blood Glucose Monitoring Suppl KIT, Use as directed to check blood sugars 1 time per day  dx:e11.22, Disp: 1 kit, Rfl: 1 .  Cholecalciferol (VITAMIN D3 PO), Take 2,000 Units by mouth. , Disp: , Rfl:  .  EPINEPHrine 0.3 mg/0.3 mL IJ SOAJ injection, Inject 0.3 mLs (0.3 mg total) into the muscle as needed for anaphylaxis., Disp: 1 each, Rfl: 2 .  fluticasone (FLONASE) 50 MCG/ACT nasal spray, , Disp: , Rfl:  .  gabapentin (NEURONTIN) 300 MG capsule, Take 300 mg by mouth 4 (four) times daily., Disp: , Rfl:  .  glucose blood test strip, Use as directed to check blood sugars 1 time per day dx:e11.22, Disp: 150 each, Rfl: 2 .  Lancets 33G MISC, Use as directed to check blood sugars 1 time per day dx:e11.22, Disp: 150 each, Rfl: 2 .  lansoprazole (PREVACID) 30 MG capsule, Take 30 mg by mouth daily at 12 noon., Disp: , Rfl:  .  LINZESS 145 MCG CAPS capsule, , Disp: , Rfl:  .  loratadine (CLARITIN) 10 MG tablet, Take 10 mg by mouth daily., Disp: , Rfl:  .  magnesium oxide (MAG-OX) 400 MG tablet, Take 400 mg by mouth daily., Disp: , Rfl:  .  montelukast (SINGULAIR) 10 MG tablet, TAKE 1 TABLET DAILY, Disp: 90 tablet, Rfl: 1 .  Multiple Vitamins-Minerals (MULTIVITAMIN PO), Take  1 tablet by mouth daily., Disp: , Rfl:  .  tadalafil (CIALIS) 5 MG tablet, Take by mouth., Disp: , Rfl:  .  tamsulosin (FLOMAX) 0.4 MG CAPS capsule, Take 0.4 mg by mouth daily. , Disp: , Rfl:  .  urea (CARMOL) 20 % cream, Apply topically as needed., Disp: , Rfl:    No Known Allergies   Review of Systems  Constitutional: Negative.   Respiratory: Negative.   Cardiovascular: Negative.   Gastrointestinal: Positive for abdominal distention, blood in stool and constipation.  Neurological: Positive for numbness.       He c/o b/l hand numbness and pain. Minimal relief with b/l wrist splints. He is ready for further evaluation.  Psychiatric/Behavioral: Negative.      Today's Vitals   09/14/19 0834  BP: 124/82  Pulse: 64  Temp: 98 F (36.7 C)  TempSrc: Oral  Weight: 207 lb 6.4 oz (94.1 kg)  Height: 5' 9.4" (1.763 m)   PainSc: 0-No pain   Body mass index is 30.28 kg/m.   Objective:  Physical Exam Vitals and nursing note reviewed.  Constitutional:      Appearance: Normal appearance.  Cardiovascular:     Rate and Rhythm: Normal rate and regular rhythm.     Heart sounds: Normal heart sounds.  Pulmonary:     Effort: Pulmonary effort is normal.     Breath sounds: Normal breath sounds.  Genitourinary:    Rectum: Guaiac result negative. External hemorrhoid present. No mass or tenderness.  Musculoskeletal:     Comments: Positive Phalen's sign. Neg tinels sign.   Skin:    General: Skin is warm.  Neurological:     General: No focal deficit present.     Mental Status: He is alert.  Psychiatric:        Mood and Affect: Mood normal.         Assessment And Plan:     1. Blood in stool  I will check  CBC today. DRE performed, stool is heme negative.   - CBC no Diff  2. Chronic constipation  Chronic. He was given samples of Linzess, 88m to take once daily. Advised to take upon awakening, and wait 30 minutes before eating/drinking. He agrees RTO in 4 weeks for re-evaluation.   3. External hemorrhoid  He was given rx Anusol suppository to use prn. He is encouraged to let me know if his sx recur.   4. Bilateral hand pain  He agrees to Neuro referral for NCV testing.   - Ambulatory referral to Neurology  RMaximino Greenland MD    THE PATIENT IS ENCOURAGED TO PRACTICE SOCIAL DISTANCING DUE TO THE COVID-19 PANDEMIC.

## 2019-09-14 NOTE — Patient Instructions (Signed)
Hemorrhoids Hemorrhoids are swollen veins that may develop:  In the butt (rectum). These are called internal hemorrhoids.  Around the opening of the butt (anus). These are called external hemorrhoids. Hemorrhoids can cause pain, itching, or bleeding. Most of the time, they do not cause serious problems. They usually get better with diet changes, lifestyle changes, and other home treatments. What are the causes? This condition may be caused by:  Having trouble pooping (constipation).  Pushing hard (straining) to poop.  Watery poop (diarrhea).  Pregnancy.  Being very overweight (obese).  Sitting for long periods of time.  Heavy lifting or other activity that causes you to strain.  Anal sex.  Riding a bike for a long period of time. What are the signs or symptoms? Symptoms of this condition include:  Pain.  Itching or soreness in the butt.  Bleeding from the butt.  Leaking poop.  Swelling in the area.  One or more lumps around the opening of your butt. How is this diagnosed? A doctor can often diagnose this condition by looking at the affected area. The doctor may also:  Do an exam that involves feeling the area with a gloved hand (digital rectal exam).  Examine the area inside your butt using a small tube (anoscope).  Order blood tests. This may be done if you have lost a lot of blood.  Have you get a test that involves looking inside the colon using a flexible tube with a camera on the end (sigmoidoscopy or colonoscopy). How is this treated? This condition can usually be treated at home. Your doctor may tell you to change what you eat, make lifestyle changes, or try home treatments. If these do not help, procedures can be done to remove the hemorrhoids or make them smaller. These may involve:  Placing rubber bands at the base of the hemorrhoids to cut off their blood supply.  Injecting medicine into the hemorrhoids to shrink them.  Shining a type of light  energy onto the hemorrhoids to cause them to fall off.  Doing surgery to remove the hemorrhoids or cut off their blood supply. Follow these instructions at home: Eating and drinking   Eat foods that have a lot of fiber in them. These include whole grains, beans, nuts, fruits, and vegetables.  Ask your doctor about taking products that have added fiber (fibersupplements).  Reduce the amount of fat in your diet. You can do this by: ? Eating low-fat dairy products. ? Eating less red meat. ? Avoiding processed foods.  Drink enough fluid to keep your pee (urine) pale yellow. Managing pain and swelling   Take a warm-water bath (sitz bath) for 20 minutes to ease pain. Do this 3-4 times a day. You may do this in a bathtub or using a portable sitz bath that fits over the toilet.  If told, put ice on the painful area. It may be helpful to use ice between your warm baths. ? Put ice in a plastic bag. ? Place a towel between your skin and the bag. ? Leave the ice on for 20 minutes, 2-3 times a day. General instructions  Take over-the-counter and prescription medicines only as told by your doctor. ? Medicated creams and medicines may be used as told.  Exercise often. Ask your doctor how much and what kind of exercise is best for you.  Go to the bathroom when you have the urge to poop. Do not wait.  Avoid pushing too hard when you poop.  Keep your   butt dry and clean. Use wet toilet paper or moist towelettes after pooping.  Do not sit on the toilet for a long time.  Keep all follow-up visits as told by your doctor. This is important. Contact a doctor if you:  Have pain and swelling that do not get better with treatment or medicine.  Have trouble pooping.  Cannot poop.  Have pain or swelling outside the area of the hemorrhoids. Get help right away if you have:  Bleeding that will not stop. Summary  Hemorrhoids are swollen veins in the butt or around the opening of the  butt.  They can cause pain, itching, or bleeding.  Eat foods that have a lot of fiber in them. These include whole grains, beans, nuts, fruits, and vegetables.  Take a warm-water bath (sitz bath) for 20 minutes to ease pain. Do this 3-4 times a day. This information is not intended to replace advice given to you by your health care provider. Make sure you discuss any questions you have with your health care provider. Document Revised: 03/19/2018 Document Reviewed: 07/31/2017 Elsevier Patient Education  2020 [employer]  

## 2019-09-21 LAB — POC HEMOCCULT BLD/STL (OFFICE/1-CARD/DIAGNOSTIC)
Card #1 Date: 6232021
Fecal Occult Blood, POC: NEGATIVE

## 2019-09-22 ENCOUNTER — Encounter: Payer: Self-pay | Admitting: Internal Medicine

## 2019-09-22 ENCOUNTER — Telehealth: Payer: Self-pay

## 2019-09-22 NOTE — Telephone Encounter (Signed)
I called the patient because Dr. Allyne Gee wanted to know how the patient is doing, if the Linzess is helping him.

## 2019-09-23 ENCOUNTER — Encounter: Payer: Self-pay | Admitting: Internal Medicine

## 2019-09-23 ENCOUNTER — Ambulatory Visit (INDEPENDENT_AMBULATORY_CARE_PROVIDER_SITE_OTHER): Payer: Medicare Other | Admitting: Internal Medicine

## 2019-09-23 ENCOUNTER — Other Ambulatory Visit: Payer: Self-pay

## 2019-09-23 VITALS — BP 132/80 | HR 81 | Temp 98.1°F | Ht 69.4 in | Wt 206.0 lb

## 2019-09-23 DIAGNOSIS — R05 Cough: Secondary | ICD-10-CM

## 2019-09-23 DIAGNOSIS — R059 Cough, unspecified: Secondary | ICD-10-CM

## 2019-09-23 DIAGNOSIS — R0982 Postnasal drip: Secondary | ICD-10-CM | POA: Diagnosis not present

## 2019-09-23 NOTE — Patient Instructions (Addendum)
Hold loratadine  Start Ryclora, one tsp at night  Email Dr. Allyne Gee to let me know how this works for you.   IStart Cetirizine 10mg  nightly (Zyrtec) ONLY IF RYCLORA does not work  Call office on 7/6 - if symptoms persist, will need to refer to ENT

## 2019-09-26 NOTE — Progress Notes (Signed)
This visit occurred during the SARS-CoV-2 public health emergency.  Safety protocols were in place, including screening questions prior to the visit, additional usage of staff PPE, and extensive cleaning of exam room while observing appropriate contact time as indicated for disinfecting solutions.  Subjective:     Patient ID: Andrew Fisher , male    DOB: 19-Jun-1955 , 64 y.o.   MRN: [id number]   Chief Complaint  Patient presents with  . something in throat    HPI  He presents today for further evaluation of "something in his throat".  He reports that he has had a cough for the past week. Denies fever, chills.  He is unable to sleep at night due to the persistent cough. He has not tried any otc cough preparations for relief. He has been taking loratadine for his allergies. He does not find this to be helpful. Cough is non-productive. No associated sinus congestion, ear pain and sore throat.     Past Medical History:  Diagnosis Date  . Arthritis   . Asthma   . GERD (gastroesophageal reflux disease)   . Hearing loss   . Hemorrhoids   . Hyperlipidemia   . Low testosterone in male   . Peripheral neuropathy   . Sleep apnea    wears cpap  . Sore throat   . Wears glasses   . Wears hearing aid in both ears      Family History  Problem Relation Age of Onset  . Diabetes Mother   . Hypertension Mother   . Healthy Father   . Cancer Other      Current Outpatient Medications:  .  albuterol (PROAIR HFA) 108 (90 Base) MCG/ACT inhaler, Inhale 1-2 puffs into the lungs every 6 (six) hours as needed for wheezing or shortness of breath., Disp: 18 g, Rfl: 1 .  aspirin EC 81 MG tablet, Take 81 mg by mouth daily., Disp: , Rfl:  .  atorvastatin (LIPITOR) 10 MG tablet, TAKE 1 TABLET DAILY, Disp: 90 tablet, Rfl: 3 .  BELBUCA 300 MCG FILM, 2 times per day, Disp: , Rfl:  .  Blood Glucose Monitoring Suppl KIT, Use as directed to check blood sugars 1 time per day dx:e11.22, Disp: 1 kit, Rfl: 1 .   Cholecalciferol (VITAMIN D3 PO), Take 2,000 Units by mouth. , Disp: , Rfl:  .  EPINEPHrine 0.3 mg/0.3 mL IJ SOAJ injection, Inject 0.3 mLs (0.3 mg total) into the muscle as needed for anaphylaxis., Disp: 1 each, Rfl: 2 .  fluticasone (FLONASE) 50 MCG/ACT nasal spray, , Disp: , Rfl:  .  gabapentin (NEURONTIN) 300 MG capsule, Take 300 mg by mouth 4 (four) times daily., Disp: , Rfl:  .  glucose blood test strip, Use as directed to check blood sugars 1 time per day dx:e11.22, Disp: 150 each, Rfl: 2 .  hydrocortisone (ANUSOL-HC) 25 MG suppository, Place 1 suppository (25 mg total) rectally every 12 (twelve) hours for 14 days., Disp: 12 suppository, Rfl: 1 .  Lancets 33G MISC, Use as directed to check blood sugars 1 time per day dx:e11.22, Disp: 150 each, Rfl: 2 .  lansoprazole (PREVACID) 30 MG capsule, Take 30 mg by mouth daily at 12 noon., Disp: , Rfl:  .  LINZESS 145 MCG CAPS capsule, , Disp: , Rfl:  .  loratadine (CLARITIN) 10 MG tablet, Take 10 mg by mouth daily., Disp: , Rfl:  .  magnesium oxide (MAG-OX) 400 MG tablet, Take 400 mg by mouth daily., Disp: , Rfl:  .  montelukast (SINGULAIR) 10 MG tablet, TAKE 1 TABLET DAILY, Disp: 90 tablet, Rfl: 1 .  Multiple Vitamins-Minerals (MULTIVITAMIN PO), Take 1 tablet by mouth daily., Disp: , Rfl:  .  tadalafil (CIALIS) 5 MG tablet, Take by mouth., Disp: , Rfl:  .  tamsulosin (FLOMAX) 0.4 MG CAPS capsule, Take 0.4 mg by mouth daily. , Disp: , Rfl:  .  urea (CARMOL) 20 % cream, Apply topically as needed., Disp: , Rfl:    No Known Allergies   Review of Systems  Constitutional: Negative.  Negative for fatigue and fever.  Respiratory: Positive for cough.   Cardiovascular: Negative.   Gastrointestinal: Negative.   Neurological: Negative.   Psychiatric/Behavioral: Negative.      Today's Vitals   09/23/19 1437  BP: 132/80  Pulse: 81  Temp: 98.1 F (36.7 C)  TempSrc: Oral  Weight: 206 lb (93.4 kg)  Height: 5' 9.4" (1.763 m)  PainSc: 0-No pain    Body mass index is 30.07 kg/m.   Objective:  Physical Exam Vitals and nursing note reviewed.  Constitutional:      Appearance: Normal appearance.  HENT:     Right Ear: Tympanic membrane, ear canal and external ear normal. There is no impacted cerumen.     Left Ear: Tympanic membrane, ear canal and external ear normal. There is no impacted cerumen.     Mouth/Throat:     Mouth: Mucous membranes are moist.     Pharynx: No oropharyngeal exudate or posterior oropharyngeal erythema.  Cardiovascular:     Rate and Rhythm: Normal rate and regular rhythm.     Heart sounds: Normal heart sounds.  Pulmonary:     Effort: Pulmonary effort is normal.     Breath sounds: Normal breath sounds.  Skin:    General: Skin is warm.  Neurological:     General: No focal deficit present.     Mental Status: He is alert.  Psychiatric:        Mood and Affect: Mood normal.         Assessment And Plan:     1. Cough  His cough is likely due to postnasal drip. He declines rx cough syrup.   2. Postnasal drip  He was given samples of Ryclora, 1 tsp to use nightly as needed.  If no improvement in his sx, he will try Zyrtec, 24m nightly. If his sx are persistent, will consider ENT evaluation. Pt given written instructions and verbally acknowledges his treatment plan. All questions were answered to his satisfaction .  RMaximino Greenland MD    THE PATIENT IS ENCOURAGED TO PRACTICE SOCIAL DISTANCING DUE TO THE COVID-19 PANDEMIC.

## 2019-09-30 ENCOUNTER — Other Ambulatory Visit: Payer: Self-pay | Admitting: Internal Medicine

## 2019-10-28 ENCOUNTER — Other Ambulatory Visit: Payer: Self-pay | Admitting: Gastroenterology

## 2019-11-07 ENCOUNTER — Emergency Department (HOSPITAL_COMMUNITY)
Admission: EM | Admit: 2019-11-07 | Discharge: 2019-11-07 | Disposition: A | Discharge status: Home / Self Care | Payer: Medicare Other | Attending: Emergency Medicine | Admitting: Emergency Medicine

## 2019-11-07 ENCOUNTER — Encounter (HOSPITAL_COMMUNITY): Payer: Self-pay

## 2019-11-07 ENCOUNTER — Other Ambulatory Visit: Payer: Self-pay

## 2019-11-07 DIAGNOSIS — J45909 Unspecified asthma, uncomplicated: Secondary | ICD-10-CM | POA: Diagnosis not present

## 2019-11-07 DIAGNOSIS — H60502 Unspecified acute noninfective otitis externa, left ear: Secondary | ICD-10-CM

## 2019-11-07 DIAGNOSIS — Z87891 Personal history of nicotine dependence: Secondary | ICD-10-CM | POA: Insufficient documentation

## 2019-11-07 DIAGNOSIS — H9202 Otalgia, left ear: Secondary | ICD-10-CM | POA: Diagnosis present

## 2019-11-07 DIAGNOSIS — Z7982 Long term (current) use of aspirin: Secondary | ICD-10-CM | POA: Diagnosis not present

## 2019-11-07 MED ORDER — NEOMYCIN-POLYMYXIN-HC 3.5-10000-1 OT SUSP
4.0000 [drp] | Freq: Four times a day (QID) | OTIC | 0 refills | Status: DC
Start: 2019-11-07 — End: 2023-09-23

## 2019-11-07 MED ORDER — CIPROFLOXACIN HCL 500 MG PO TABS
500.0000 mg | ORAL_TABLET | Freq: Two times a day (BID) | ORAL | 0 refills | Status: DC
Start: 2019-11-07 — End: 2019-11-16

## 2019-11-07 NOTE — Discharge Instructions (Signed)
Begin using Cortisporin drops and Cipro as prescribed.  Follow-up with the ENT if symptoms or not improving in the next few days.  The contact information for [facility] has been provided in this discharge summary for you to call and make these arrangements.  Do not wear your hearing aid until symptoms are resolved.

## 2019-11-07 NOTE — Medical Student Note (Signed)
[place] DEPT Provider Student Note For educational purposes for Medical, PA and NP students only and not part of the legal medical record.   CSN: [id number] Arrival date & time: 11/07/19  9323      History   Chief Complaint Chief Complaint  Patient presents with   Otalgia    HPI Andrew Fisher is a 64 y.o. male. Left ear soreness starting on Friday that has gotten progressively worse. Clear discharge started this morning. Headaches and pain a  HPI  Past Medical History:  Diagnosis Date   Arthritis    Asthma    GERD (gastroesophageal reflux disease)    Hearing loss    Hemorrhoids    Hyperlipidemia    Low testosterone in male    Peripheral neuropathy    Sleep apnea    wears cpap   Sore throat    Wears glasses    Wears hearing aid in both ears     Patient Active Problem List   Diagnosis Date Noted   Visit for TB skin test 05/04/2018   Pure hypercholesterolemia 03/26/2018   Family history of colon cancer 03/26/2018   Cough 03/26/2018   Allergic rhinitis due to allergen 03/26/2018   Hematoma of neck 01/11/2017   S/P cervical spinal fusion 01/11/2017   Radiculopathy 01/09/2017   Hemorrhoids, internal 12/11/2010    Past Surgical History:  Procedure Laterality Date   ANTERIOR CERVICAL DECOMP/DISCECTOMY FUSION N/A 01/09/2017   Procedure: ANTERIOR CERVICAL DECOMPRESSION FUSION, CERVICAL FOUR-FIVE, CERVICAL FIVE-SIX INSTRUMENTATION AND ALLOGRAFT;  Surgeon: Phylliss Bob, MD;  Location: [place];  Service: Orthopedics;  Laterality: N/A;  ANTERIOR CERVICAL DECOMPRESSION FUSION, CERVICAL FOUR-FIVE, CERVICAL FIVE-SIX INSTRUMENTATION AND ALLOGRAFT   APPENDECTOMY     COLONOSCOPY     NECK SURGERY     SHOULDER SURGERY  2005   rotator cuff and tendons repaired       Home Medications    Prior to Admission medications   Medication Sig Start Date End Date Taking? Authorizing Provider  albuterol (VENTOLIN HFA) 108 (90 Base) MCG/ACT  inhaler USE 1 TO 2 INHALATIONS EVERY 6 HOURS AS NEEDED FOR WHEEZING OR SHORTNESS OF BREATH Patient taking differently: Inhale 2 puffs into the lungs every 6 (six) hours as needed for wheezing or shortness of breath.  09/30/19   Glendale Chard, MD  aspirin EC 81 MG tablet Take 81 mg by mouth daily.    [provider]  atorvastatin (LIPITOR) 10 MG tablet TAKE 1 TABLET DAILY Patient taking differently: Take 10 mg by mouth daily.  05/17/19   Glendale Chard, MD  BELBUCA 300 MCG FILM Take 300 mcg by mouth every 12 (twelve) hours.  11/06/18   [provider]  Blood Glucose Monitoring Suppl KIT Use as directed to check blood sugars 1 time per day dx:e11.22 07/21/19   Glendale Chard, MD  EPINEPHrine 0.3 mg/0.3 mL IJ SOAJ injection Inject 0.3 mLs (0.3 mg total) into the muscle as needed for anaphylaxis. Patient taking differently: Inject 0.3 mg into the muscle daily as needed for anaphylaxis.  09/13/19   Glendale Chard, MD  gabapentin (NEURONTIN) 300 MG capsule Take 300 mg by mouth 4 (four) times daily.    [provider]  glucose blood test strip Use as directed to check blood sugars 1 time per day dx:e11.22 07/21/19   Glendale Chard, MD  Lancets 33G MISC Use as directed to check blood sugars 1 time per day dx:e11.22 07/21/19   Glendale Chard, MD  lansoprazole (PREVACID) 30 MG  capsule Take 30 mg by mouth daily.     [provider]  LINZESS 145 MCG CAPS capsule Take 145 mcg by mouth daily as needed (constipation).  08/28/18   [provider]  loratadine (CLARITIN) 10 MG tablet Take 10 mg by mouth daily.    [provider]  magnesium oxide (MAG-OX) 400 MG tablet Take 400 mg by mouth daily.    [provider]  montelukast (SINGULAIR) 10 MG tablet TAKE 1 TABLET DAILY Patient taking differently: Take 10 mg by mouth daily.  08/30/19   Glendale Chard, MD  Multiple Vitamins-Minerals (MULTIVITAMIN PO) Take 1 tablet by mouth daily.    [provider]   tadalafil (CIALIS) 5 MG tablet Take 5 mg by mouth daily.  09/30/18   [provider]  tamsulosin (FLOMAX) 0.4 MG CAPS capsule Take 0.4 mg by mouth at bedtime.     [provider]  Testosterone 30 MG/ACT SOLN Apply 30 mg topically daily. Apply under each arm 10/18/19   [provider]  urea (CARMOL) 20 % cream Apply 1 application topically daily as needed (Feet pain).     [provider]    Family History Family History  Problem Relation Age of Onset   Diabetes Mother    Hypertension Mother    Healthy Father    Cancer Other     Social History Social History   Tobacco Use   Smoking status: Former Smoker    Packs/day: 0.25    Years: 6.00    Pack years: 1.50    Types: Cigarettes   Smokeless tobacco: Never Used  Scientific laboratory technician Use: Never used  Substance Use Topics   Alcohol use: Yes    Comment: 5 times a week   Drug use: No     Allergies   Patient has no known allergies.   Review of Systems Review of Systems   Physical Exam Updated Vital Signs BP (!) 145/83 (BP Location: Right Arm)    Pulse 82    Temp 98.1 F (36.7 C) (Oral)    Resp 18    Ht 5' 8"  (1.727 m)    Wt 97.1 kg    SpO2 97%    BMI 32.54 kg/m   Physical Exam   ED Treatments / Results  Labs (all labs ordered are listed, but only abnormal results are displayed) Labs Reviewed - No data to display  EKG  Radiology No results found.  Procedures Procedures (including critical care time)  Medications Ordered in ED Medications - No data to display   Initial Impression / Assessment and Plan / ED Course  I have reviewed the triage vital signs and the nursing notes.  Pertinent labs & imaging results that were available during my care of the patient were reviewed by me and considered in my medical decision making (see chart for details).    Final Clinical Impressions(s) / ED Diagnoses   Final diagnoses:  None    New Prescriptions New Prescriptions    No medications on file

## 2019-11-07 NOTE — ED Provider Notes (Signed)
[facility] EMERGENCY DEPARTMENT Provider Note   CSN: [id number] Arrival date & time: 11/07/19  6440     History Chief Complaint  Patient presents with  . Otalgia    Andrew Fisher is a 64 y.o. male.  Patient is a 64 year old male with past medical history of asthma, GERD, arthritis.  He presents today for evaluation of left ear pain.  This began several days ago and is worsening.  Patient wears hearing aids and is now having difficulty placing his earpiece due to swelling and pain.  He has a clear to white discharge from the ear.  His hearing is intact.  The history is provided by the patient.  Otalgia Location:  Left Behind ear:  No abnormality Quality:  Aching Severity:  Moderate Onset quality:  Gradual Duration:  3 days Timing:  Constant Progression:  Worsening Chronicity:  New Relieved by:  Nothing Worsened by:  Nothing      Past Medical History:  Diagnosis Date  . Arthritis   . Asthma   . GERD (gastroesophageal reflux disease)   . Hearing loss   . Hemorrhoids   . Hyperlipidemia   . Low testosterone in male   . Peripheral neuropathy   . Sleep apnea    wears cpap  . Sore throat   . Wears glasses   . Wears hearing aid in both ears     Patient Active Problem List   Diagnosis Date Noted  . Visit for TB skin test 05/04/2018  . Pure hypercholesterolemia 03/26/2018  . Family history of colon cancer 03/26/2018  . Cough 03/26/2018  . Allergic rhinitis due to allergen 03/26/2018  . Hematoma of neck 01/11/2017  . S/P cervical spinal fusion 01/11/2017  . Radiculopathy 01/09/2017  . Hemorrhoids, internal 12/11/2010    Past Surgical History:  Procedure Laterality Date  . ANTERIOR CERVICAL DECOMP/DISCECTOMY FUSION N/A 01/09/2017   Procedure: ANTERIOR CERVICAL DECOMPRESSION FUSION, CERVICAL FOUR-FIVE, CERVICAL FIVE-SIX INSTRUMENTATION AND ALLOGRAFT;  Surgeon: Phylliss Bob, MD;  Location: [place];  Service: Orthopedics;  Laterality: N/A;  ANTERIOR CERVICAL  DECOMPRESSION FUSION, CERVICAL FOUR-FIVE, CERVICAL FIVE-SIX INSTRUMENTATION AND ALLOGRAFT  . APPENDECTOMY    . COLONOSCOPY    . NECK SURGERY    . SHOULDER SURGERY  2005   rotator cuff and tendons repaired       Family History  Problem Relation Age of Onset  . Diabetes Mother   . Hypertension Mother   . Healthy Father   . Cancer Other     Social History   Tobacco Use  . Smoking status: Former Smoker    Packs/day: 0.25    Years: 6.00    Pack years: 1.50    Types: Cigarettes  . Smokeless tobacco: Never Used  Vaping Use  . Vaping Use: Never used  Substance Use Topics  . Alcohol use: Yes    Comment: 5 times a week  . Drug use: No    Home Medications Prior to Admission medications   Medication Sig Start Date End Date Taking? Authorizing Provider  albuterol (VENTOLIN HFA) 108 (90 Base) MCG/ACT inhaler USE 1 TO 2 INHALATIONS EVERY 6 HOURS AS NEEDED FOR WHEEZING OR SHORTNESS OF BREATH Patient taking differently: Inhale 2 puffs into the lungs every 6 (six) hours as needed for wheezing or shortness of breath.  09/30/19   Glendale Chard, MD  aspirin EC 81 MG tablet Take 81 mg by mouth daily.    [provider]  atorvastatin (LIPITOR) 10 MG tablet TAKE 1 TABLET  DAILY Patient taking differently: Take 10 mg by mouth daily.  05/17/19   Glendale Chard, MD  BELBUCA 300 MCG FILM Take 300 mcg by mouth every 12 (twelve) hours.  11/06/18   [provider]  Blood Glucose Monitoring Suppl KIT Use as directed to check blood sugars 1 time per day dx:e11.22 07/21/19   Glendale Chard, MD  EPINEPHrine 0.3 mg/0.3 mL IJ SOAJ injection Inject 0.3 mLs (0.3 mg total) into the muscle as needed for anaphylaxis. Patient taking differently: Inject 0.3 mg into the muscle daily as needed for anaphylaxis.  09/13/19   Glendale Chard, MD  gabapentin (NEURONTIN) 300 MG capsule Take 300 mg by mouth 4 (four) times daily.    [provider]  glucose blood test strip Use as directed to check  blood sugars 1 time per day dx:e11.22 07/21/19   Glendale Chard, MD  Lancets 33G MISC Use as directed to check blood sugars 1 time per day dx:e11.22 07/21/19   Glendale Chard, MD  lansoprazole (PREVACID) 30 MG capsule Take 30 mg by mouth daily.     [provider]  LINZESS 145 MCG CAPS capsule Take 145 mcg by mouth daily as needed (constipation).  08/28/18   [provider]  loratadine (CLARITIN) 10 MG tablet Take 10 mg by mouth daily.    [provider]  magnesium oxide (MAG-OX) 400 MG tablet Take 400 mg by mouth daily.    [provider]  montelukast (SINGULAIR) 10 MG tablet TAKE 1 TABLET DAILY Patient taking differently: Take 10 mg by mouth daily.  08/30/19   Glendale Chard, MD  Multiple Vitamins-Minerals (MULTIVITAMIN PO) Take 1 tablet by mouth daily.    [provider]  tadalafil (CIALIS) 5 MG tablet Take 5 mg by mouth daily.  09/30/18   [provider]  tamsulosin (FLOMAX) 0.4 MG CAPS capsule Take 0.4 mg by mouth at bedtime.     [provider]  Testosterone 30 MG/ACT SOLN Apply 30 mg topically daily. Apply under each arm 10/18/19   [provider]  urea (CARMOL) 20 % cream Apply 1 application topically daily as needed (Feet pain).     [provider]    Allergies    Patient has no known allergies.  Review of Systems   Review of Systems  HENT: Positive for ear pain.   All other systems reviewed and are negative.   Physical Exam Updated Vital Signs BP (!) 145/83 (BP Location: Right Arm)   Pulse 82   Temp 98.1 F (36.7 C) (Oral)   Resp 18   Ht _0  (1.727 m)   Wt 97.1 kg   SpO2 97%   BMI 32.54 kg/m   Physical Exam Vitals and nursing note reviewed.  Constitutional:      General: He is not in acute distress.    Appearance: Normal appearance. He is not ill-appearing.  HENT:     Head: Normocephalic and atraumatic.     Left Ear: Tympanic membrane normal.     Ears:     Comments: The left ear has  swelling of the ear canal.  There is inflammation and discharge noted within the ear canal.  He has pain with manipulation of the pinna and insertion of the speculum.  The TM is poorly visualized, but there is no obvious redness or fluid behind the eardrum. Pulmonary:     Effort: Pulmonary effort is normal.  Musculoskeletal:        General: Normal range of motion.  Skin:    General: Skin is warm and dry.  Neurological:     Mental Status: He is alert.     ED Results / Procedures / Treatments   Labs (all labs ordered are listed, but only abnormal results are displayed) Labs Reviewed - No data to display  EKG None  Radiology No results found.  Procedures Procedures (including critical care time)  Medications Ordered in ED Medications - No data to display  ED Course  I have reviewed the triage vital signs and the nursing notes.  Pertinent labs & imaging results that were available during my care of the patient were reviewed by me and considered in my medical decision making (see chart for details).    MDM Rules/Calculators/A&P    This appears to be an otitis externa in a nondiabetic individual who wears hearing aids.  This will be treated with Cortisporin drops and Cipro.  He is to follow-up with ENT if not improving in the next 2 to 3 days.  Final Clinical Impression(s) / ED Diagnoses Final diagnoses:  None    Rx / DC Orders ED Discharge Orders    None       Veryl Speak, MD 11/07/19 [phone]

## 2019-11-07 NOTE — ED Triage Notes (Signed)
Pt reports left earache for past few days.  Denies fever, cough, or sore throat.  Pt says is unable to wear his hearing aid.

## 2019-11-08 ENCOUNTER — Other Ambulatory Visit: Payer: Self-pay

## 2019-11-08 DIAGNOSIS — G5603 Carpal tunnel syndrome, bilateral upper limbs: Secondary | ICD-10-CM

## 2019-11-09 ENCOUNTER — Other Ambulatory Visit (HOSPITAL_COMMUNITY)
Admission: RE | Admit: 2019-11-09 | Discharge: 2019-11-09 | Disposition: A | Discharge status: Home / Self Care | Payer: Medicare Other | Source: Ambulatory Visit | Attending: Gastroenterology | Admitting: Gastroenterology

## 2019-11-09 ENCOUNTER — Ambulatory Visit (INDEPENDENT_AMBULATORY_CARE_PROVIDER_SITE_OTHER): Payer: Medicare Other | Admitting: Neurology

## 2019-11-09 ENCOUNTER — Encounter (INDEPENDENT_AMBULATORY_CARE_PROVIDER_SITE_OTHER): Payer: Medicare Other | Admitting: Neurology

## 2019-11-09 DIAGNOSIS — M5412 Radiculopathy, cervical region: Secondary | ICD-10-CM

## 2019-11-09 DIAGNOSIS — Z20822 Contact with and (suspected) exposure to covid-19: Secondary | ICD-10-CM | POA: Diagnosis not present

## 2019-11-09 DIAGNOSIS — Z01812 Encounter for preprocedural laboratory examination: Secondary | ICD-10-CM | POA: Diagnosis present

## 2019-11-09 DIAGNOSIS — R200 Anesthesia of skin: Secondary | ICD-10-CM | POA: Diagnosis not present

## 2019-11-09 DIAGNOSIS — G5603 Carpal tunnel syndrome, bilateral upper limbs: Secondary | ICD-10-CM

## 2019-11-09 DIAGNOSIS — Z0289 Encounter for other administrative examinations: Secondary | ICD-10-CM

## 2019-11-09 LAB — SARS CORONAVIRUS 2 (TAT 6-24 HRS): SARS Coronavirus 2: NEGATIVE

## 2019-11-09 NOTE — Progress Notes (Signed)
Full Name: Andrew Fisher Gender: Male MRN #: [id number] Date of Birth: 01-24-1956    Visit Date: 11/09/2019 07:17 Age: 64 Years Examining Physician: Despina Arias, MD  Referring Physician: Dorothyann Peng, MD Height: 5 feet 8 inch     History:  Andrew Fisher is a 64 year old man with bilateral hand pain and numbness.  The first 2 digits are involved and are more than the rest of the hand.  He notes symptoms most when he is laying in bed reading.  On exam, he reported mild allodynia over the thenar eminences and palmar aspect of the first 2 fingers compared to the rest of the hand.  Strength was normal and symmetric.  Nerve conduction studies: Bilateral median and ulnar motor responses had normal distal latencies, amplitudes and conduction velocities.  The ulnar F-wave latencies were normal.  Bilateral median and ulnar sensory responses had normal peak latencies and amplitudes.  The sympathetic skin response was normal in the palm.  Electromyography: Needle EMG of selected muscles of both arms was performed.  There were a few polyphasic units in the right biceps muscle.  Otherwise, motor unit morphology and recruitment was normal in all of the muscles tested.  There was no abnormal spontaneous activity.  Impression: This was a normal NCV/EMG study.  There is no evidence of neuropathy or significant radiculopathy.  Minimal chronic radiculopathies cannot always be detected on the EMG.  Victoria Henshaw A. Epimenio Foot, MD, PhD, FAAN Certified in Neurology, Clinical Neurophysiology, Sleep Medicine, Pain Medicine and Neuroimaging Director, [facility] at [facility]  [facility] [address] [place], [place] [address] [phone]   Verbal informed consent was obtained from the patient, patient was informed of potential risk of procedure, including bruising, bleeding, hematoma formation, infection, muscle weakness, muscle pain,  numbness, among others.        MNC    Nerve / Sites Muscle Latency Ref. Amplitude Ref. Rel Amp Segments Distance Velocity Ref. Area    ms ms mV mV %  cm m/s m/s mVms  R Median - APB     Wrist APB 3.8 ?4.4 7.7 ?4.0 100 Wrist - APB 7   20.3     Upper arm APB 8.0  7.1  91.5 Upper arm - Wrist 25 59 ?49 19.3  L Median - APB     Wrist APB 3.7 ?4.4 7.0 ?4.0 100 Wrist - APB 7   19.2     Upper arm APB 7.8  5.8  82.3 Upper arm - Wrist 24 58 ?49 16.5  R Ulnar - ADM     Wrist ADM 2.6 ?3.3 11.3 ?6.0 100 Wrist - ADM 7   25.2     B.Elbow ADM 7.1  9.0  79.7 B.Elbow - Wrist 25 55 ?49 23.0     A.Elbow ADM 9.1  8.1  89.6 A.Elbow - B.Elbow 10 52 ?49 26.5  L Ulnar - ADM     Wrist ADM 2.8 ?3.3 8.6 ?6.0 100 Wrist - ADM 7   25.9     B.Elbow ADM 7.2  7.2  83.8 B.Elbow - Wrist 23 53 ?49 22.5     A.Elbow ADM 9.2  6.3  87.8 A.Elbow - B.Elbow 10 49 ?49 24.8             SSR    Nerve / Sites Latency   s  L Sympathetic - Palm     Palm 1.92  SNC    Nerve / Sites Rec. Site Peak Lat Ref.  Amp Ref. Segments Distance Peak Diff Ref.    ms ms V V  cm ms ms  R Median, Ulnar - Transcarpal comparison     Median Palm Wrist 2.1 ?2.2 60 ?35 Median Palm - Wrist 8       Ulnar Palm Wrist 1.9 ?2.2 18 ?12 Ulnar Palm - Wrist 8          Median Palm - Ulnar Palm  0.2 ?0.4  L Median, Ulnar - Transcarpal comparison     Median Palm Wrist 2.0 ?2.2 51 ?35 Median Palm - Wrist 8       Ulnar Palm Wrist 2.1 ?2.2 10 ?12 Ulnar Palm - Wrist 8          Median Palm - Ulnar Palm  -0.1 ?0.4  R Median - Orthodromic (Dig II, Mid palm)     Dig II Wrist 3.1 ?3.4 10 ?10 Dig II - Wrist 13    L Median - Orthodromic (Dig II, Mid palm)     Dig II Wrist 2.9 ?3.4 12 ?10 Dig II - Wrist 13    R Ulnar - Orthodromic, (Dig V, Mid palm)     Dig V Wrist 2.6 ?3.1 7 ?5 Dig V - Wrist 11    L Ulnar - Orthodromic, (Dig V, Mid palm)     Dig V Wrist 2.9 ?3.1 6 ?5 Dig V - Wrist 49                   F  Wave    Nerve F Lat Ref.   ms ms  R Ulnar - ADM  31.3 ?32.0  L Ulnar - ADM 31.8 ?32.0         EMG Summary Table    Spontaneous MUAP Recruitment  Muscle IA Fib PSW Fasc Other Amp Dur. Poly Pattern  R. Deltoid Normal None None None _______ Normal Normal Normal Normal  R. Triceps brachii Normal None None None _______ Normal Normal Normal Normal  R. Biceps brachii Normal None None None _______ Normal Normal Normal Normal  R. Extensor digitorum communis Normal None None None _______ Normal Normal Normal Normal  R. Flexor carpi ulnaris Normal None None None _______ Normal Normal Normal Normal  R. Abductor pollicis brevis Normal None None None _______ Normal Normal Normal Normal  R. First dorsal interosseous Normal None None None _______ Normal Normal Normal Normal  L. Deltoid Normal None None None _______ Normal Normal Normal Normal  L. Biceps brachii Normal None None None _______ Normal Normal Normal Normal  L. Triceps brachii Normal None None None _______ Normal Normal Normal Normal  L. Extensor digitorum communis Normal None None None _______ Normal Normal Normal Normal  L. First dorsal interosseous Normal None None None _______ Normal Normal Normal Normal

## 2019-11-12 ENCOUNTER — Ambulatory Visit (HOSPITAL_COMMUNITY): Payer: Medicare Other | Admitting: Certified Registered"

## 2019-11-12 ENCOUNTER — Ambulatory Visit (HOSPITAL_COMMUNITY)
Admission: RE | Admit: 2019-11-12 | Discharge: 2019-11-12 | Disposition: A | Discharge status: Home / Self Care | Payer: Medicare Other | Attending: Gastroenterology | Admitting: Gastroenterology

## 2019-11-12 ENCOUNTER — Other Ambulatory Visit: Payer: Self-pay

## 2019-11-12 ENCOUNTER — Encounter (HOSPITAL_COMMUNITY): Payer: Self-pay | Admitting: Gastroenterology

## 2019-11-12 ENCOUNTER — Encounter (HOSPITAL_COMMUNITY)
Admission: RE | Disposition: A | Discharge status: Home / Self Care | Payer: Self-pay | Source: Home / Self Care | Attending: Gastroenterology

## 2019-11-12 DIAGNOSIS — K449 Diaphragmatic hernia without obstruction or gangrene: Secondary | ICD-10-CM | POA: Insufficient documentation

## 2019-11-12 DIAGNOSIS — Z87891 Personal history of nicotine dependence: Secondary | ICD-10-CM | POA: Diagnosis not present

## 2019-11-12 DIAGNOSIS — K2100 Gastro-esophageal reflux disease with esophagitis, without bleeding: Secondary | ICD-10-CM | POA: Insufficient documentation

## 2019-11-12 DIAGNOSIS — Z981 Arthrodesis status: Secondary | ICD-10-CM | POA: Diagnosis not present

## 2019-11-12 DIAGNOSIS — M1990 Unspecified osteoarthritis, unspecified site: Secondary | ICD-10-CM | POA: Insufficient documentation

## 2019-11-12 DIAGNOSIS — R110 Nausea: Secondary | ICD-10-CM | POA: Insufficient documentation

## 2019-11-12 DIAGNOSIS — G4730 Sleep apnea, unspecified: Secondary | ICD-10-CM | POA: Insufficient documentation

## 2019-11-12 DIAGNOSIS — R6881 Early satiety: Secondary | ICD-10-CM | POA: Insufficient documentation

## 2019-11-12 DIAGNOSIS — R1310 Dysphagia, unspecified: Secondary | ICD-10-CM | POA: Insufficient documentation

## 2019-11-12 HISTORY — PX: ESOPHAGOGASTRODUODENOSCOPY (EGD) WITH PROPOFOL: SHX5813

## 2019-11-12 HISTORY — PX: BIOPSY: SHX5522

## 2019-11-12 HISTORY — PX: SAVORY DILATION: SHX5439

## 2019-11-12 LAB — GLUCOSE, CAPILLARY: Glucose-Capillary: 92 mg/dL (ref 70–99)

## 2019-11-12 SURGERY — ESOPHAGOGASTRODUODENOSCOPY (EGD) WITH PROPOFOL
Anesthesia: Monitor Anesthesia Care

## 2019-11-12 MED ORDER — LACTATED RINGERS IV SOLN: INTRAVENOUS | Status: DC

## 2019-11-12 MED ORDER — PROPOFOL 10 MG/ML IV BOLUS
INTRAVENOUS | Status: DC | PRN
  Administered 2019-11-12: 10 mg via INTRAVENOUS

## 2019-11-12 MED ORDER — PROPOFOL 500 MG/50ML IV EMUL
INTRAVENOUS | Status: AC
  Filled 2019-11-12: qty 50

## 2019-11-12 MED ORDER — PROPOFOL 500 MG/50ML IV EMUL
INTRAVENOUS | Status: DC | PRN
  Administered 2019-11-12: 135 ug/kg/min via INTRAVENOUS

## 2019-11-12 SURGICAL SUPPLY — 15 items

## 2019-11-12 NOTE — Op Note (Signed)
[facility] Patient Name: Andrew Fisher Procedure Date: 11/12/2019 MRN: [id number] Attending MD: Jeani Hawking , MD Date of Birth: 1955-08-27 CSN: [id number] Age: 64 Admit Type: Outpatient Procedure:                Upper GI endoscopy Indications:              Dysphagia, Early satiety, Nausea Providers:                Jeani Hawking, MD, Alison Murray, RN, Leanne Lovely, Technician, Yevonne Pax, CRNA Referring MD:              Medicines:                Propofol per Anesthesia Complications:            No immediate complications. Estimated Blood Loss:     Estimated blood loss was minimal. Procedure:                Pre-Anesthesia Assessment:                           - Prior to the procedure, a History and Physical                            was performed, and patient medications and                            allergies were reviewed. The patient's tolerance of                            previous anesthesia was also reviewed. The risks                            and benefits of the procedure and the sedation                            options and risks were discussed with the patient.                            All questions were answered, and informed consent                            was obtained. Prior Anticoagulants: The patient has                            taken no previous anticoagulant or antiplatelet                            agents. ASA Grade Assessment: II - A patient with                            mild systemic disease. After reviewing the risks  and benefits, the patient was deemed in                            satisfactory condition to undergo the procedure.                           - Sedation was administered by an anesthesia                            professional. Deep sedation was attained.                           After obtaining informed consent, the endoscope was                             passed under direct vision. Throughout the                            procedure, the patient's blood pressure, pulse, and                            oxygen saturations were monitored continuously. The                            GIF-H190 ([id number] Olympus gastroscope was                            introduced through the mouth, and advanced to the                            second part of duodenum. The upper GI endoscopy was                            accomplished without difficulty. The patient                            tolerated the procedure well. Scope In: Scope Out: Findings:      No endoscopic abnormality was evident in the esophagus to explain the       patient's complaint of dysphagia. It was decided, however, to proceed       with dilation of the entire esophagus. A guidewire was placed and the       scope was withdrawn. Dilation was performed with a Savary dilator with       no resistance at 18 mm. The dilation site was examined following       endoscope reinsertion and showed no change. Biopsies were taken with a       cold forceps for histology.      A 1 cm hiatal hernia was present.      The stomach was normal.      The examined duodenum was normal.      The patient's mid to lower esophagus did exhibit some ridges and       biopsies of the entire esophagus were obtained to evaluation for EoE. Impression:               -  No endoscopic esophageal abnormality to explain                            patient's dysphagia. Esophagus dilated. Dilated.                            Biopsied.                           - 2 cm hiatal hernia.                           - Normal stomach.                           - Normal examined duodenum. Moderate Sedation:      Not Applicable - Patient had care per Anesthesia. Recommendation:           - Patient has a contact number available for                            emergencies. The signs and symptoms of potential                             delayed complications were discussed with the                            patient. Return to normal activities tomorrow.                            Written discharge instructions were provided to the                            patient.                           - Resume previous diet.                           - Continue present medications.                           - Await pathology results.                           - Return to GI clinic in 4 weeks. Procedure Code(s):        --- Professional ---                           [phone], Esophagogastroduodenoscopy, flexible,                            transoral; with insertion of guide wire followed by                            passage of dilator(s) through esophagus over guide  wire                           43239, 59, Esophagogastroduodenoscopy, flexible,                            transoral; with biopsy, single or multiple Diagnosis Code(s):        --- Professional ---                           R13.10, Dysphagia, unspecified                           K44.9, Diaphragmatic hernia without obstruction or                            gangrene                           R68.81, Early satiety                           R11.0, Nausea CPT copyright 2019 American [employer] Association. All rights reserved. The codes documented in this report are preliminary and upon coder review may  be revised to meet current compliance requirements. Jeani Hawking, MD Jeani Hawking, MD 11/12/2019 9:25:40 AM This report has been signed electronically. Number of Addenda: 0

## 2019-11-12 NOTE — H&P (Signed)
  Andrew Fisher HPI: In the past his EGD on 03/02/2009 showed a mild esophagitis and the procedure was performed for GERD complaints. Over the years he was well-controlled with Prevacid, but with in the past 8 months he reports having some issues with nausea and early satiety. He does not have much of an appetite and there is a nausea sensation when he eats. Also, he feels as if his swallowing as a "cut off" sensation, especially when he stretches his arms back. Even though he has these symptoms, the patient denies any weight loss. The patient is very rigid with taking his Prevacid.   Past Medical History:  Diagnosis Date  . Arthritis   . Asthma   . GERD (gastroesophageal reflux disease)   . Hearing loss   . Hemorrhoids   . Hyperlipidemia   . Low testosterone in male   . Peripheral neuropathy   . Sleep apnea    wears cpap  . Sore throat   . Wears glasses   . Wears hearing aid in both ears     Past Surgical History:  Procedure Laterality Date  . ANTERIOR CERVICAL DECOMP/DISCECTOMY FUSION N/A 01/09/2017   Procedure: ANTERIOR CERVICAL DECOMPRESSION FUSION, CERVICAL FOUR-FIVE, CERVICAL FIVE-SIX INSTRUMENTATION AND ALLOGRAFT;  Surgeon: Estill Bamberg, MD;  Location: [facility];  Service: Orthopedics;  Laterality: N/A;  ANTERIOR CERVICAL DECOMPRESSION FUSION, CERVICAL FOUR-FIVE, CERVICAL FIVE-SIX INSTRUMENTATION AND ALLOGRAFT  . APPENDECTOMY    . COLONOSCOPY    . NECK SURGERY    . SHOULDER SURGERY  2005   rotator cuff and tendons repaired    Family History  Problem Relation Age of Onset  . Diabetes Mother   . Hypertension Mother   . Healthy Father   . Cancer Other     Social History:  reports that he has quit smoking. His smoking use included cigarettes. He has a 1.50 pack-year smoking history. He has never used smokeless tobacco. He reports current alcohol use. He reports that he does not use drugs.  Allergies: No Known Allergies  Medications: Scheduled: Continuous:  No  results found for this or any previous visit (from the past 24 hour(s)).   No results found.  ROS:  As stated above in the HPI otherwise negative.  There were no vitals taken for this visit.    PE: Gen: NAD, Alert and Oriented HEENT:  [name]/AT, EOMI Neck: Supple, no LAD Lungs: CTA Bilaterally CV: RRR without M/G/R ABD: Soft, NTND, +BS Ext: No C/C/E  Assessment/Plan: 1) Nausea. 2) Early satiety. 3) Dysphagia.   A repeat EGD will be performed with his persistent symptoms of nausea, early satiety, and dysphagia. He denies any weight loss. Further recommendations will be made pending the findings.   Andrew Fisher D 11/12/2019, 7:35 AM

## 2019-11-12 NOTE — Anesthesia Procedure Notes (Signed)
Procedure Name: MAC Date/Time: 11/12/2019 8:49 AM Performed by: Niel Hummer, CRNA Pre-anesthesia Checklist: Patient identified, Emergency Drugs available, Suction available and Patient being monitored Oxygen Delivery Method: Simple face mask

## 2019-11-12 NOTE — Anesthesia Preprocedure Evaluation (Addendum)
Anesthesia Evaluation  Patient identified by MRN, date of birth, ID band Patient awake    Reviewed: Allergy & Precautions, NPO status , Patient's Chart, lab work & pertinent test results  Airway Mallampati: II  TM Distance: >3 FB Neck ROM: Limited    Dental  (+) Dental Advisory Given   Pulmonary asthma , sleep apnea and Continuous Positive Airway Pressure Ventilation , former smoker,    breath sounds clear to auscultation       Cardiovascular Exercise Tolerance: Good negative cardio ROS   Rhythm:Regular Rate:Normal     Neuro/Psych    GI/Hepatic Neg liver ROS, GERD  ,  Endo/Other  negative endocrine ROS  Renal/GU Renal disease     Musculoskeletal  (+) Arthritis ,   Abdominal   Peds  Hematology negative hematology ROS (+)   Anesthesia Other Findings   Reproductive/Obstetrics                             Lab Results  Component Value Date   WBC 3.0 (L) 09/14/2019   HGB 14.5 09/14/2019   HCT 45.7 09/14/2019   MCV 74 (L) 09/14/2019   PLT 128 (L) 09/14/2019   Lab Results  Component Value Date   CREATININE 1.13 05/19/2019   BUN 14 05/19/2019   NA 138 05/19/2019   K 4.2 05/19/2019   CL 101 05/19/2019   CO2 23 05/19/2019    Anesthesia Physical  Anesthesia Plan  ASA: II  Anesthesia Plan: MAC   Post-op Pain Management:    Induction: Intravenous  PONV Risk Score and Plan: 1 and Propofol infusion, TIVA and Treatment may vary due to age or medical condition  Airway Management Planned: Natural Airway and Simple Face Mask  Additional Equipment:   Intra-op Plan:   Post-operative Plan:   Informed Consent: I have reviewed the patients History and Physical, chart, labs and discussed the procedure including the risks, benefits and alternatives for the proposed anesthesia with the patient or authorized representative who has indicated his/her understanding and acceptance.      Dental advisory given  Plan Discussed with: CRNA  Anesthesia Plan Comments:        Anesthesia Quick Evaluation

## 2019-11-12 NOTE — Discharge Instructions (Signed)

## 2019-11-12 NOTE — Transfer of Care (Signed)
Immediate Anesthesia Transfer of Care Note  Patient: Andrew Fisher  Procedure(s) Performed: ESOPHAGOGASTRODUODENOSCOPY (EGD) WITH PROPOFOL (N/A ) BIOPSY SAVORY DILATION (N/A )  Patient Location: Endoscopy Unit  Anesthesia Type:MAC  Level of Consciousness: drowsy and responds to stimulation  Airway & Oxygen Therapy: Patient Spontanous Breathing and Patient connected to face mask oxygen  Post-op Assessment: Report given to RN, Post -op Vital signs reviewed and stable and Patient moving all extremities  Post vital signs: Reviewed and stable  Last Vitals:  Vitals Value Taken Time  BP    Temp    Pulse 65 11/12/19 0918  Resp 13 11/12/19 0918  SpO2 100 % 11/12/19 0918  Vitals shown include unvalidated device data.  Last Pain:  Vitals:   11/12/19 0753  TempSrc: Oral  PainSc: 0-No pain         Complications: No complications documented.

## 2019-11-12 NOTE — Anesthesia Postprocedure Evaluation (Signed)
Anesthesia Post Note  Patient: Andrew Fisher  Procedure(s) Performed: ESOPHAGOGASTRODUODENOSCOPY (EGD) WITH PROPOFOL (N/A ) BIOPSY SAVORY DILATION (N/A )     Patient location during evaluation: PACU Anesthesia Type: MAC Level of consciousness: awake and alert Pain management: pain level controlled Vital Signs Assessment: post-procedure vital signs reviewed and stable Respiratory status: spontaneous breathing, nonlabored ventilation and respiratory function stable Cardiovascular status: stable and blood pressure returned to baseline Postop Assessment: no apparent nausea or vomiting Anesthetic complications: no   No complications documented.  Last Vitals:  Vitals:   11/12/19 0930 11/12/19 0940  BP: (!) 170/94 (!) 159/93  Pulse: 62   Resp: 15 14  Temp:    SpO2: 97% 99%    Last Pain:  Vitals:   11/12/19 0940  TempSrc:   PainSc: 0-No pain                 Merlinda Frederick

## 2019-11-15 LAB — SURGICAL PATHOLOGY

## 2019-11-17 ENCOUNTER — Ambulatory Visit: Payer: POS | Admitting: Internal Medicine

## 2019-11-17 ENCOUNTER — Ambulatory Visit (INDEPENDENT_AMBULATORY_CARE_PROVIDER_SITE_OTHER): Payer: Medicare Other | Admitting: Internal Medicine

## 2019-11-17 ENCOUNTER — Encounter: Payer: Self-pay | Admitting: Internal Medicine

## 2019-11-17 ENCOUNTER — Other Ambulatory Visit: Payer: Self-pay

## 2019-11-17 VITALS — BP 132/84 | HR 71 | Temp 98.0°F | Ht 69.0 in | Wt 205.0 lb

## 2019-11-17 DIAGNOSIS — R7309 Other abnormal glucose: Secondary | ICD-10-CM

## 2019-11-17 DIAGNOSIS — Z0000 Encounter for general adult medical examination without abnormal findings: Secondary | ICD-10-CM | POA: Diagnosis not present

## 2019-11-17 DIAGNOSIS — E7800 Pure hypercholesterolemia, unspecified: Secondary | ICD-10-CM | POA: Diagnosis not present

## 2019-11-17 DIAGNOSIS — K219 Gastro-esophageal reflux disease without esophagitis: Secondary | ICD-10-CM | POA: Diagnosis not present

## 2019-11-17 DIAGNOSIS — Z79899 Other long term (current) drug therapy: Secondary | ICD-10-CM

## 2019-11-17 DIAGNOSIS — E291 Testicular hypofunction: Secondary | ICD-10-CM

## 2019-11-17 LAB — CBC
Hematocrit: 46.7 % (ref 37.5–51.0)
Hemoglobin: 14.8 g/dL (ref 13.0–17.7)
MCH: 23.5 pg — ABNORMAL LOW (ref 26.6–33.0)
MCHC: 31.7 g/dL (ref 31.5–35.7)
MCV: 74 fL — ABNORMAL LOW (ref 79–97)
Platelets: 147 10*3/uL — ABNORMAL LOW (ref 150–450)
RBC: 6.29 x10E6/uL — ABNORMAL HIGH (ref 4.14–5.80)
RDW: 18 % — ABNORMAL HIGH (ref 11.6–15.4)
WBC: 3.4 10*3/uL (ref 3.4–10.8)

## 2019-11-17 LAB — CMP14+EGFR
ALT: 19 IU/L (ref 0–44)
AST: 20 IU/L (ref 0–40)
Albumin/Globulin Ratio: 1.7 (ref 1.2–2.2)
Albumin: 4.6 g/dL (ref 3.8–4.8)
Alkaline Phosphatase: 88 IU/L (ref 48–121)
BUN/Creatinine Ratio: 15 (ref 10–24)
BUN: 17 mg/dL (ref 8–27)
Bilirubin Total: 0.5 mg/dL (ref 0.0–1.2)
CO2: 22 mmol/L (ref 20–29)
Calcium: 9.3 mg/dL (ref 8.6–10.2)
Chloride: 100 mmol/L (ref 96–106)
Creatinine, Ser: 1.11 mg/dL (ref 0.76–1.27)
GFR calc Af Amer: 80 mL/min/{1.73_m2} (ref >59)
GFR calc non Af Amer: 69 mL/min/{1.73_m2} (ref >59)
Globulin, Total: 2.7 g/dL (ref 1.5–4.5)
Glucose: 100 mg/dL — ABNORMAL HIGH (ref 65–99)
Potassium: 4.4 mmol/L (ref 3.5–5.2)
Sodium: 137 mmol/L (ref 134–144)
Total Protein: 7.3 g/dL (ref 6.0–8.5)

## 2019-11-17 LAB — POCT URINALYSIS DIPSTICK
Bilirubin, UA: NEGATIVE
Blood, UA: NEGATIVE
Glucose, UA: NEGATIVE
Ketones, UA: NEGATIVE
Leukocytes, UA: NEGATIVE
Nitrite, UA: NEGATIVE
Protein, UA: NEGATIVE
Spec Grav, UA: 1.015 (ref 1.010–1.025)
Urobilinogen, UA: 0.2 E.U./dL
pH, UA: 7.5 (ref 5.0–8.0)

## 2019-11-17 NOTE — Patient Instructions (Addendum)
STOP PREVACID/LANSOPRAZOLE  START DEXILANT 60MG  ONCE DAILY FOR REFLUX / NAUSEA    Andrew Fisher , Thank you for taking time to come for your Medicare Wellness Visit. I appreciate your ongoing commitment to your health goals. Please review the following plan we discussed and let me know if I can assist you in the future.   These are the goals we discussed: Goals    . DIET - EAT MORE FRUITS AND VEGETABLES    . DIET - REDUCE FAST FOOD INTAKE     He wants referral to nutritionist.        This is a list of the screening recommended for you and due dates:  Health Maintenance  Topic Date Due  . Pneumonia vaccines (1 of 2 - PCV13) 10/22/2019  . Flu Shot  10/24/2019  . HIV Screening  05/18/2020*  . Tetanus Vaccine  01/08/2025  . Colon Cancer Screening  08/17/2028  . COVID-19 Vaccine  Completed  .  Hepatitis C: One time screening is recommended by Center for Disease Control  (CDC) for  adults born from [birth year] through 1966.   Completed  *Topic was postponed. The date shown is not the original due date.

## 2019-11-17 NOTE — Progress Notes (Signed)
Subjective:   Andrew Fisher is a 64 y.o. male who presents for a Welcome to Medicare exam. He reports compliance with meds. He has no specific concerns or complaints at this time.   Review of Systems: Review of Systems  Constitutional: Negative.   HENT: Negative.   Eyes: Negative.   Respiratory: Negative.   Cardiovascular: Negative.   Gastrointestinal: Negative.   Genitourinary: Negative.   Musculoskeletal: Negative.   Skin: Negative.   Neurological: Negative.   Endo/Heme/Allergies: Negative.   Psychiatric/Behavioral: Negative.     Cardiac Risk Factors include: advanced age (>35mn, >>75women);dyslipidemia;male gender;obesity (BMI >30kg/m2)     Objective:    Today's Vitals   11/17/19 1017 11/17/19 1041  BP: 132/84   Pulse: 71   Temp: 98 F (36.7 C)   TempSrc: Oral   Weight: 205 lb (93 kg)   Height: _0  (1.753 m)   PainSc:  2    Body mass index is 30.27 kg/m.  Medications Outpatient Encounter Medications as of 11/17/2019  Medication Sig   albuterol (VENTOLIN HFA) 108 (90 Base) MCG/ACT inhaler USE 1 TO 2 INHALATIONS EVERY 6 HOURS AS NEEDED FOR WHEEZING OR SHORTNESS OF BREATH (Patient taking differently: Inhale 2 puffs into the lungs every 6 (six) hours as needed for wheezing or shortness of breath. )   aspirin EC 81 MG tablet Take 81 mg by mouth daily.   atorvastatin (LIPITOR) 10 MG tablet TAKE 1 TABLET DAILY (Patient taking differently: Take 10 mg by mouth daily. )   BELBUCA 300 MCG FILM Take 300 mcg by mouth every 12 (twelve) hours.    Blood Glucose Monitoring Suppl KIT Use as directed to check blood sugars 1 time per day dx:e11.22   EPINEPHrine 0.3 mg/0.3 mL IJ SOAJ injection Inject 0.3 mLs (0.3 mg total) into the muscle as needed for anaphylaxis. (Patient taking differently: Inject 0.3 mg into the muscle daily as needed for anaphylaxis. )   gabapentin (NEURONTIN) 300 MG capsule Take 300 mg by mouth 4 (four) times daily.   glucose blood test strip  Use as directed to check blood sugars 1 time per day dx:e11.22   Lancets 33G MISC Use as directed to check blood sugars 1 time per day dx:e11.22   lansoprazole (PREVACID) 30 MG capsule Take 30 mg by mouth daily.    LINZESS 145 MCG CAPS capsule Take 145 mcg by mouth daily as needed (constipation).    loratadine (CLARITIN) 10 MG tablet Take 10 mg by mouth daily.   magnesium oxide (MAG-OX) 400 MG tablet Take 400 mg by mouth daily.   montelukast (SINGULAIR) 10 MG tablet TAKE 1 TABLET DAILY (Patient taking differently: Take 10 mg by mouth daily. )   Multiple Vitamins-Minerals (MULTIVITAMIN PO) Take 1 tablet by mouth daily.   neomycin-polymyxin-hydrocortisone (CORTISPORIN) 3.5-10000-1 OTIC suspension Place 4 drops into both ears 4 (four) times daily. X 7 days   tadalafil (CIALIS) 5 MG tablet Take 5 mg by mouth daily.    tamsulosin (FLOMAX) 0.4 MG CAPS capsule Take 0.4 mg by mouth at bedtime.    Testosterone 30 MG/ACT SOLN Apply 30 mg topically daily. Apply under each arm   urea (CARMOL) 20 % cream Apply 1 application topically daily as needed (Feet pain).    [DISCONTINUED] ciprofloxacin (CIPRO) 500 MG tablet Take 1 tablet (500 mg total) by mouth 2 (two) times daily. One po bid x 7 days   No facility-administered encounter medications on file as of 11/17/2019.     History:  Past Medical History:  Diagnosis Date   Arthritis    Asthma    GERD (gastroesophageal reflux disease)    Hearing loss    Hemorrhoids    Hyperlipidemia    Low testosterone in male    Peripheral neuropathy    Sleep apnea    wears cpap   Sore throat    Wears glasses    Wears hearing aid in both ears    Past Surgical History:  Procedure Laterality Date   ANTERIOR CERVICAL DECOMP/DISCECTOMY FUSION N/A 01/09/2017   Procedure: ANTERIOR CERVICAL DECOMPRESSION FUSION, CERVICAL FOUR-FIVE, CERVICAL FIVE-SIX INSTRUMENTATION AND ALLOGRAFT;  Surgeon: Phylliss Bob, MD;  Location: [facility];  Service:  Orthopedics;  Laterality: N/A;  ANTERIOR CERVICAL DECOMPRESSION FUSION, CERVICAL FOUR-FIVE, CERVICAL FIVE-SIX INSTRUMENTATION AND ALLOGRAFT   APPENDECTOMY     BIOPSY  11/12/2019   Procedure: BIOPSY;  Surgeon: Carol Ada, MD;  Location: [facility];  Service: Endoscopy;;   COLONOSCOPY     ESOPHAGOGASTRODUODENOSCOPY (EGD) WITH PROPOFOL N/A 11/12/2019   Procedure: ESOPHAGOGASTRODUODENOSCOPY (EGD) WITH PROPOFOL;  Surgeon: Carol Ada, MD;  Location: [facility];  Service: Endoscopy;  Laterality: N/A;   NECK SURGERY     SAVORY DILATION N/A 11/12/2019   Procedure: SAVORY DILATION;  Surgeon: Carol Ada, MD;  Location: [facility];  Service: Endoscopy;  Laterality: N/A;   SHOULDER SURGERY  2005   rotator cuff and tendons repaired    Family History  Problem Relation Age of Onset   Diabetes Mother    Hypertension Mother    Healthy Father    Cancer Other    Social History   Occupational History   Not on file  Tobacco Use   Smoking status: Former Smoker    Packs/day: 0.25    Years: 6.00    Pack years: 1.50    Types: Cigarettes   Smokeless tobacco: Never Used  Scientific laboratory technician Use: Never used  Substance and Sexual Activity   Alcohol use: Yes    Comment: 5 times a week   Drug use: No   Sexual activity: Not on file   Tobacco Counseling Counseling given: Not Answered   Immunizations and Health Maintenance Immunization History  Administered Date(s) Administered   Influenza,inj,Quad PF,6+ Mos 11/16/2018   PFIZER SARS-COV-2 Vaccination 05/21/2019, 06/12/2019   Health Maintenance Due  Topic Date Due   PNA vac Low Risk Adult (1 of 2 - PCV13) Never done   INFLUENZA VACCINE  10/24/2019    Activities of Daily Living In your present state of health, do you have any difficulty performing the following activities: 11/17/2019 11/17/2019  Hearing? Y Y  Comment he has hearing aids -  Vision? N N  Difficulty concentrating or making decisions? N N  Walking  or climbing stairs? N N  Dressing or bathing? N N  Doing errands, shopping? N N  Preparing Food and eating ? N -  Using the Toilet? N -  In the past six months, have you accidently leaked urine? N -  Do you have problems with loss of bowel control? N -  Managing your Medications? N -  Managing your Finances? N -  Housekeeping or managing your Housekeeping? N -  Some recent data might be hidden    Physical Exam    Physical Exam Vitals and nursing note reviewed.  Constitutional:      Appearance: Normal appearance.  HENT:     Head: Normocephalic and atraumatic.     Right Ear: Tympanic membrane, ear canal and external  ear normal.     Left Ear: Tympanic membrane, ear canal and external ear normal.     Ears:     Comments: Wears hearing aids    Nose:     Comments: Deferred, masked    Mouth/Throat:     Comments: Deferred, masked,  Eyes:     Extraocular Movements: Extraocular movements intact.     Conjunctiva/sclera: Conjunctivae normal.     Pupils: Pupils are equal, round, and reactive to light.  Cardiovascular:     Rate and Rhythm: Normal rate and regular rhythm.     Pulses: Normal pulses.     Heart sounds: Normal heart sounds.  Pulmonary:     Effort: Pulmonary effort is normal.  Abdominal:     General: Abdomen is flat. Bowel sounds are normal.     Palpations: Abdomen is soft.  Genitourinary:    Comments: Deferred Musculoskeletal:        General: Normal range of motion.     Cervical back: Normal range of motion and neck supple.  Skin:    General: Skin is warm and dry.  Neurological:     Mental Status: He is alert and oriented to person, place, and time.  Psychiatric:        Mood and Affect: Affect normal.    (optional), or other factors deemed appropriate based on the beneficiary's medical and social history and current clinical standards.  Advanced Directives: Does Patient Have a Medical Advance Directive?: No Would patient like information on creating a medical  advance directive?: Yes (MAU/Ambulatory/Procedural Areas - Information given)    Assessment:    This is a routine wellness  examination for this patient .   Vision/Hearing screen  Hearing Screening   _0  _1  _2  _3  _4  _5  _6  _7  _8   Right ear:   _9     Left ear:   _10     Comments: He wears hearing aids.    Visual Acuity Screening   Right eye Left eye Both eyes  Without correction:     With correction: _11    Dietary issues and exercise activities discussed:  Current Exercise Habits: Home exercise routine, Type of exercise: walking, Time (Minutes): 60, Frequency (Times/Week): 4, Weekly Exercise (Minutes/Week): 240, Intensity: Moderate, Exercise limited by: orthopedic condition(s)  Goals     DIET - EAT MORE FRUITS AND VEGETABLES     DIET - REDUCE FAST FOOD INTAKE     He wants referral to nutritionist.        Depression Screen PHQ 2/9 Scores 05/19/2019 05/04/2018 03/26/2018 01/30/2018  PHQ - 2 Score 0 0 0 0     Fall Risk Fall Risk  11/17/2019  Falls in the past year? 0  Number falls in past yr: 0    Cognitive Function     6CIT Screen 11/17/2019  What Year? 0 points  What month? 0 points  What time? 0 points  Count back from 20 0 points  Months in reverse 0 points  Repeat phrase 0 points    Patient Care Team: Glendale Chard, MD as PCP - General (Internal Medicine)     Plan:   1. Welcome to Medicare preventive visit  The Welcome to Medicare annual wellness visit was performed including discussion of advanced directives, assessment of functional status and cognitive function. Physical exam was performed. DRE deferred, as per patient. EKG performed, NSR w/ inferior infarct, age not determined - no new changes. PATIENT IS ADVISED  TO GET 30-45 MINUTES REGULAR EXERCISE NO LESS THAN FOUR TO FIVE DAYS PER WEEK - BOTH WEIGHTBEARING EXERCISES AND AEROBIC ARE RECOMMENDED.  PATIENT IS ADVISED TO  FOLLOW A HEALTHY DIET WITH AT LEAST SIX FRUITS/VEGGIES PER DAY, DECREASE INTAKE OF RED MEAT, AND TO INCREASE FISH INTAKE TO TWO DAYS PER WEEK.  MEATS/FISH SHOULD NOT BE FRIED, BAKED OR BROILED IS PREFERABLE.  I SUGGEST WEARING SPF 50 SUNSCREEN ON EXPOSED PARTS AND ESPECIALLY WHEN IN THE DIRECT SUNLIGHT FOR AN EXTENDED PERIOD OF TIME.  PLEASE AVOID FAST FOOD RESTAURANTS AND INCREASE YOUR WATER INTAKE.  - EKG 12-Lead - POCT Urinalysis Dipstick (81002)  2. Pure hypercholesterolemia Comments: Chronic. I will check lipid panel. He is encouraged to avoid fried foods and to aim for at least 150 minutes of exercise per week. I will also refer him to Nutrition for further dietary counseling. He will rto in six months for re-evaluation.  - Lipid panel - Referral to Nutrition and Diabetes Services  3. Other abnormal glucose Comments: Previous hba1c has been elevated. I will check bmp, hba1c today. Enocuraged to limit intake of sugary beverages, including diet.  - Hemoglobin A1c  4. Hypogonadism in male Comments: Previous testosterone levels reviewed. I will check free/total testosterone level today. Also followed by Urology. - Testosterone,Free and Total  5. Gastroesophageal reflux disease without esophagitis Comments: Chronic. Reminded him to stop eating 3 hours prior to lying down.   6. Drug therapy - CMP14+EGFR - CBC no Diff    I have personally reviewed and noted the following in the patients chart:    Medical and social history  Use of alcohol, tobacco or illicit drugs   Current medications and supplements  Functional ability and status  Nutritional status  Physical activity  Advanced directives  List of other physicians  Hospitalizations, surgeries, and ER visits in previous 12 months  Vitals  Screenings to include cognitive, depression, and falls  Referrals and appointments  In addition, I have reviewed and discussed with patient certain preventive protocols,  quality metrics, and best practice recommendations. A written personalized care plan for preventive services as well as general preventive health recommendations were provided to patient.    [name], MD 12/05/2019

## 2019-11-18 LAB — LIPID PANEL
Chol/HDL Ratio: 2.8 ratio (ref 0.0–5.0)
Cholesterol, Total: 117 mg/dL (ref 100–199)
HDL: 42 mg/dL (ref >39)
LDL Chol Calc (NIH): 59 mg/dL (ref 0–99)
Triglycerides: 79 mg/dL (ref 0–149)
VLDL Cholesterol Cal: 16 mg/dL (ref 5–40)

## 2019-11-18 LAB — HEMOGLOBIN A1C
Est. average glucose Bld gHb Est-mCnc: 126 mg/dL
Hgb A1c MFr Bld: 6 % — ABNORMAL HIGH (ref 4.8–5.6)

## 2019-12-28 ENCOUNTER — Telehealth: Payer: Self-pay

## 2019-12-28 NOTE — Telephone Encounter (Signed)
The pt called and left a message that he was calling about a dermatology referral he was supposed to have and that he had his nerve conduction study and that he was told it's nothing wrong so he wanted to know what Dr. Allyne Gee wanted him to do next.  I left the pt a message that Dr. Sharyn Creamer office called him in January and that the pt told them that he would call them back to schedule an appt.

## 2020-02-03 ENCOUNTER — Telehealth: Payer: Self-pay

## 2020-02-03 ENCOUNTER — Other Ambulatory Visit: Payer: Self-pay

## 2020-02-03 ENCOUNTER — Ambulatory Visit (INDEPENDENT_AMBULATORY_CARE_PROVIDER_SITE_OTHER): Payer: Medicare Other | Admitting: Internal Medicine

## 2020-02-03 VITALS — BP 138/80 | HR 80 | Temp 98.6°F | Ht 68.2 in | Wt 205.6 lb

## 2020-02-03 DIAGNOSIS — M7989 Other specified soft tissue disorders: Secondary | ICD-10-CM

## 2020-02-03 DIAGNOSIS — M79641 Pain in right hand: Secondary | ICD-10-CM | POA: Diagnosis not present

## 2020-02-03 DIAGNOSIS — R2242 Localized swelling, mass and lump, left lower limb: Secondary | ICD-10-CM

## 2020-02-03 DIAGNOSIS — E6609 Other obesity due to excess calories: Secondary | ICD-10-CM

## 2020-02-03 DIAGNOSIS — M79642 Pain in left hand: Secondary | ICD-10-CM | POA: Diagnosis not present

## 2020-02-03 DIAGNOSIS — Z6831 Body mass index (BMI) 31.0-31.9, adult: Secondary | ICD-10-CM

## 2020-02-03 DIAGNOSIS — E66811 Other obesity due to excess calories: Secondary | ICD-10-CM

## 2020-02-03 DIAGNOSIS — H9193 Unspecified hearing loss, bilateral: Secondary | ICD-10-CM

## 2020-02-03 NOTE — Telephone Encounter (Signed)
Patient notified

## 2020-02-03 NOTE — Progress Notes (Signed)
I,Tianna Badgett,acting as a Education administrator for [name], MD.,have documented all relevant documentation on the behalf of [name], MD,as directed by  [name], MD while in the presence of [name], MD.  This visit occurred during the SARS-CoV-2 public health emergency.  Safety protocols were in place, including screening questions prior to the visit, additional usage of staff PPE, and extensive cleaning of exam room while observing appropriate contact time as indicated for disinfecting solutions.  Subjective:     Patient ID: Andrew Fisher , male    DOB: [birth year] , 64 y.o.   MRN: [id number]   Chief Complaint  Patient presents with  . Mass    left leg    HPI  Patient is here today to discuss lump on left leg. Patient states that this is not a new concern but feels that it is bigger. He would also like to be referred to ENT to have hearing checked. He reports being evaluated by [facility] for hearing aids but his prescription is never changed and he still is unable to hear with new hearing aids.    Past Medical History:  Diagnosis Date  . Arthritis   . Asthma   . GERD (gastroesophageal reflux disease)   . Hearing loss   . Hemorrhoids   . Hyperlipidemia   . Low testosterone in male   . Peripheral neuropathy   . Sleep apnea    wears cpap  . Sore throat   . Wears glasses   . Wears hearing aid in both ears      Family History  Problem Relation Age of Onset  . Diabetes Mother   . Hypertension Mother   . Healthy Father   . Cancer Other      Current Outpatient Medications:  .  albuterol (VENTOLIN HFA) 108 (90 Base) MCG/ACT inhaler, USE 1 TO 2 INHALATIONS EVERY 6 HOURS AS NEEDED FOR WHEEZING OR SHORTNESS OF BREATH (Patient taking differently: Inhale 2 puffs into the lungs every 6 (six) hours as needed for wheezing or shortness of breath. ), Disp: 17 g, Rfl: 4 .  aspirin EC 81 MG tablet, Take 81 mg by mouth daily., Disp: , Rfl:  .  atorvastatin (LIPITOR) 10 MG  tablet, TAKE 1 TABLET DAILY (Patient taking differently: Take 10 mg by mouth daily. ), Disp: 90 tablet, Rfl: 3 .  BELBUCA 300 MCG FILM, Take 300 mcg by mouth every 12 (twelve) hours. , Disp: , Rfl:  .  Blood Glucose Monitoring Suppl KIT, Use as directed to check blood sugars 1 time per day dx:e11.22, Disp: 1 kit, Rfl: 1 .  EPINEPHrine 0.3 mg/0.3 mL IJ SOAJ injection, Inject 0.3 mLs (0.3 mg total) into the muscle as needed for anaphylaxis. (Patient taking differently: Inject 0.3 mg into the muscle daily as needed for anaphylaxis. ), Disp: 1 each, Rfl: 2 .  gabapentin (NEURONTIN) 300 MG capsule, Take 300 mg by mouth 4 (four) times daily., Disp: , Rfl:  .  glucose blood test strip, Use as directed to check blood sugars 1 time per day dx:e11.22, Disp: 150 each, Rfl: 2 .  Lancets 33G MISC, Use as directed to check blood sugars 1 time per day dx:e11.22, Disp: 150 each, Rfl: 2 .  lansoprazole (PREVACID) 30 MG capsule, Take 30 mg by mouth daily. , Disp: , Rfl:  .  LINZESS 145 MCG CAPS capsule, Take 145 mcg by mouth daily as needed (constipation). , Disp: , Rfl:  .  loratadine (CLARITIN)  10 MG tablet, Take 10 mg by mouth daily., Disp: , Rfl:  .  magnesium oxide (MAG-OX) 400 MG tablet, Take 400 mg by mouth daily., Disp: , Rfl:  .  montelukast (SINGULAIR) 10 MG tablet, TAKE 1 TABLET DAILY (Patient taking differently: Take 10 mg by mouth daily. ), Disp: 90 tablet, Rfl: 1 .  Multiple Vitamins-Minerals (MULTIVITAMIN PO), Take 1 tablet by mouth daily., Disp: , Rfl:  .  neomycin-polymyxin-hydrocortisone (CORTISPORIN) 3.5-10000-1 OTIC suspension, Place 4 drops into both ears 4 (four) times daily. X 7 days, Disp: 10 mL, Rfl: 0 .  tadalafil (CIALIS) 5 MG tablet, Take 5 mg by mouth daily. , Disp: , Rfl:  .  tamsulosin (FLOMAX) 0.4 MG CAPS capsule, Take 0.4 mg by mouth at bedtime. , Disp: , Rfl:  .  Testosterone 30 MG/ACT SOLN, Apply 30 mg topically daily. Apply under each arm, Disp: , Rfl:  .  urea (CARMOL) 20 % cream,  Apply 1 application topically daily as needed (Feet pain). , Disp: , Rfl:    No Known Allergies   Review of Systems  Constitutional: Negative.   HENT: Positive for hearing loss.        Would like a referral.   Respiratory: Negative.   Cardiovascular: Negative.   Gastrointestinal: Negative.   Skin:       He c/o swelling on left lateral hip. Area is not tender. Denies fall/trauma. States area of swelling has enlarged.   Neurological: Negative.      Today's Vitals   02/03/20 1040  BP: 138/80  Pulse: 80  Temp: 98.6 F (37 C)  TempSrc: Oral  Weight: 205 lb 9.6 oz (93.3 kg)  Height: 5' 8.2" (1.732 m)   Body mass index is 31.08 kg/m.  Wt Readings from Last 3 Encounters:  02/03/20 205 lb 9.6 oz (93.3 kg)  11/17/19 205 lb (93 kg)  11/07/19 214 lb (97.1 kg)     Objective:  Physical Exam Vitals and nursing note reviewed.  Constitutional:      Appearance: Normal appearance.  HENT:     Head: Normocephalic and atraumatic.  Cardiovascular:     Rate and Rhythm: Normal rate and regular rhythm.     Heart sounds: Normal heart sounds.  Pulmonary:     Effort: Pulmonary effort is normal.     Breath sounds: Normal breath sounds.  Musculoskeletal:     Comments: Neg squeeze test b/l  Skin:    General: Skin is warm.     Comments: Soft tissue mass, 1x2, nontender-left lateral hip No overlying erythema, no rash noted   Neurological:     General: No focal deficit present.     Mental Status: He is alert.  Psychiatric:        Mood and Affect: Mood normal.         Assessment And Plan:     1. Soft tissue mass Comments: I will refer him for ultrasound of the area in question. He is agreeable to treatment plan.  - [place] LT LOWER EXTREM LTD SOFT TISSUE NON VASCULAR; Future  2. Hearing deficit, bilateral Comments: I will refer him to ENT for formal audiology evaluation as requested.  - Ambulatory referral to ENT  3. Bilateral hand pain Comments: I will check arthritis panel. I will  make further recommendations once his labs are available for review.  - ANA, IFA (with reflex) - CYCLIC CITRUL PEPTIDE ANTIBODY, IGG/IGA - Rheumatoid factor - Sedimentation rate - Uric acid  4. Class 1 obesity due  to excess calories with serious comorbidity and body mass index (BMI) of 31.0 to 31.9 in adult He is encouraged to initially strive for BMI less than 29 to decrease cardiac risk. He is advised to exercise no less than 150 minutes per week.      Patient was given opportunity to ask questions. Patient verbalized understanding of the plan and was able to repeat key elements of the plan. All questions were answered to their satisfaction.  [name], MD   I, [name], MD, have reviewed all documentation for this visit. The documentation on 03/10/20 for the exam, diagnosis, procedures, and orders are all accurate and complete.  THE PATIENT IS ENCOURAGED TO PRACTICE SOCIAL DISTANCING DUE TO THE COVID-19 PANDEMIC.

## 2020-02-03 NOTE — Telephone Encounter (Signed)
-----   Message from Dorothyann Peng, MD sent at 01/29/2020  7:18 PM EDT ----- We ca discuss further at his next visit. IF he is having issues, he can schedule appt to discuss next steps.  ----- Message ----- From: Mariam Dollar, CMA Sent: 01/05/2020  11:41 AM EDT To: Dorothyann Peng, MD  The pt wanted to know what to do next, did Dr. Allyne Gee want to see him again because his nerve conduction study was normal.

## 2020-02-07 LAB — URIC ACID: Uric Acid: 5.4 mg/dL (ref 3.8–8.4)

## 2020-02-07 LAB — RHEUMATOID FACTOR: Rheumatoid fact SerPl-aCnc: <10 IU/mL (ref 0.0–13.9)

## 2020-02-07 LAB — ANTINUCLEAR ANTIBODIES, IFA: ANA Titer 1: NEGATIVE

## 2020-02-07 LAB — SEDIMENTATION RATE: Sed Rate: 8 mm/hr (ref 0–30)

## 2020-02-07 LAB — CYCLIC CITRUL PEPTIDE ANTIBODY, IGG/IGA: Cyclic Citrullin Peptide Ab: 7 units (ref 0–19)

## 2020-03-10 ENCOUNTER — Encounter: Payer: Self-pay | Admitting: Internal Medicine

## 2020-03-14 ENCOUNTER — Telehealth: Payer: Self-pay

## 2020-03-14 NOTE — Telephone Encounter (Signed)
I returned the pt's call and left him a message at his request that the referral for the ultrasound was for the mass on his leg.

## 2020-04-06 ENCOUNTER — Ambulatory Visit (INDEPENDENT_AMBULATORY_CARE_PROVIDER_SITE_OTHER): Payer: Medicare Other | Admitting: Otolaryngology

## 2020-04-06 ENCOUNTER — Other Ambulatory Visit: Payer: Self-pay

## 2020-04-06 VITALS — Temp 97.0°F

## 2020-04-06 DIAGNOSIS — H903 Sensorineural hearing loss, bilateral: Secondary | ICD-10-CM | POA: Diagnosis not present

## 2020-04-06 NOTE — Progress Notes (Signed)
HPI: Andrew Fisher is a 65 y.o. male who presents is referred by by his PCP for evaluation of hearing problems.  He has had a long history of hearing problems in has had hearing aids that he got from the [place] system for a number of years.  He has noticed gradual decline in his hearing and more difficulty using the hearing aids and presents here for a second opinion concerning his hearing loss and hearing aid use.  He has had no pain or drainage from the ears..  Past Medical History:  Diagnosis Date  . Arthritis   . Asthma   . GERD (gastroesophageal reflux disease)   . Hearing loss   . Hemorrhoids   . Hyperlipidemia   . Low testosterone in male   . Peripheral neuropathy   . Sleep apnea    wears cpap  . Sore throat   . Wears glasses   . Wears hearing aid in both ears    Past Surgical History:  Procedure Laterality Date  . ANTERIOR CERVICAL DECOMP/DISCECTOMY FUSION N/A 01/09/2017   Procedure: ANTERIOR CERVICAL DECOMPRESSION FUSION, CERVICAL FOUR-FIVE, CERVICAL FIVE-SIX INSTRUMENTATION AND ALLOGRAFT;  Surgeon: Phylliss Bob, MD;  Location: [facility];  Service: Orthopedics;  Laterality: N/A;  ANTERIOR CERVICAL DECOMPRESSION FUSION, CERVICAL FOUR-FIVE, CERVICAL FIVE-SIX INSTRUMENTATION AND ALLOGRAFT  . APPENDECTOMY    . BIOPSY  11/12/2019   Procedure: BIOPSY;  Surgeon: Carol Ada, MD;  Location: [facility];  Service: Endoscopy;;  . COLONOSCOPY    . ESOPHAGOGASTRODUODENOSCOPY (EGD) WITH PROPOFOL N/A 11/12/2019   Procedure: ESOPHAGOGASTRODUODENOSCOPY (EGD) WITH PROPOFOL;  Surgeon: Carol Ada, MD;  Location: [facility];  Service: Endoscopy;  Laterality: N/A;  . NECK SURGERY    . SAVORY DILATION N/A 11/12/2019   Procedure: SAVORY DILATION;  Surgeon: Carol Ada, MD;  Location: [facility];  Service: Endoscopy;  Laterality: N/A;  . SHOULDER SURGERY  2005   rotator cuff and tendons repaired   Social History   Socioeconomic History  . Marital status: Married    Spouse name: Not on  file  . Number of children: Not on file  . Years of education: Not on file  . Highest education level: Not on file  Occupational History  . Not on file  Tobacco Use  . Smoking status: Former Smoker    Packs/day: 0.25    Years: 6.00    Pack years: 1.50    Types: Cigarettes  . Smokeless tobacco: Never Used  Vaping Use  . Vaping Use: Never used  Substance and Sexual Activity  . Alcohol use: Yes    Comment: 5 times a week  . Drug use: No  . Sexual activity: Not on file  Other Topics Concern  . Not on file  Social History Narrative  . Not on file   Social Determinants of Health   Financial Resource Strain: Not on file  Food Insecurity: Not on file  Transportation Needs: Not on file  Physical Activity: Not on file  Stress: Not on file  Social Connections: Not on file   Family History  Problem Relation Age of Onset  . Diabetes Mother   . Hypertension Mother   . Healthy Father   . Cancer Other    No Known Allergies Prior to Admission medications   Medication Sig Start Date End Date Taking? Authorizing Provider  albuterol (VENTOLIN HFA) 108 (90 Base) MCG/ACT inhaler USE 1 TO 2 INHALATIONS EVERY 6 HOURS AS NEEDED FOR WHEEZING OR SHORTNESS OF BREATH Patient taking differently: Inhale 2  puffs into the lungs every 6 (six) hours as needed for wheezing or shortness of breath.  09/30/19   Glendale Chard, MD  aspirin EC 81 MG tablet Take 81 mg by mouth daily.    [provider]  atorvastatin (LIPITOR) 10 MG tablet TAKE 1 TABLET DAILY Patient taking differently: Take 10 mg by mouth daily.  05/17/19   Glendale Chard, MD  BELBUCA 300 MCG FILM Take 300 mcg by mouth every 12 (twelve) hours.  11/06/18   [provider]  Blood Glucose Monitoring Suppl KIT Use as directed to check blood sugars 1 time per day dx:e11.22 07/21/19   Glendale Chard, MD  EPINEPHrine 0.3 mg/0.3 mL IJ SOAJ injection Inject 0.3 mLs (0.3 mg total) into the muscle as needed for anaphylaxis. Patient  taking differently: Inject 0.3 mg into the muscle daily as needed for anaphylaxis.  09/13/19   Glendale Chard, MD  gabapentin (NEURONTIN) 300 MG capsule Take 300 mg by mouth 4 (four) times daily.    [provider]  glucose blood test strip Use as directed to check blood sugars 1 time per day dx:e11.22 07/21/19   Glendale Chard, MD  Lancets 33G MISC Use as directed to check blood sugars 1 time per day dx:e11.22 07/21/19   Glendale Chard, MD  lansoprazole (PREVACID) 30 MG capsule Take 30 mg by mouth daily.     [provider]  LINZESS 145 MCG CAPS capsule Take 145 mcg by mouth daily as needed (constipation).  08/28/18   [provider]  loratadine (CLARITIN) 10 MG tablet Take 10 mg by mouth daily.    [provider]  magnesium oxide (MAG-OX) 400 MG tablet Take 400 mg by mouth daily.    [provider]  montelukast (SINGULAIR) 10 MG tablet TAKE 1 TABLET DAILY Patient taking differently: Take 10 mg by mouth daily.  08/30/19   Glendale Chard, MD  Multiple Vitamins-Minerals (MULTIVITAMIN PO) Take 1 tablet by mouth daily.    [provider]  neomycin-polymyxin-hydrocortisone (CORTISPORIN) 3.5-10000-1 OTIC suspension Place 4 drops into both ears 4 (four) times daily. X 7 days 11/07/19   Veryl Speak, MD  tadalafil (CIALIS) 5 MG tablet Take 5 mg by mouth daily.  09/30/18   [provider]  tamsulosin (FLOMAX) 0.4 MG CAPS capsule Take 0.4 mg by mouth at bedtime.     [provider]  Testosterone 30 MG/ACT SOLN Apply 30 mg topically daily. Apply under each arm 10/18/19   [provider]  urea (CARMOL) 20 % cream Apply 1 application topically daily as needed (Feet pain).     [provider]     Positive ROS: Otherwise negative  All other systems have been reviewed and were otherwise negative with the exception of those mentioned in the HPI and as above.  Physical Exam: Constitutional: Alert, well-appearing, no acute  distress Ears: External ears without lesions or tenderness.  He had minimal wax buildup that was removed.  TMs were otherwise clear but slightly retracted with no middle ear space abnormality noted. Nasal: External nose without lesions. Septum with minimal deformity. Clear nasal passages Oral: Lips and gums without lesions. Tongue and palate mucosa without lesions. Posterior oropharynx clear. Neck: No palpable adenopathy or masses Respiratory: Breathing comfortably  Skin: No facial/neck lesions or rash noted.  Audiologic testing demonstrated moderate bilateral sensorineural hearing loss which was symmetric with type C tympanograms bilaterally.  He had no significant conductive component to the hearing loss.  SRT's were 45 DB on the  right and 50 dB on the left.  Procedures  Assessment: Bilateral moderate sensorineural hearing loss.  Plan: Gave the patient a copy of the audiogram. I discussed with him briefly concerning the only option of treatment for this hearing loss would be hearing aids.  There are variety of different hearing aids that may be more beneficial than what he is using and directed him toward our audiologist to discuss hearing aid options.   Radene Journey, MD   CC:

## 2020-05-01 ENCOUNTER — Encounter (INDEPENDENT_AMBULATORY_CARE_PROVIDER_SITE_OTHER): Payer: Self-pay

## 2020-05-23 ENCOUNTER — Ambulatory Visit: Payer: Medicare Other | Admitting: Internal Medicine

## 2020-10-29 ENCOUNTER — Encounter (HOSPITAL_COMMUNITY): Payer: Self-pay | Admitting: Emergency Medicine

## 2020-10-29 ENCOUNTER — Other Ambulatory Visit: Payer: Self-pay

## 2020-10-29 ENCOUNTER — Emergency Department (HOSPITAL_COMMUNITY)
Admission: EM | Admit: 2020-10-29 | Discharge: 2020-10-29 | Disposition: A | Discharge status: Home / Self Care | Payer: Medicare Other | Attending: Emergency Medicine | Admitting: Emergency Medicine

## 2020-10-29 ENCOUNTER — Emergency Department (HOSPITAL_COMMUNITY): Payer: Medicare Other

## 2020-10-29 DIAGNOSIS — U071 COVID-19: Secondary | ICD-10-CM

## 2020-10-29 DIAGNOSIS — R059 Cough, unspecified: Secondary | ICD-10-CM | POA: Diagnosis present

## 2020-10-29 DIAGNOSIS — J45909 Unspecified asthma, uncomplicated: Secondary | ICD-10-CM | POA: Insufficient documentation

## 2020-10-29 DIAGNOSIS — Z87891 Personal history of nicotine dependence: Secondary | ICD-10-CM | POA: Insufficient documentation

## 2020-10-29 DIAGNOSIS — Z7982 Long term (current) use of aspirin: Secondary | ICD-10-CM | POA: Insufficient documentation

## 2020-10-29 MED ORDER — MOLNUPIRAVIR EUA 200MG CAPSULE: 4.0000 | ORAL_CAPSULE | Freq: Two times a day (BID) | ORAL | 0 refills | Status: AC

## 2020-10-29 NOTE — ED Triage Notes (Signed)
Pt reports positive home covid test today. Pt states wife and daughter are positive as well. Pt c/o lightheadedness and productive cough. Pt bp high at this point but pt says this is normal when at dr offices.

## 2020-10-29 NOTE — Discharge Instructions (Addendum)
Start the medication today if possible.  Take as directed until its finished.  You may continue to use your albuterol inhaler as needed.  Follow-up with your primary care provider for recheck or return to the emergency department if you develop any worsening symptoms.

## 2020-10-30 NOTE — ED Provider Notes (Signed)
[facility] EMERGENCY DEPARTMENT Provider Note   CSN: [id number] Arrival date & time: 10/29/20  1554     History No chief complaint on file.   Andrew Fisher is a 65 y.o. male.  HPI      Andrew Fisher is a 65 y.o. male who presents to the Emergency Department here requesting evaluation after having a positive COVID home test earlier today.  He states that his spouse and daughter have also tested positive.  His spouse was started on Paxlovid on Friday.  He is requesting prescription for same.  He reports overall feeling well but has some productive cough.  He has been taking over-the-counter cold and flu medication.  He is vaccinated x2 and 1 booster.  He denies any chest pain, vomiting, diarrhea, and shortness of breath.  Nothing makes his symptoms better or worse.     Past Medical History:  Diagnosis Date   Arthritis    Asthma    GERD (gastroesophageal reflux disease)    Hearing loss    Hemorrhoids    Hyperlipidemia    Low testosterone in male    Peripheral neuropathy    Sleep apnea    wears cpap   Sore throat    Wears glasses    Wears hearing aid in both ears     Patient Active Problem List   Diagnosis Date Noted   Visit for TB skin test 05/04/2018   Pure hypercholesterolemia 03/26/2018   Family history of colon cancer 03/26/2018   Cough 03/26/2018   Allergic rhinitis due to allergen 03/26/2018   Hematoma of neck 01/11/2017   S/P cervical spinal fusion 01/11/2017   Radiculopathy 01/09/2017   Hemorrhoids, internal 12/11/2010    Past Surgical History:  Procedure Laterality Date   ANTERIOR CERVICAL DECOMP/DISCECTOMY FUSION N/A 01/09/2017   Procedure: ANTERIOR CERVICAL DECOMPRESSION FUSION, CERVICAL FOUR-FIVE, CERVICAL FIVE-SIX INSTRUMENTATION AND ALLOGRAFT;  Surgeon: Phylliss Bob, MD;  Location: [facility];  Service: Orthopedics;  Laterality: N/A;  ANTERIOR CERVICAL DECOMPRESSION FUSION, CERVICAL FOUR-FIVE, CERVICAL FIVE-SIX INSTRUMENTATION AND ALLOGRAFT    APPENDECTOMY     BIOPSY  11/12/2019   Procedure: BIOPSY;  Surgeon: Carol Ada, MD;  Location: [facility];  Service: Endoscopy;;   COLONOSCOPY     ESOPHAGOGASTRODUODENOSCOPY (EGD) WITH PROPOFOL N/A 11/12/2019   Procedure: ESOPHAGOGASTRODUODENOSCOPY (EGD) WITH PROPOFOL;  Surgeon: Carol Ada, MD;  Location: [facility];  Service: Endoscopy;  Laterality: N/A;   NECK SURGERY     SAVORY DILATION N/A 11/12/2019   Procedure: SAVORY DILATION;  Surgeon: Carol Ada, MD;  Location: [facility];  Service: Endoscopy;  Laterality: N/A;   SHOULDER SURGERY  2005   rotator cuff and tendons repaired       Family History  Problem Relation Age of Onset   Diabetes Mother    Hypertension Mother    Healthy Father    Cancer Other     Social History   Tobacco Use   Smoking status: Former    Packs/day: 0.25    Years: 6.00    Pack years: 1.50    Types: Cigarettes   Smokeless tobacco: Never  Vaping Use   Vaping Use: Never used  Substance Use Topics   Alcohol use: Yes    Comment: 5 times a week   Drug use: No    Home Medications Prior to Admission medications   Medication Sig Start Date End Date Taking? Authorizing Provider  molnupiravir EUA 200 mg CAPS Take 4 capsules (800 mg total) by mouth 2 (two) times  daily for 5 days. 10/29/20 11/03/20 Yes Damyah Gugel, PA-C  albuterol (VENTOLIN HFA) 108 (90 Base) MCG/ACT inhaler USE 1 TO 2 INHALATIONS EVERY 6 HOURS AS NEEDED FOR WHEEZING OR SHORTNESS OF BREATH Patient taking differently: Inhale 2 puffs into the lungs every 6 (six) hours as needed for wheezing or shortness of breath.  09/30/19   Glendale Chard, MD  aspirin EC 81 MG tablet Take 81 mg by mouth daily.    [provider]  atorvastatin (LIPITOR) 10 MG tablet TAKE 1 TABLET DAILY Patient taking differently: Take 10 mg by mouth daily.  05/17/19   Glendale Chard, MD  BELBUCA 300 MCG FILM Take 300 mcg by mouth every 12 (twelve) hours.  11/06/18   [provider]  Blood  Glucose Monitoring Suppl KIT Use as directed to check blood sugars 1 time per day dx:e11.22 07/21/19   Glendale Chard, MD  EPINEPHrine 0.3 mg/0.3 mL IJ SOAJ injection Inject 0.3 mLs (0.3 mg total) into the muscle as needed for anaphylaxis. Patient taking differently: Inject 0.3 mg into the muscle daily as needed for anaphylaxis.  09/13/19   Glendale Chard, MD  gabapentin (NEURONTIN) 300 MG capsule Take 300 mg by mouth 4 (four) times daily.    [provider]  glucose blood test strip Use as directed to check blood sugars 1 time per day dx:e11.22 07/21/19   Glendale Chard, MD  Lancets 33G MISC Use as directed to check blood sugars 1 time per day dx:e11.22 07/21/19   Glendale Chard, MD  lansoprazole (PREVACID) 30 MG capsule Take 30 mg by mouth daily.     [provider]  LINZESS 145 MCG CAPS capsule Take 145 mcg by mouth daily as needed (constipation).  08/28/18   [provider]  loratadine (CLARITIN) 10 MG tablet Take 10 mg by mouth daily.    [provider]  magnesium oxide (MAG-OX) 400 MG tablet Take 400 mg by mouth daily.    [provider]  montelukast (SINGULAIR) 10 MG tablet TAKE 1 TABLET DAILY Patient taking differently: Take 10 mg by mouth daily.  08/30/19   Glendale Chard, MD  Multiple Vitamins-Minerals (MULTIVITAMIN PO) Take 1 tablet by mouth daily.    [provider]  neomycin-polymyxin-hydrocortisone (CORTISPORIN) 3.5-10000-1 OTIC suspension Place 4 drops into both ears 4 (four) times daily. X 7 days 11/07/19   Veryl Speak, MD  tadalafil (CIALIS) 5 MG tablet Take 5 mg by mouth daily.  09/30/18   [provider]  tamsulosin (FLOMAX) 0.4 MG CAPS capsule Take 0.4 mg by mouth at bedtime.     [provider]  Testosterone 30 MG/ACT SOLN Apply 30 mg topically daily. Apply under each arm 10/18/19   [provider]  urea (CARMOL) 20 % cream Apply 1 application topically daily as needed (Feet pain).     [provider]    Allergies    Patient has no known allergies.  Review of Systems   Review of Systems  Constitutional:  Negative for chills, fatigue and fever.  HENT:  Negative for congestion, sore throat and trouble swallowing.   Respiratory:  Positive for cough. Negative for shortness of breath and wheezing.   Cardiovascular:  Negative for chest pain and palpitations.  Gastrointestinal:  Negative for abdominal pain, diarrhea, nausea and vomiting.  Genitourinary:  Negative for dysuria and flank pain.  Musculoskeletal:  Negative for arthralgias, back pain, myalgias, neck pain and neck stiffness.  Skin:  Negative for rash.  Neurological:  Negative for  dizziness, weakness, numbness and headaches.  Hematological:  Does not bruise/bleed easily.   Physical Exam Updated Vital Signs BP 129/70   Pulse 80   Temp 99.3 F (37.4 C) (Oral)   Resp 20   Ht _0  (1.753 m)   Wt 93.9 kg   SpO2 98%   BMI 30.57 kg/m   Physical Exam Constitutional:      Appearance: Normal appearance. He is not ill-appearing or toxic-appearing.  HENT:     Head: Normocephalic.     Mouth/Throat:     Mouth: Mucous membranes are moist.  Neck:     Trachea: Phonation normal.  Cardiovascular:     Rate and Rhythm: Normal rate and regular rhythm.     Pulses: Normal pulses.  Pulmonary:     Effort: Pulmonary effort is normal. No respiratory distress.     Breath sounds: Normal breath sounds. No wheezing.  Abdominal:     Palpations: Abdomen is soft.     Tenderness: There is no abdominal tenderness. There is no guarding or rebound.  Musculoskeletal:        General: Normal range of motion.     Cervical back: Normal range of motion.  Skin:    General: Skin is warm.     Capillary Refill: Capillary refill takes less than 2 seconds.     Findings: No rash.  Neurological:     Mental Status: He is alert and oriented to person, place, and time.     Sensory: No sensory deficit.     Motor: No weakness.    ED Results /  Procedures / Treatments   Labs (all labs ordered are listed, but only abnormal results are displayed) Labs Reviewed - No data to display  EKG None  Radiology No results found.  Procedures Procedures   Medications Ordered in ED Medications - No data to display  ED Course  I have reviewed the triage vital signs and the nursing notes.  Pertinent labs & imaging results that were available during my care of the patient were reviewed by me and considered in my medical decision making (see chart for details).    MDM Rules/Calculators/A&P                           Patient here requesting evaluation and possible treatment with antiviral medication after positive COVID home test earlier today.  Notes family members are also positive for COVID.  Patient has been fully vaccinated and boosted x1.  He reports occasional productive cough but no chest pain or shortness of breath.  No known fever at home.  No vomiting or diarrhea.  Vital signs reassuring.  No hypoxia tachycardia or tachypnea.  Patient requesting prescription for Paxlovid.  He does currently take Cialis and atorvastatin.  He has history of asthma.  Does not currently use oxygen and has albuterol inhaler at home.  Given patient's risk factors, I do think that treatment with antiviral medication is warranted, will treat with molnupiravir.  He will continue albuterol use as needed.  Return precautions were discussed.  Patient appears appropriate for discharge home and agrees to plan.   Final Clinical Impression(s) / ED Diagnoses Final diagnoses:  COVID    Rx / DC Orders ED Discharge Orders          Ordered    molnupiravir EUA 200 mg CAPS  2 times daily        10/29/20 1725  Kem Parkinson, PA-C 10/30/20 2147    Milton Ferguson, MD 11/05/20 [phone]

## 2020-11-23 ENCOUNTER — Ambulatory Visit: Payer: Medicare Other | Admitting: Internal Medicine

## 2020-11-23 ENCOUNTER — Other Ambulatory Visit: Payer: Self-pay

## 2020-11-23 ENCOUNTER — Ambulatory Visit: Payer: Medicare Other

## 2020-11-23 ENCOUNTER — Encounter: Payer: Self-pay | Admitting: Internal Medicine

## 2020-11-23 ENCOUNTER — Ambulatory Visit (INDEPENDENT_AMBULATORY_CARE_PROVIDER_SITE_OTHER): Payer: Medicare Other

## 2020-11-23 ENCOUNTER — Ambulatory Visit: Payer: TRICARE For Life (TFL) | Admitting: Internal Medicine

## 2020-11-23 VITALS — BP 130/60 | HR 83 | Temp 98.4°F | Ht 68.0 in | Wt 213.2 lb

## 2020-11-23 DIAGNOSIS — Z0000 Encounter for general adult medical examination without abnormal findings: Secondary | ICD-10-CM | POA: Diagnosis not present

## 2020-11-23 DIAGNOSIS — E291 Testicular hypofunction: Secondary | ICD-10-CM

## 2020-11-23 DIAGNOSIS — E6609 Other obesity due to excess calories: Secondary | ICD-10-CM

## 2020-11-23 DIAGNOSIS — Z23 Encounter for immunization: Secondary | ICD-10-CM

## 2020-11-23 DIAGNOSIS — Z79899 Other long term (current) drug therapy: Secondary | ICD-10-CM

## 2020-11-23 DIAGNOSIS — R7309 Other abnormal glucose: Secondary | ICD-10-CM

## 2020-11-23 DIAGNOSIS — E7800 Pure hypercholesterolemia, unspecified: Secondary | ICD-10-CM

## 2020-11-23 NOTE — Patient Instructions (Signed)
Andrew Fisher , Thank you for taking time to come for your Medicare Wellness Visit. I appreciate your ongoing commitment to your health goals. Please review the following plan we discussed and let me know if I can assist you in the future.   Screening recommendations/referrals: Colonoscopy: completed 08/18/2018 Recommended yearly ophthalmology/optometry visit for glaucoma screening and checkup Recommended yearly dental visit for hygiene and checkup Vaccinations: Influenza vaccine: today Pneumococcal vaccine: completed 01/13/2020 Tdap vaccine: completed 12/13/2019 Shingles vaccine: discussed   Covid-19: 12/25/2019, 06/12/2019, 05/21/2019  Advanced directives: Advance directive discussed with you today. Even though you declined this today please call our office should you change your mind and we can give you the proper paperwork for you to fill out.  Conditions/risks identified: none  Next appointment: Follow up in one year for your annual wellness visit.   Preventive Care 35 Years and Older, Male Preventive care refers to lifestyle choices and visits with your health care provider that can promote health and wellness. What does preventive care include? A yearly physical exam. This is also called an annual well check. Dental exams once or twice a year. Routine eye exams. Ask your health care provider how often you should have your eyes checked. Personal lifestyle choices, including: Daily care of your teeth and gums. Regular physical activity. Eating a healthy diet. Avoiding tobacco and drug use. Limiting alcohol use. Practicing safe sex. Taking low doses of aspirin every day. Taking vitamin and mineral supplements as recommended by your health care provider. What happens during an annual well check? The services and screenings done by your health care provider during your annual well check will depend on your age, overall health, lifestyle risk factors, and family history of  disease. Counseling  Your health care provider may ask you questions about your: Alcohol use. Tobacco use. Drug use. Emotional well-being. Home and relationship well-being. Sexual activity. Eating habits. History of falls. Memory and ability to understand (cognition). Work and work Astronomer. Screening  You may have the following tests or measurements: Height, weight, and BMI. Blood pressure. Lipid and cholesterol levels. These may be checked every 5 years, or more frequently if you are over 60 years old. Skin check. Lung cancer screening. You may have this screening every year starting at age 62 if you have a 30-pack-year history of smoking and currently smoke or have quit within the past 15 years. Fecal occult blood test (FOBT) of the stool. You may have this test every year starting at age 24. Flexible sigmoidoscopy or colonoscopy. You may have a sigmoidoscopy every 5 years or a colonoscopy every 10 years starting at age 19. Prostate cancer screening. Recommendations will vary depending on your family history and other risks. Hepatitis C blood test. Hepatitis B blood test. Sexually transmitted disease (STD) testing. Diabetes screening. This is done by checking your blood sugar (glucose) after you have not eaten for a while (fasting). You may have this done every 1-3 years. Abdominal aortic aneurysm (AAA) screening. You may need this if you are a current or former smoker. Osteoporosis. You may be screened starting at age 37 if you are at high risk. Talk with your health care provider about your test results, treatment options, and if necessary, the need for more tests. Vaccines  Your health care provider may recommend certain vaccines, such as: Influenza vaccine. This is recommended every year. Tetanus, diphtheria, and acellular pertussis (Tdap, Td) vaccine. You may need a Td booster every 10 years. Zoster vaccine. You may need this after  age 55. Pneumococcal 13-valent  conjugate (PCV13) vaccine. One dose is recommended after age 76. Pneumococcal polysaccharide (PPSV23) vaccine. One dose is recommended after age 63. Talk to your health care provider about which screenings and vaccines you need and how often you need them. This information is not intended to replace advice given to you by your health care provider. Make sure you discuss any questions you have with your health care provider. Document Released: 04/07/2015 Document Revised: 11/29/2015 Document Reviewed: 01/10/2015 Elsevier Interactive Patient Education  2017 [place] Prevention in the Home Falls can cause injuries. They can happen to people of all ages. There are many things you can do to make your home safe and to help prevent falls. What can I do on the outside of my home? Regularly fix the edges of walkways and driveways and fix any cracks. Remove anything that might make you trip as you walk through a door, such as a raised step or threshold. Trim any bushes or trees on the path to your home. Use bright outdoor lighting. Clear any walking paths of anything that might make someone trip, such as rocks or tools. Regularly check to see if handrails are loose or broken. Make sure that both sides of any steps have handrails. Any raised decks and porches should have guardrails on the edges. Have any leaves, snow, or ice cleared regularly. Use sand or salt on walking paths during winter. Clean up any spills in your garage right away. This includes oil or grease spills. What can I do in the bathroom? Use night lights. Install grab bars by the toilet and in the tub and shower. Do not use towel bars as grab bars. Use non-skid mats or decals in the tub or shower. If you need to sit down in the shower, use a plastic, non-slip stool. Keep the floor dry. Clean up any water that spills on the floor as soon as it happens. Remove soap buildup in the tub or shower regularly. Attach bath mats  securely with double-sided non-slip rug tape. Do not have throw rugs and other things on the floor that can make you trip. What can I do in the bedroom? Use night lights. Make sure that you have a light by your bed that is easy to reach. Do not use any sheets or blankets that are too big for your bed. They should not hang down onto the floor. Have a firm chair that has side arms. You can use this for support while you get dressed. Do not have throw rugs and other things on the floor that can make you trip. What can I do in the kitchen? Clean up any spills right away. Avoid walking on wet floors. Keep items that you use a lot in easy-to-reach places. If you need to reach something above you, use a strong step stool that has a grab bar. Keep electrical cords out of the way. Do not use floor polish or wax that makes floors slippery. If you must use wax, use non-skid floor wax. Do not have throw rugs and other things on the floor that can make you trip. What can I do with my stairs? Do not leave any items on the stairs. Make sure that there are handrails on both sides of the stairs and use them. Fix handrails that are broken or loose. Make sure that handrails are as long as the stairways. Check any carpeting to make sure that it is firmly attached to the stairs.  Fix any carpet that is loose or worn. Avoid having throw rugs at the top or bottom of the stairs. If you do have throw rugs, attach them to the floor with carpet tape. Make sure that you have a light switch at the top of the stairs and the bottom of the stairs. If you do not have them, ask someone to add them for you. What else can I do to help prevent falls? Wear shoes that: Do not have high heels. Have rubber bottoms. Are comfortable and fit you well. Are closed at the toe. Do not wear sandals. If you use a stepladder: Make sure that it is fully opened. Do not climb a closed stepladder. Make sure that both sides of the stepladder  are locked into place. Ask someone to hold it for you, if possible. Clearly mark and make sure that you can see: Any grab bars or handrails. First and last steps. Where the edge of each step is. Use tools that help you move around (mobility aids) if they are needed. These include: Canes. Walkers. Scooters. Crutches. Turn on the lights when you go into a dark area. Replace any light bulbs as soon as they burn out. Set up your furniture so you have a clear path. Avoid moving your furniture around. If any of your floors are uneven, fix them. If there are any pets around you, be aware of where they are. Review your medicines with your doctor. Some medicines can make you feel dizzy. This can increase your chance of falling. Ask your doctor what other things that you can do to help prevent falls. This information is not intended to replace advice given to you by your health care provider. Make sure you discuss any questions you have with your health care provider. Document Released: 01/05/2009 Document Revised: 08/17/2015 Document Reviewed: 04/15/2014 Elsevier Interactive Patient Education  2017 [employer].

## 2020-11-23 NOTE — Progress Notes (Signed)
This visit occurred during the SARS-CoV-2 public health emergency.  Safety protocols were in place, including screening questions prior to the visit, additional usage of staff PPE, and extensive cleaning of exam room while observing appropriate contact time as indicated for disinfecting solutions.  Subjective:   Andrew Fisher is a 65 y.o. male who presents for an Initial Medicare Annual Wellness Visit.  Review of Systems     Cardiac Risk Factors include: advanced age (>39mn, >>51women);male gender;obesity (BMI >30kg/m2);sedentary lifestyle     Objective:    Today's Vitals   11/23/20 1406  BP: 130/60  Pulse: 83  Temp: 98.4 F (36.9 C)  TempSrc: Oral  SpO2: 95%  Weight: 213 lb 3.2 oz (96.7 kg)  Height: 5' 8"  (1.727 m)   Body mass index is 32.42 kg/m.  Advanced Directives 11/23/2020 10/29/2020 11/17/2019 11/17/2019 11/12/2019 11/07/2019 01/11/2017  Does Patient Have a Medical Advance Directive? No No No No No No No  Would patient like information on creating a medical advance directive? - - Yes (MAU/Ambulatory/Procedural Areas - Information given) Yes (ED - Information included in AVS) No - Patient declined - No - Patient declined    Current Medications (verified) Outpatient Encounter Medications as of 11/23/2020  Medication Sig   albuterol (VENTOLIN HFA) 108 (90 Base) MCG/ACT inhaler USE 1 TO 2 INHALATIONS EVERY 6 HOURS AS NEEDED FOR WHEEZING OR SHORTNESS OF BREATH (Patient taking differently: Inhale 2 puffs into the lungs every 6 (six) hours as needed for wheezing or shortness of breath.)   aspirin EC 81 MG tablet Take 81 mg by mouth daily.   atorvastatin (LIPITOR) 10 MG tablet TAKE 1 TABLET DAILY (Patient taking differently: Take 10 mg by mouth daily.)   BELBUCA 300 MCG FILM Take 300 mcg by mouth every 12 (twelve) hours.    Blood Glucose Monitoring Suppl KIT Use as directed to check blood sugars 1 time per day dx:e11.22   EPINEPHrine 0.3 mg/0.3 mL IJ SOAJ injection Inject 0.3 mLs  (0.3 mg total) into the muscle as needed for anaphylaxis. (Patient taking differently: Inject 0.3 mg into the muscle daily as needed for anaphylaxis.)   gabapentin (NEURONTIN) 300 MG capsule Take 300 mg by mouth 4 (four) times daily.   glucose blood test strip Use as directed to check blood sugars 1 time per day dx:e11.22   Lancets 33G MISC Use as directed to check blood sugars 1 time per day dx:e11.22   lansoprazole (PREVACID) 30 MG capsule Take 30 mg by mouth daily.    LINZESS 145 MCG CAPS capsule Take 145 mcg by mouth daily as needed (constipation).    loratadine (CLARITIN) 10 MG tablet Take 10 mg by mouth daily.   montelukast (SINGULAIR) 10 MG tablet TAKE 1 TABLET DAILY (Patient taking differently: Take 10 mg by mouth daily.)   Multiple Vitamins-Minerals (MULTIVITAMIN PO) Take 1 tablet by mouth daily.   tadalafil (CIALIS) 5 MG tablet Take 5 mg by mouth daily.    tamsulosin (FLOMAX) 0.4 MG CAPS capsule Take 0.4 mg by mouth at bedtime.    Testosterone 30 MG/ACT SOLN Apply 30 mg topically daily. Apply under each arm   urea (CARMOL) 20 % cream Apply 1 application topically daily as needed (Feet pain).    magnesium oxide (MAG-OX) 400 MG tablet Take 400 mg by mouth daily. (Patient not taking: Reported on 11/23/2020)   neomycin-polymyxin-hydrocortisone (CORTISPORIN) 3.5-10000-1 OTIC suspension Place 4 drops into both ears 4 (four) times daily. X 7 days (Patient not taking: Reported on  11/23/2020)   No facility-administered encounter medications on file as of 11/23/2020.    Allergies (verified) Patient has no known allergies.   History: Past Medical History:  Diagnosis Date   Arthritis    Asthma    GERD (gastroesophageal reflux disease)    Hearing loss    Hemorrhoids    Hyperlipidemia    Low testosterone in male    Peripheral neuropathy    Sleep apnea    wears cpap   Sore throat    Wears glasses    Wears hearing aid in both ears    Past Surgical History:  Procedure Laterality Date    ANTERIOR CERVICAL DECOMP/DISCECTOMY FUSION N/A 01/09/2017   Procedure: ANTERIOR CERVICAL DECOMPRESSION FUSION, CERVICAL FOUR-FIVE, CERVICAL FIVE-SIX INSTRUMENTATION AND ALLOGRAFT;  Surgeon: Phylliss Bob, MD;  Location: [facility];  Service: Orthopedics;  Laterality: N/A;  ANTERIOR CERVICAL DECOMPRESSION FUSION, CERVICAL FOUR-FIVE, CERVICAL FIVE-SIX INSTRUMENTATION AND ALLOGRAFT   APPENDECTOMY     BIOPSY  11/12/2019   Procedure: BIOPSY;  Surgeon: Carol Ada, MD;  Location: [facility];  Service: Endoscopy;;   COLONOSCOPY     ESOPHAGOGASTRODUODENOSCOPY (EGD) WITH PROPOFOL N/A 11/12/2019   Procedure: ESOPHAGOGASTRODUODENOSCOPY (EGD) WITH PROPOFOL;  Surgeon: Carol Ada, MD;  Location: [facility];  Service: Endoscopy;  Laterality: N/A;   NECK SURGERY     SAVORY DILATION N/A 11/12/2019   Procedure: SAVORY DILATION;  Surgeon: Carol Ada, MD;  Location: [facility];  Service: Endoscopy;  Laterality: N/A;   SHOULDER SURGERY  2005   rotator cuff and tendons repaired   Family History  Problem Relation Age of Onset   Diabetes Mother    Hypertension Mother    Healthy Father    Cancer Other    Social History   Socioeconomic History   Marital status: Married    Spouse name: Not on file   Number of children: Not on file   Years of education: Not on file   Highest education level: Not on file  Occupational History   Not on file  Tobacco Use   Smoking status: Former    Packs/day: 0.25    Years: 6.00    Pack years: 1.50    Types: Cigarettes   Smokeless tobacco: Never  Vaping Use   Vaping Use: Never used  Substance and Sexual Activity   Alcohol use: Yes    Comment: 5 times a week   Drug use: No   Sexual activity: Not on file  Other Topics Concern   Not on file  Social History Narrative   Not on file   Social Determinants of Health   Financial Resource Strain: Low Risk    Difficulty of Paying Living Expenses: Not hard at all  Food Insecurity: No Food Insecurity   Worried  About Charity fundraiser in the Last Year: Never true   [place] in the Last Year: Never true  Transportation Needs: No Transportation Needs   Lack of Transportation (Medical): No   Lack of Transportation (Non-Medical): No  Physical Activity: Inactive   Days of Exercise per Week: 0 days   Minutes of Exercise per Session: 0 min  Stress: Stress Concern Present   Feeling of Stress : To some extent  Social Connections: Not on file    Tobacco Counseling Counseling given: Not Answered   Clinical Intake:  Pre-visit preparation completed: Yes  Pain : No/denies pain     Nutritional Status: BMI > 30  Obese Nutritional Risks: None Diabetes: No  How often  do you need to have someone help you when you read instructions, pamphlets, or other written materials from your doctor or pharmacy?: 1 - Never What is the last grade level you completed in school?: 17 yrs  Diabetic? no  Interpreter Needed?: No  Information entered by :: NAllen LPN   Activities of Daily Living In your present state of health, do you have any difficulty performing the following activities: 11/23/2020  Hearing? Y  Comment hearing aides  Vision? N  Difficulty concentrating or making decisions? N  Walking or climbing stairs? N  Dressing or bathing? N  Doing errands, shopping? N  Preparing Food and eating ? N  Using the Toilet? N  In the past six months, have you accidently leaked urine? N  Do you have problems with loss of bowel control? N  Managing your Medications? N  Managing your Finances? N  Housekeeping or managing your Housekeeping? N  Some recent data might be hidden    Patient Care Team: Harl Bowie, MD as PCP - General (Family Medicine)  Indicate any recent Medical Services you may have received from other than [facility] providers in the past year (date may be approximate).     Assessment:   This is a routine wellness examination for [place].  Hearing/Vision screen Vision Screening  - Comments:: [facility]  Dietary issues and exercise activities discussed: Current Exercise Habits: The patient does not participate in regular exercise at present   Goals Addressed             This Visit's Progress    Patient Stated       11/23/2020, wants to start exercising and lose weight       Depression Screen PHQ 2/9 Scores 11/23/2020 05/19/2019 05/04/2018 03/26/2018 01/30/2018  PHQ - 2 Score 0 0 0 0 0    Fall Risk Fall Risk  11/23/2020 11/17/2019 11/16/2018 05/04/2018 01/30/2018  Falls in the past year? 0 0 0 0 0  Number falls in past yr: - 0 - - -  Risk for fall due to : Medication side effect - - - -  Follow up Falls evaluation completed;Education provided;Falls prevention discussed - - - -    FALL RISK PREVENTION PERTAINING TO THE HOME:  Any stairs in or around the home? No  If so, are there any without handrails?  N/a Home free of loose throw rugs in walkways, pet beds, electrical cords, etc? Yes  Adequate lighting in your home to reduce risk of falls? Yes   ASSISTIVE DEVICES UTILIZED TO PREVENT FALLS:  Life alert? No  Use of a cane, walker or w/c? No  Grab bars in the bathroom? No  Shower chair or bench in shower? Yes  Elevated toilet seat or a handicapped toilet? No   TIMED UP AND GO:  Was the test performed? No .     Gait steady and fast without use of assistive device  Cognitive Function:     6CIT Screen 11/23/2020 11/17/2019  What Year? 0 points 0 points  What month? 0 points 0 points  What time? 0 points 0 points  Count back from 20 0 points 0 points  Months in reverse 0 points 0 points  Repeat phrase 0 points 0 points  Total Score 0 -    Immunizations Immunization History  Administered Date(s) Administered   Fluad Quad(high Dose 65+) 11/23/2020   Influenza, High Dose Seasonal PF 12/13/2019   Influenza,inj,Quad PF,6+ Mos 11/16/2018   PFIZER(Purple Top)SARS-COV-2 Vaccination 05/21/2019,  06/12/2019    TDAP status: Up to date  Flu  Vaccine status: Completed at today's visit  Pneumococcal vaccine status: Up to date  Covid-19 vaccine status: Completed vaccines  Qualifies for Shingles Vaccine? Yes   Zostavax completed No   Shingrix Completed?: Yes  Screening Tests Health Maintenance  Topic Date Due   COVID-19 Vaccine (4 - Booster for Pfizer series) 03/26/2020   PNA vac Low Risk Adult (2 of 2 - PCV13) 01/12/2021   COLONOSCOPY (Pts 45-49yr Insurance coverage will need to be confirmed)  08/17/2028   TETANUS/TDAP  12/12/2029   INFLUENZA VACCINE  Completed   Hepatitis C Screening  Completed   Zoster Vaccines- Shingrix  Completed   HPV VACCINES  Aged Out    Health Maintenance  Health Maintenance Due  Topic Date Due   COVID-19 Vaccine (4 - Booster for PGliddenseries) 03/26/2020    Colorectal cancer screening: Type of screening: Colonoscopy. Completed 08/18/2018. Repeat every 10 years  Lung Cancer Screening: (Low Dose CT Chest recommended if Age 66-80years, 30 pack-year currently smoking OR have quit w/in 15years.) does not qualify.   Lung Cancer Screening Referral: no  Additional Screening:  Hepatitis C Screening: does qualify; Completed 04/15/2012  Vision Screening: Recommended annual ophthalmology exams for early detection of glaucoma and other disorders of the eye. Is the patient up to date with their annual eye exam?  Yes  Who is the provider or what is the name of the office in which the patient attends annual eye exams? [facility] If pt is not established with a provider, would they like to be referred to a provider to establish care? No .   Dental Screening: Recommended annual dental exams for proper oral hygiene  Community Resource Referral / Chronic Care Management: CRR required this visit?  No   CCM required this visit?  No      Plan:     I have personally reviewed and noted the following in the patient's chart:   Medical and social history Use of alcohol, tobacco or illicit drugs  Current  medications and supplements including opioid prescriptions. Patient is currently taking opioid prescriptions. Information provided to patient regarding non-opioid alternatives. Patient advised to discuss non-opioid treatment plan with their provider. Functional ability and status Nutritional status Physical activity Advanced directives List of other physicians Hospitalizations, surgeries, and ER visits in previous 12 months Vitals Screenings to include cognitive, depression, and falls Referrals and appointments  In addition, I have reviewed and discussed with patient certain preventive protocols, quality metrics, and best practice recommendations. A written personalized care plan for preventive services as well as general preventive health recommendations were provided to patient.     NKellie Simmering LPN   [id number]  Nurse Notes:

## 2020-11-23 NOTE — Progress Notes (Signed)
I,Yamilka [employer] as a Education administrator for [name], MD.,have documented all relevant documentation on the behalf of [name], MD,as directed by  [name], MD while in the presence of [name], MD.  This visit occurred during the SARS-CoV-2 public health emergency.  Safety protocols were in place, including screening questions prior to the visit, additional usage of staff PPE, and extensive cleaning of exam room while observing appropriate contact time as indicated for disinfecting solutions.  Subjective:     Patient ID: Andrew Fisher , male    DOB: [birth year] , 65 y.o.   MRN: [id number]   Chief Complaint  Patient presents with   Hyperlipidemia    HPI  Patient presents today for a 6 month f/u on his cholesterol and abnormal glucose. He is also scheduled to see [facility] Advisor for AWV today.   Hyperlipidemia This is a chronic problem. The problem is controlled. Exacerbating diseases include obesity. Current antihyperlipidemic treatment includes statins. The current treatment provides moderate improvement of lipids. Risk factors for coronary artery disease include dyslipidemia and male sex.    Past Medical History:  Diagnosis Date   Arthritis    Asthma    GERD (gastroesophageal reflux disease)    Hearing loss    Hemorrhoids    Hyperlipidemia    Low testosterone in male    Peripheral neuropathy    Sleep apnea    wears cpap   Sore throat    Wears glasses    Wears hearing aid in both ears      Family History  Problem Relation Age of Onset   Diabetes Mother    Hypertension Mother    Healthy Father    Cancer Other      Current Outpatient Medications:    albuterol (VENTOLIN HFA) 108 (90 Base) MCG/ACT inhaler, USE 1 TO 2 INHALATIONS EVERY 6 HOURS AS NEEDED FOR WHEEZING OR SHORTNESS OF BREATH (Patient taking differently: Inhale 2 puffs into the lungs every 6 (six) hours as needed for wheezing or shortness of breath.), Disp: 17 g, Rfl: 4   aspirin  EC 81 MG tablet, Take 81 mg by mouth daily., Disp: , Rfl:    atorvastatin (LIPITOR) 10 MG tablet, TAKE 1 TABLET DAILY (Patient taking differently: Take 10 mg by mouth daily.), Disp: 90 tablet, Rfl: 3   BELBUCA 300 MCG FILM, Take 300 mcg by mouth every 12 (twelve) hours. , Disp: , Rfl:    Blood Glucose Monitoring Suppl KIT, Use as directed to check blood sugars 1 time per day dx:e11.22, Disp: 1 kit, Rfl: 1   EPINEPHrine 0.3 mg/0.3 mL IJ SOAJ injection, Inject 0.3 mLs (0.3 mg total) into the muscle as needed for anaphylaxis. (Patient taking differently: Inject 0.3 mg into the muscle daily as needed for anaphylaxis.), Disp: 1 each, Rfl: 2   gabapentin (NEURONTIN) 300 MG capsule, Take 300 mg by mouth 4 (four) times daily., Disp: , Rfl:    glucose blood test strip, Use as directed to check blood sugars 1 time per day dx:e11.22, Disp: 150 each, Rfl: 2   Lancets 33G MISC, Use as directed to check blood sugars 1 time per day dx:e11.22, Disp: 150 each, Rfl: 2   lansoprazole (PREVACID) 30 MG capsule, Take 30 mg by mouth daily. , Disp: , Rfl:    LINZESS 145 MCG CAPS capsule, Take 145 mcg by mouth daily as needed (constipation). , Disp: , Rfl:    loratadine (CLARITIN) 10 MG tablet, Take 10 mg  by mouth daily., Disp: , Rfl:    magnesium oxide (MAG-OX) 400 MG tablet, Take 400 mg by mouth daily. (Patient not taking: Reported on 11/23/2020), Disp: , Rfl:    montelukast (SINGULAIR) 10 MG tablet, TAKE 1 TABLET DAILY (Patient taking differently: Take 10 mg by mouth daily.), Disp: 90 tablet, Rfl: 1   Multiple Vitamins-Minerals (MULTIVITAMIN PO), Take 1 tablet by mouth daily., Disp: , Rfl:    neomycin-polymyxin-hydrocortisone (CORTISPORIN) 3.5-10000-1 OTIC suspension, Place 4 drops into both ears 4 (four) times daily. X 7 days (Patient not taking: Reported on 11/23/2020), Disp: 10 mL, Rfl: 0   tadalafil (CIALIS) 5 MG tablet, Take 5 mg by mouth daily. , Disp: , Rfl:    tamsulosin (FLOMAX) 0.4 MG CAPS capsule, Take 0.4 mg by  mouth at bedtime. , Disp: , Rfl:    Testosterone 30 MG/ACT SOLN, Apply 30 mg topically daily. Apply under each arm, Disp: , Rfl:    urea (CARMOL) 20 % cream, Apply 1 application topically daily as needed (Feet pain). , Disp: , Rfl:    No Known Allergies   Review of Systems  Constitutional: Negative.   Respiratory: Negative.    Cardiovascular: Negative.   Gastrointestinal: Negative.   Neurological: Negative.   Psychiatric/Behavioral: Negative.      Today's Vitals   11/23/20 1425  BP: 130/60  Pulse: 83  Temp: 98.4 F (36.9 C)  Weight: 213 lb 3 oz (96.7 kg)  Height: _0  (1.727 m)   Body mass index is 32.41 kg/m.  Wt Readings from Last 3 Encounters:  11/23/20 213 lb 3 oz (96.7 kg)  11/23/20 213 lb 3.2 oz (96.7 kg)  10/29/20 207 lb (93.9 kg)     Objective:  Physical Exam Vitals and nursing note reviewed.  Constitutional:      Appearance: Normal appearance.  HENT:     Head: Normocephalic and atraumatic.     Nose:     Comments: Masked     Mouth/Throat:     Comments: Masked  Eyes:     Extraocular Movements: Extraocular movements intact.  Cardiovascular:     Rate and Rhythm: Normal rate and regular rhythm.     Heart sounds: Normal heart sounds.  Pulmonary:     Effort: Pulmonary effort is normal.     Breath sounds: Normal breath sounds.  Musculoskeletal:     Cervical back: Normal range of motion.  Skin:    General: Skin is warm.  Neurological:     General: No focal deficit present.     Mental Status: He is alert.  Psychiatric:        Mood and Affect: Mood normal.        Assessment And Plan:     1. Pure hypercholesterolemia Comments: I will check non-fasting lipid panel.  He is currently taking atorvastatin 46m daily. He is encouraged to follow heart healthy diet.  - Lipid panel - CBC with Diff  2. Other abnormal glucose Comments: His a1c has been elevated in the past. I will recheck this today. He is encouraged to limit his intake of sweetened beverages,  including diet drinks.  - Hemoglobin A1c - CMP14+EGFR  3. Hypogonadism in male Comments: I will check free/total testosterone levels. I will forward this to his Urologist.  - Testosterone,Free and Total  4. Class 1 obesity due to excess calories with serious comorbidity and body mass index (BMI) of 32.0 to 32.9 in adult Comments: He is encouraged to strive for BMI less than 30 to  decrease cardiac risk. Advised to aim for at least 150 minutes of exercise per week.   5. Polypharmacy The [employer] pharmacogenomics process and benefits of testing was discussed with the patient. After the discussion, the patient made an informed consent to testing and the sample was collected and mailed to the external lab. - [place]   Patient was given opportunity to ask questions. Patient verbalized understanding of the plan and was able to repeat key elements of the plan. All questions were answered to their satisfaction.   I, [name], MD, have reviewed all documentation for this visit. The documentation on 12/10/20 for the exam, diagnosis, procedures, and orders are all accurate and complete.   IF YOU HAVE BEEN REFERRED TO A SPECIALIST, IT MAY TAKE 1-2 WEEKS TO SCHEDULE/PROCESS THE REFERRAL. IF YOU HAVE NOT HEARD FROM US/SPECIALIST IN TWO WEEKS, PLEASE GIVE [place] A CALL AT [phone] X [phone].   THE PATIENT IS ENCOURAGED TO PRACTICE SOCIAL DISTANCING DUE TO THE COVID-19 PANDEMIC.

## 2020-11-23 NOTE — Patient Instructions (Signed)

## 2020-11-28 LAB — CMP14+EGFR
ALT: 18 IU/L (ref 0–44)
AST: 20 IU/L (ref 0–40)
Albumin/Globulin Ratio: 1.6 (ref 1.2–2.2)
Albumin: 4.2 g/dL (ref 3.8–4.8)
Alkaline Phosphatase: 81 IU/L (ref 44–121)
BUN/Creatinine Ratio: 12 (ref 10–24)
BUN: 14 mg/dL (ref 8–27)
Bilirubin Total: 0.4 mg/dL (ref 0.0–1.2)
CO2: 24 mmol/L (ref 20–29)
Calcium: 9 mg/dL (ref 8.6–10.2)
Chloride: 101 mmol/L (ref 96–106)
Creatinine, Ser: 1.18 mg/dL (ref 0.76–1.27)
Globulin, Total: 2.6 g/dL (ref 1.5–4.5)
Glucose: 89 mg/dL (ref 65–99)
Potassium: 4.3 mmol/L (ref 3.5–5.2)
Sodium: 136 mmol/L (ref 134–144)
Total Protein: 6.8 g/dL (ref 6.0–8.5)
eGFR: 68 mL/min/{1.73_m2} (ref >59)

## 2020-11-28 LAB — LIPID PANEL
Chol/HDL Ratio: 3.7 ratio (ref 0.0–5.0)
Cholesterol, Total: 122 mg/dL (ref 100–199)
HDL: 33 mg/dL — ABNORMAL LOW (ref >39)
LDL Chol Calc (NIH): 68 mg/dL (ref 0–99)
Triglycerides: 114 mg/dL (ref 0–149)
VLDL Cholesterol Cal: 21 mg/dL (ref 5–40)

## 2020-11-28 LAB — CBC WITH DIFFERENTIAL/PLATELET
Basophils Absolute: 0 10*3/uL (ref 0.0–0.2)
Basos: 1 %
EOS (ABSOLUTE): 0.3 10*3/uL (ref 0.0–0.4)
Eos: 9 %
Hematocrit: 43.5 % (ref 37.5–51.0)
Hemoglobin: 13.4 g/dL (ref 13.0–17.7)
Immature Grans (Abs): 0 10*3/uL (ref 0.0–0.1)
Immature Granulocytes: 0 %
Lymphocytes Absolute: 1 10*3/uL (ref 0.7–3.1)
Lymphs: 32 %
MCH: 20.8 pg — ABNORMAL LOW (ref 26.6–33.0)
MCHC: 30.8 g/dL — ABNORMAL LOW (ref 31.5–35.7)
MCV: 68 fL — ABNORMAL LOW (ref 79–97)
Monocytes Absolute: 0.3 10*3/uL (ref 0.1–0.9)
Monocytes: 10 %
Neutrophils Absolute: 1.5 10*3/uL (ref 1.4–7.0)
Neutrophils: 48 %
Platelets: 165 10*3/uL (ref 150–450)
RBC: 6.43 x10E6/uL — ABNORMAL HIGH (ref 4.14–5.80)
RDW: 20.2 % — ABNORMAL HIGH (ref 11.6–15.4)
WBC: 3.1 10*3/uL — ABNORMAL LOW (ref 3.4–10.8)

## 2020-11-28 LAB — HEMOGLOBIN A1C
Est. average glucose Bld gHb Est-mCnc: 126 mg/dL
Hgb A1c MFr Bld: 6 % — ABNORMAL HIGH (ref 4.8–5.6)

## 2020-11-28 LAB — TESTOSTERONE,FREE AND TOTAL
Testosterone, Free: 8.2 pg/mL (ref 6.6–18.1)
Testosterone: 528 ng/dL (ref 264–916)

## 2021-01-19 ENCOUNTER — Other Ambulatory Visit: Payer: Self-pay | Admitting: Internal Medicine

## 2021-01-19 DIAGNOSIS — R718 Other abnormality of red blood cells: Secondary | ICD-10-CM

## 2021-01-31 NOTE — Progress Notes (Signed)
[facility] [place] [address] [place] [address]   CLINIC:  Medical Oncology/Hematology  Patient Care Team: Harl Bowie, MD as PCP - General (Family Medicine) Derek Jack, MD as Medical Oncologist (Hematology)  CHIEF COMPLAINTS/PURPOSE OF CONSULTATION:  Evaluation of abnormal RBC indices  HISTORY OF PRESENTING ILLNESS:  Andrew Fisher 65 y.o. male is here because of evaluation of abnormal RBC indices, at the request of Dr. Baird Cancer.  Today he reports feeling well. He reports improved energy since resuming exercising regularly 5 days ago. He denies history of blood transfusions and DVT. He denies dizziness, fevers, night sweats, weight loss, fatigue. He reports occasional SOB due to a history of sleep apnea; he has used a CPAP machine since 2006. He reports occasional light-headedness, and he has a recent diagnosis of PTSD for which he is taking Zoloft. He reports hearing a beating sound after showers, while bending down, and while lying down. He takes vitamin D daily.   Currently he lives at home with his family in [place]. Prior to retirement he was in the [employer] for 23 years. He cannot recall unusual or excessive chemical exposure. He quit smoking in 1979. He reports he and his son were found to have the traits of sickle cell anemia, but they have never shown any symptoms. He denies family history of blood transfusions, thalassemia, and anemia. His mother passed from colon cancer, his maternal aunt passed from ovarian cancer in her late 35s, and his maternal grandmother had a unspecified tumor.   MEDICAL HISTORY:  Past Medical History:  Diagnosis Date   Arthritis    Asthma    GERD (gastroesophageal reflux disease)    Hearing loss    Hemorrhoids    Hyperlipidemia    Low testosterone in male    Peripheral neuropathy    Sleep apnea    wears cpap   Sore throat    Wears glasses    Wears hearing aid in both ears     SURGICAL HISTORY: Past  Surgical History:  Procedure Laterality Date   ANTERIOR CERVICAL DECOMP/DISCECTOMY FUSION N/A 01/09/2017   Procedure: ANTERIOR CERVICAL DECOMPRESSION FUSION, CERVICAL FOUR-FIVE, CERVICAL FIVE-SIX INSTRUMENTATION AND ALLOGRAFT;  Surgeon: Phylliss Bob, MD;  Location: [facility];  Service: Orthopedics;  Laterality: N/A;  ANTERIOR CERVICAL DECOMPRESSION FUSION, CERVICAL FOUR-FIVE, CERVICAL FIVE-SIX INSTRUMENTATION AND ALLOGRAFT   APPENDECTOMY     BIOPSY  11/12/2019   Procedure: BIOPSY;  Surgeon: Carol Ada, MD;  Location: [facility];  Service: Endoscopy;;   COLONOSCOPY     ESOPHAGOGASTRODUODENOSCOPY (EGD) WITH PROPOFOL N/A 11/12/2019   Procedure: ESOPHAGOGASTRODUODENOSCOPY (EGD) WITH PROPOFOL;  Surgeon: Carol Ada, MD;  Location: [facility];  Service: Endoscopy;  Laterality: N/A;   NECK SURGERY     SAVORY DILATION N/A 11/12/2019   Procedure: SAVORY DILATION;  Surgeon: Carol Ada, MD;  Location: [facility];  Service: Endoscopy;  Laterality: N/A;   SHOULDER SURGERY  2005   rotator cuff and tendons repaired    SOCIAL HISTORY: Social History   Socioeconomic History   Marital status: Married    Spouse name: Not on file   Number of children: Not on file   Years of education: Not on file   Highest education level: Not on file  Occupational History   Not on file  Tobacco Use   Smoking status: Former    Packs/day: 0.25    Years: 6.00    Pack years: 1.50    Types: Cigarettes   Smokeless tobacco: Never  Vaping  Use   Vaping Use: Never used  Substance and Sexual Activity   Alcohol use: Not Currently   Drug use: No   Sexual activity: Not on file  Other Topics Concern   Not on file  Social History Narrative   Not on file   Social Determinants of Health   Financial Resource Strain: Low Risk    Difficulty of Paying Living Expenses: Not hard at all  Food Insecurity: No Food Insecurity   Worried About Running Out of Food in the Last Year: Never true   [place] in the Last  Year: Never true  Transportation Needs: No Transportation Needs   Lack of Transportation (Medical): No   Lack of Transportation (Non-Medical): No  Physical Activity: Inactive   Days of Exercise per Week: 0 days   Minutes of Exercise per Session: 0 min  Stress: Stress Concern Present   Feeling of Stress : To some extent  Social Connections: Not on file  Intimate Partner Violence: Not on file    FAMILY HISTORY: Family History  Problem Relation Age of Onset   Diabetes Mother    Hypertension Mother    Healthy Father    Cancer Other     ALLERGIES:  has No Known Allergies.  MEDICATIONS:  Current Outpatient Medications  Medication Sig Dispense Refill   albuterol (VENTOLIN HFA) 108 (90 Base) MCG/ACT inhaler USE 1 TO 2 INHALATIONS EVERY 6 HOURS AS NEEDED FOR WHEEZING OR SHORTNESS OF BREATH (Patient taking differently: Inhale 2 puffs into the lungs every 6 (six) hours as needed for wheezing or shortness of breath.) 17 g 4   aspirin EC 81 MG tablet Take 81 mg by mouth daily.     atorvastatin (LIPITOR) 10 MG tablet TAKE 1 TABLET DAILY (Patient taking differently: Take 10 mg by mouth daily.) 90 tablet 3   Blood Glucose Monitoring Suppl KIT Use as directed to check blood sugars 1 time per day dx:e11.22 1 kit 1   Buprenorphine HCl 450 MCG FILM Take 300 mcg by mouth every 12 (twelve) hours.      EPINEPHrine 0.3 mg/0.3 mL IJ SOAJ injection Inject 0.3 mLs (0.3 mg total) into the muscle as needed for anaphylaxis. (Patient taking differently: Inject 0.3 mg into the muscle daily as needed for anaphylaxis.) 1 each 2   gabapentin (NEURONTIN) 300 MG capsule Take 300 mg by mouth 4 (four) times daily.     glucose blood test strip Use as directed to check blood sugars 1 time per day dx:e11.22 150 each 2   Lancets 33G MISC Use as directed to check blood sugars 1 time per day dx:e11.22 150 each 2   lansoprazole (PREVACID) 30 MG capsule Take 30 mg by mouth daily.      LINZESS 145 MCG CAPS capsule Take 145 mcg  by mouth daily as needed (constipation).      loratadine (CLARITIN) 10 MG tablet Take 10 mg by mouth daily.     magnesium oxide (MAG-OX) 400 MG tablet Take 400 mg by mouth daily.     montelukast (SINGULAIR) 10 MG tablet TAKE 1 TABLET DAILY (Patient taking differently: Take 10 mg by mouth daily.) 90 tablet 1   Multiple Vitamins-Minerals (MULTIVITAMIN PO) Take 1 tablet by mouth daily.     neomycin-polymyxin-hydrocortisone (CORTISPORIN) 3.5-10000-1 OTIC suspension Place 4 drops into both ears 4 (four) times daily. X 7 days 10 mL 0   sertraline (ZOLOFT) 50 MG tablet TAKE ONE-HALF TABLET BY MOUTH IN THE MORNING FOR 7 DAYS,  THEN TAKE ONE TABLET IN THE MORNING FOR MENTAL HEALTH     tadalafil (CIALIS) 5 MG tablet Take 5 mg by mouth daily.      tamsulosin (FLOMAX) 0.4 MG CAPS capsule Take 0.4 mg by mouth at bedtime.      Testosterone 30 MG/ACT SOLN Apply 30 mg topically daily. Apply under each arm     urea (CARMOL) 20 % cream Apply 1 application topically daily as needed (Feet pain).      No current facility-administered medications for this visit.    REVIEW OF SYSTEMS:   Review of Systems  Constitutional:  Negative for appetite change (60%), fatigue (50%), fever and unexpected weight change.  Respiratory:  Positive for shortness of breath.   Cardiovascular:  Positive for palpitations.  Gastrointestinal:  Positive for constipation and nausea.  Neurological:  Positive for light-headedness. Negative for dizziness.  Psychiatric/Behavioral:  Positive for sleep disturbance.   All other systems reviewed and are negative.   PHYSICAL EXAMINATION: ECOG PERFORMANCE STATUS: 1 - Symptomatic but completely ambulatory  Vitals:   02/02/21 1010  BP: (!) 157/88  Pulse: 72  Resp: 16  Temp: 98.5 F (36.9 C)  SpO2: 96%   Filed Weights   02/02/21 1010  Weight: 209 lb (94.8 kg)   Physical Exam Vitals reviewed.  Constitutional:      Appearance: Normal appearance. He is obese.  Cardiovascular:     Rate  and Rhythm: Normal rate and regular rhythm.     Pulses: Normal pulses.     Heart sounds: Normal heart sounds.  Pulmonary:     Effort: Pulmonary effort is normal.     Breath sounds: Normal breath sounds.  Abdominal:     Palpations: Abdomen is soft. There is no hepatomegaly, splenomegaly or mass.     Tenderness: There is no abdominal tenderness.  Musculoskeletal:     Right lower leg: No edema.     Left lower leg: No edema.  Lymphadenopathy:     Cervical: No cervical adenopathy.     Right cervical: No superficial cervical adenopathy.    Left cervical: No superficial cervical adenopathy.     Upper Body:     Right upper body: No supraclavicular adenopathy.     Left upper body: No supraclavicular adenopathy.  Neurological:     General: No focal deficit present.     Mental Status: He is alert and oriented to person, place, and time.  Psychiatric:        Mood and Affect: Mood normal.        Behavior: Behavior normal.     LABORATORY DATA:  I have reviewed the data as listed Recent Results (from the past 2160 hour(s))  Lipid panel     Status: Abnormal   Collection Time: 11/23/20  3:12 PM  Result Value Ref Range   Cholesterol, Total 122 100 - 199 mg/dL   Triglycerides 114 0 - 149 mg/dL   HDL 33 (L) >39 mg/dL   VLDL Cholesterol Cal 21 5 - 40 mg/dL   LDL Chol Calc (NIH) 68 0 - 99 mg/dL   Chol/HDL Ratio 3.7 0.0 - 5.0 ratio    Comment:                                   T. Chol/HDL Ratio  Men  Women                               1/2 Avg.Risk  3.4    3.3                                   Avg.Risk  5.0    4.4                                2X Avg.Risk  9.6    7.1                                3X Avg.Risk 23.4   11.0   Hemoglobin A1c     Status: Abnormal   Collection Time: 11/23/20  3:12 PM  Result Value Ref Range   Hgb A1c MFr Bld 6.0 (H) 4.8 - 5.6 %    Comment:          Prediabetes: 5.7 - 6.4          Diabetes: >6.4          Glycemic  control for adults with diabetes: <7.0    Est. average glucose Bld gHb Est-mCnc 126 mg/dL  CBC with Diff     Status: Abnormal   Collection Time: 11/23/20  3:12 PM  Result Value Ref Range   WBC 3.1 (L) 3.4 - 10.8 x10E3/uL   RBC 6.43 (H) 4.14 - 5.80 x10E6/uL   Hemoglobin 13.4 13.0 - 17.7 g/dL   Hematocrit 43.5 37.5 - 51.0 %   MCV 68 (L) 79 - 97 fL   MCH 20.8 (L) 26.6 - 33.0 pg   MCHC 30.8 (L) 31.5 - 35.7 g/dL   RDW 20.2 (H) 11.6 - 15.4 %   Platelets 165 150 - 450 x10E3/uL   Neutrophils 48 Not Estab. %   Lymphs 32 Not Estab. %   Monocytes 10 Not Estab. %   Eos 9 Not Estab. %   Basos 1 Not Estab. %   Neutrophils Absolute 1.5 1.4 - 7.0 x10E3/uL   Lymphocytes Absolute 1.0 0.7 - 3.1 x10E3/uL   Monocytes Absolute 0.3 0.1 - 0.9 x10E3/uL   EOS (ABSOLUTE) 0.3 0.0 - 0.4 x10E3/uL   Basophils Absolute 0.0 0.0 - 0.2 x10E3/uL   Immature Granulocytes 0 Not Estab. %   Immature Grans (Abs) 0.0 0.0 - 0.1 x10E3/uL  CMP14+EGFR     Status: None   Collection Time: 11/23/20  3:12 PM  Result Value Ref Range   Glucose 89 65 - 99 mg/dL   BUN 14 8 - 27 mg/dL   Creatinine, Ser 1.18 0.76 - 1.27 mg/dL   eGFR 68 >59 mL/min/1.73   BUN/Creatinine Ratio 12 10 - 24   Sodium 136 134 - 144 mmol/L   Potassium 4.3 3.5 - 5.2 mmol/L   Chloride 101 96 - 106 mmol/L   CO2 24 20 - 29 mmol/L   Calcium 9.0 8.6 - 10.2 mg/dL   Total Protein 6.8 6.0 - 8.5 g/dL   Albumin 4.2 3.8 - 4.8 g/dL   Globulin, Total 2.6 1.5 - 4.5 g/dL   Albumin/Globulin Ratio 1.6 1.2 - 2.2   Bilirubin Total 0.4 0.0 - 1.2 mg/dL   Alkaline Phosphatase 81 44 - 121 IU/L  AST 20 0 - 40 IU/L   ALT 18 0 - 44 IU/L  Testosterone,Free and Total     Status: None   Collection Time: 11/23/20  3:12 PM  Result Value Ref Range   Testosterone 528 264 - 916 ng/dL    Comment: Adult male reference interval is based on a population of healthy nonobese males (BMI <30) between 21 and 38 years old. [place], [place] [phone]. PMID: [id number].     Testosterone, Free 8.2 6.6 - 18.1 pg/mL    RADIOGRAPHIC STUDIES: I have personally reviewed the radiological images as listed and agreed with the findings in the report. No results found.  ASSESSMENT:  Abnormal CBC: - Patient seen at the request of Dr. Baird Cancer for abnormal CBC. - CBC on 11/23/2020 showed leukopenia with white count 3.1, elevated RBC count, low MCV, MCH and MCHC. - He denies any fevers, night sweats or weight loss. - Denies any prior history of blood transfusion. - He has sleep apnea and has been using CPAP since 2006. - He reports that his son has sickle cell trait.  Social/family history: - He worked in the [employer] for 23 years and retired as a first Chief Technology Officer.  No clear exposure to chemicals.  Quit smoking in 1979. - Son had HBs trait.  Mother is a Jehovah witness and also had colon cancer.  Maternal aunt died of ovarian cancer in her 72s.   PLAN:  Microcytosis and high RBC count: - He has a normal hemoglobin despite microcytosis and high RBC count. - This is indicative of sickle cell/thalassemia trait. - We will send hemoglobin electrophoresis. - We will also check ferritin, iron panel, E56 and folic acid. - RTC 2 to 3 weeks for follow-up.  2.  Low white count: - He had low white count over several years.  He does not have any recurrent infections. - We will check for nutritional deficiencies.  We will also check SPEP. 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   All questions were answered. The patient knows to call the clinic with any problems, questions or concerns.  Derek Jack, MD 02/02/21 10:59 AM  [place] [phone]   I, Thana Ates, am acting as a scribe for Dr. Derek Jack.  I, Derek Jack MD, have reviewed the above documentation for accuracy and completeness, and I agree with the above.

## 2021-02-02 ENCOUNTER — Encounter (HOSPITAL_COMMUNITY): Payer: Self-pay | Admitting: Hematology

## 2021-02-02 ENCOUNTER — Other Ambulatory Visit: Payer: Self-pay

## 2021-02-02 ENCOUNTER — Inpatient Hospital Stay (HOSPITAL_COMMUNITY): Payer: Medicare Other | Attending: Hematology | Admitting: Hematology

## 2021-02-02 ENCOUNTER — Inpatient Hospital Stay (HOSPITAL_COMMUNITY): Payer: Medicare Other

## 2021-02-02 VITALS — BP 157/88 | HR 72 | Temp 98.5°F | Resp 16 | Ht 68.0 in | Wt 209.0 lb

## 2021-02-02 DIAGNOSIS — Z8041 Family history of malignant neoplasm of ovary: Secondary | ICD-10-CM | POA: Diagnosis not present

## 2021-02-02 DIAGNOSIS — D539 Nutritional anemia, unspecified: Secondary | ICD-10-CM

## 2021-02-02 DIAGNOSIS — Z800 Family history of malignant neoplasm of digestive organs: Secondary | ICD-10-CM | POA: Insufficient documentation

## 2021-02-02 DIAGNOSIS — G4730 Sleep apnea, unspecified: Secondary | ICD-10-CM | POA: Diagnosis not present

## 2021-02-02 DIAGNOSIS — R718 Other abnormality of red blood cells: Secondary | ICD-10-CM | POA: Insufficient documentation

## 2021-02-02 LAB — CBC WITH DIFFERENTIAL/PLATELET
Abs Immature Granulocytes: 0 10*3/uL (ref 0.00–0.07)
Basophils Absolute: 0 10*3/uL (ref 0.0–0.1)
Basophils Relative: 0 %
Eosinophils Absolute: 0.1 10*3/uL (ref 0.0–0.5)
Eosinophils Relative: 5 %
HCT: 42.3 % (ref 39.0–52.0)
Hemoglobin: 13.3 g/dL (ref 13.0–17.0)
Immature Granulocytes: 0 %
Lymphocytes Relative: 26 %
Lymphs Abs: 0.6 10*3/uL — ABNORMAL LOW (ref 0.7–4.0)
MCH: 21.6 pg — ABNORMAL LOW (ref 26.0–34.0)
MCHC: 31.4 g/dL (ref 30.0–36.0)
MCV: 68.7 fL — ABNORMAL LOW (ref 80.0–100.0)
Monocytes Absolute: 0.3 10*3/uL (ref 0.1–1.0)
Monocytes Relative: 11 %
Neutro Abs: 1.5 10*3/uL — ABNORMAL LOW (ref 1.7–7.7)
Neutrophils Relative %: 58 %
Platelets: 150 10*3/uL (ref 150–400)
RBC: 6.16 MIL/uL — ABNORMAL HIGH (ref 4.22–5.81)
RDW: 20.3 % — ABNORMAL HIGH (ref 11.5–15.5)
WBC: 2.5 10*3/uL — ABNORMAL LOW (ref 4.0–10.5)
nRBC: 0 % (ref 0.0–0.2)

## 2021-02-02 LAB — IRON AND TIBC
Iron: 14 ug/dL — ABNORMAL LOW (ref 45–182)
Saturation Ratios: 3 % — ABNORMAL LOW (ref 17.9–39.5)
TIBC: 435 ug/dL (ref 250–450)
UIBC: 421 ug/dL

## 2021-02-02 LAB — RETICULOCYTES
Immature Retic Fract: 21 % — ABNORMAL HIGH (ref 2.3–15.9)
RBC.: 6.27 MIL/uL — ABNORMAL HIGH (ref 4.22–5.81)
Retic Count, Absolute: 67.7 10*3/uL (ref 19.0–186.0)
Retic Ct Pct: 1.1 % (ref 0.4–3.1)

## 2021-02-02 LAB — VITAMIN B12: Vitamin B-12: 365 pg/mL (ref 180–914)

## 2021-02-02 LAB — FOLATE: Folate: 9.6 ng/mL (ref >5.9)

## 2021-02-02 LAB — LACTATE DEHYDROGENASE: LDH: 137 U/L (ref 98–192)

## 2021-02-02 LAB — FERRITIN: Ferritin: 6 ng/mL — ABNORMAL LOW (ref 24–336)

## 2021-02-02 NOTE — Patient Instructions (Addendum)
[facility] at [facility] Discharge Instructions  You were seen and examined today by Dr. Ellin Saba. Dr. Ellin Saba is a hematologist, meaning that he specializes in blood disorders. Dr. Ellin Saba discussed your past medical history, family history of cancers/blood disorders, and the events that led to you being here today.  You were referred to Dr. Ellin Saba due to an abnormality of your red blood cells on your recent blood work. Your red blood cells were elevated, meaning there are too many but the size is smaller than normal. Dr. Ellin Saba has recommended additional lab work today to recheck your red blood cells today and also in an attempt to identify the cause of your red blood cell abnormality.  This is nothing to be concerned about, this is typical of someone with sickle cell trait.  Follow-up as scheduled.   Thank you for choosing [facility] at [facility] to provide your oncology and hematology care.  To afford each patient quality time with our provider, please arrive at least 15 minutes before your scheduled appointment time.   If you have a lab appointment with the Cancer Center please come in thru the Main Entrance and check in at the main information desk.  You need to re-schedule your appointment should you arrive 10 or more minutes late.  We strive to give you quality time with our providers, and arriving late affects you and other patients whose appointments are after yours.  Also, if you no show three or more times for appointments you may be dismissed from the clinic at the providers discretion.     Again, thank you for choosing [facility].  Our hope is that these requests will decrease the amount of time that you wait before being seen by our physicians.       _____________________________________________________________  Should you have questions after your visit to [facility], please contact our  office at [phone] and follow the prompts.  Our office hours are 8:00 a.m. and 4:30 p.m. Monday - Friday.  Please note that voicemails left after 4:00 p.m. may not be returned until the following business day.  We are closed weekends and major holidays.  You do have access to a nurse 24-7, just call the main number to the clinic [phone] and do not press any options, hold on the line and a nurse will answer the phone.    For prescription refill requests, have your pharmacy contact our office and allow 72 hours.    Due to Covid, you will need to wear a mask upon entering the hospital. If you do not have a mask, a mask will be given to you at the Main Entrance upon arrival. For doctor visits, patients may have 1 support person age 60 or older with them. For treatment visits, patients can not have anyone with them due to social distancing guidelines and our immunocompromised population.

## 2021-02-05 LAB — PROTEIN ELECTROPHORESIS, SERUM
A/G Ratio: 1.2 (ref 0.7–1.7)
Albumin ELP: 3.7 g/dL (ref 2.9–4.4)
Alpha-1-Globulin: 0.2 g/dL (ref 0.0–0.4)
Alpha-2-Globulin: 0.6 g/dL (ref 0.4–1.0)
Beta Globulin: 0.9 g/dL (ref 0.7–1.3)
Gamma Globulin: 1.3 g/dL (ref 0.4–1.8)
Globulin, Total: 3.1 g/dL (ref 2.2–3.9)
Total Protein ELP: 6.8 g/dL (ref 6.0–8.5)

## 2021-02-05 LAB — HGB FRACTIONATION CASCADE
Hgb A2: 2.6 % (ref 1.8–3.2)
Hgb A: 61.8 % — ABNORMAL LOW (ref 96.4–98.8)
Hgb F: 0 % (ref 0.0–2.0)
Hgb S: 35.6 % — ABNORMAL HIGH

## 2021-02-05 LAB — HGB SOLUBILITY: Hgb Solubility: POSITIVE — AB

## 2021-02-06 LAB — METHYLMALONIC ACID, SERUM: Methylmalonic Acid, Quantitative: 124 nmol/L (ref 0–378)

## 2021-02-13 NOTE — Progress Notes (Signed)
[place], [place] [address]   CLINIC:  Medical Oncology/Hematology  PCP:  Harl Bowie, [facility] [place] [address] [phone]   REASON FOR VISIT:  Follow-up for abnormal RBC indices  PRIOR THERAPY: None  CURRENT THERAPY: Under work-up  INTERVAL HISTORY:  Mr. Letts 65 y.o. male returns for routine follow-up of his abnormal RBC indices (sickle cell trait).  He was seen for initial consultation with Dr. Delton Coombes on 02/02/2021.  At today's visit, he reports feeling like his usual self.  No recent hospitalizations, surgeries, or changes in baseline health status.  He reports some mild fatigue with energy about 60%, which is his baseline.  No fever, chills, shortness of breath, cough, chest pain, nausea, vomiting, abdominal pain.  Denies any signs or symptoms of blood loss.  He denies any history of acute pain syndrome, vaso-occlusive phenomenon or venous thromboembolism.  No current signs or symptoms of blood clots. No symptoms of exercise intolerance.  He denies hematuria.  Regarding his iron deficiency, he denies any signs or symptoms of blood loss such as hematemesis, hematochezia, melena, or epistaxis.  He does not take any iron supplementation at home.  He has 60% energy and 60% appetite. He endorses that he is maintaining a stable weight.   REVIEW OF SYSTEMS:  Review of Systems  Constitutional:  Positive for fatigue. Negative for appetite change, chills, diaphoresis, fever and unexpected weight change.  HENT:   Negative for lump/mass and nosebleeds.   Eyes:  Negative for eye problems.  Respiratory:  Negative for cough, hemoptysis and shortness of breath.   Cardiovascular:  Positive for palpitations. Negative for chest pain and leg swelling.  Gastrointestinal:  Positive for constipation. Negative for abdominal pain, blood in stool, diarrhea, nausea and vomiting.       Heartburn   Genitourinary:  Negative for hematuria.   Skin: Negative.   Neurological:  Positive for dizziness. Negative for headaches and light-headedness.  Hematological:  Does not bruise/bleed easily.  Psychiatric/Behavioral:  The patient is nervous/anxious.      PAST MEDICAL/SURGICAL HISTORY:  Past Medical History:  Diagnosis Date   Arthritis    Asthma    GERD (gastroesophageal reflux disease)    Hearing loss    Hemorrhoids    Hyperlipidemia    Low testosterone in male    Peripheral neuropathy    Sleep apnea    wears cpap   Sore throat    Wears glasses    Wears hearing aid in both ears    Past Surgical History:  Procedure Laterality Date   ANTERIOR CERVICAL DECOMP/DISCECTOMY FUSION N/A 01/09/2017   Procedure: ANTERIOR CERVICAL DECOMPRESSION FUSION, CERVICAL FOUR-FIVE, CERVICAL FIVE-SIX INSTRUMENTATION AND ALLOGRAFT;  Surgeon: Phylliss Bob, MD;  Location: [facility];  Service: Orthopedics;  Laterality: N/A;  ANTERIOR CERVICAL DECOMPRESSION FUSION, CERVICAL FOUR-FIVE, CERVICAL FIVE-SIX INSTRUMENTATION AND ALLOGRAFT   APPENDECTOMY     BIOPSY  11/12/2019   Procedure: BIOPSY;  Surgeon: Carol Ada, MD;  Location: [facility];  Service: Endoscopy;;   COLONOSCOPY     ESOPHAGOGASTRODUODENOSCOPY (EGD) WITH PROPOFOL N/A 11/12/2019   Procedure: ESOPHAGOGASTRODUODENOSCOPY (EGD) WITH PROPOFOL;  Surgeon: Carol Ada, MD;  Location: [facility];  Service: Endoscopy;  Laterality: N/A;   NECK SURGERY     SAVORY DILATION N/A 11/12/2019   Procedure: SAVORY DILATION;  Surgeon: Carol Ada, MD;  Location: [facility];  Service: Endoscopy;  Laterality: N/A;   SHOULDER SURGERY  2005   rotator cuff and tendons repaired  SOCIAL HISTORY:  Social History   Socioeconomic History   Marital status: Married    Spouse name: Not on file   Number of children: Not on file   Years of education: Not on file   Highest education level: Not on file  Occupational History   Not on file  Tobacco Use    Smoking status: Former    Packs/day: 0.25    Years: 6.00    Pack years: 1.50    Types: Cigarettes   Smokeless tobacco: Never  Vaping Use   Vaping Use: Never used  Substance and Sexual Activity   Alcohol use: Not Currently   Drug use: No   Sexual activity: Not on file  Other Topics Concern   Not on file  Social History Narrative   Not on file   Social Determinants of Health   Financial Resource Strain: Low Risk    Difficulty of Paying Living Expenses: Not hard at all  Food Insecurity: No Food Insecurity   Worried About Charity fundraiser in the Last Year: Never true   [place] in the Last Year: Never true  Transportation Needs: No Transportation Needs   Lack of Transportation (Medical): No   Lack of Transportation (Non-Medical): No  Physical Activity: Inactive   Days of Exercise per Week: 0 days   Minutes of Exercise per Session: 0 min  Stress: Stress Concern Present   Feeling of Stress : To some extent  Social Connections: Not on file  Intimate Partner Violence: Not on file    FAMILY HISTORY:  Family History  Problem Relation Age of Onset   Diabetes Mother    Hypertension Mother    Healthy Father    Cancer Other     CURRENT MEDICATIONS:  Outpatient Encounter Medications as of 02/14/2021  Medication Sig   albuterol (VENTOLIN HFA) 108 (90 Base) MCG/ACT inhaler USE 1 TO 2 INHALATIONS EVERY 6 HOURS AS NEEDED FOR WHEEZING OR SHORTNESS OF BREATH (Patient taking differently: Inhale 2 puffs into the lungs every 6 (six) hours as needed for wheezing or shortness of breath.)   aspirin EC 81 MG tablet Take 81 mg by mouth daily.   atorvastatin (LIPITOR) 10 MG tablet TAKE 1 TABLET DAILY (Patient taking differently: Take 10 mg by mouth daily.)   Blood Glucose Monitoring Suppl KIT Use as directed to check blood sugars 1 time per day dx:e11.22   Buprenorphine HCl 450 MCG FILM Take 300 mcg by mouth every 12 (twelve) hours.    EPINEPHrine 0.3 mg/0.3 mL IJ SOAJ injection  Inject 0.3 mLs (0.3 mg total) into the muscle as needed for anaphylaxis. (Patient taking differently: Inject 0.3 mg into the muscle daily as needed for anaphylaxis.)   gabapentin (NEURONTIN) 300 MG capsule Take 300 mg by mouth 4 (four) times daily.   glucose blood test strip Use as directed to check blood sugars 1 time per day dx:e11.22   Lancets 33G MISC Use as directed to check blood sugars 1 time per day dx:e11.22   lansoprazole (PREVACID) 30 MG capsule Take 30 mg by mouth daily.    LINZESS 145 MCG CAPS capsule Take 145 mcg by mouth daily as needed (constipation).    loratadine (CLARITIN) 10 MG tablet Take 10 mg by mouth daily.   magnesium oxide (MAG-OX) 400 MG tablet Take 400 mg by mouth daily.   montelukast (SINGULAIR) 10 MG tablet TAKE 1 TABLET DAILY (Patient taking differently: Take 10 mg by mouth daily.)   neomycin-polymyxin-hydrocortisone (CORTISPORIN)  3.5-10000-1 OTIC suspension Place 4 drops into both ears 4 (four) times daily. X 7 days   sertraline (ZOLOFT) 50 MG tablet TAKE ONE-HALF TABLET BY MOUTH IN THE MORNING FOR 7 DAYS, THEN TAKE ONE TABLET IN THE MORNING FOR MENTAL HEALTH   tadalafil (CIALIS) 5 MG tablet Take 5 mg by mouth daily.    tamsulosin (FLOMAX) 0.4 MG CAPS capsule Take 0.4 mg by mouth at bedtime.    Testosterone 30 MG/ACT SOLN Apply 30 mg topically daily. Apply under each arm   urea (CARMOL) 20 % cream Apply 1 application topically daily as needed (Feet pain).    No facility-administered encounter medications on file as of 02/14/2021.    ALLERGIES:  No Known Allergies   PHYSICAL EXAM:  ECOG PERFORMANCE STATUS: 1 - Symptomatic but completely ambulatory  There were no vitals filed for this visit. There were no vitals filed for this visit. Physical Exam Constitutional:      Appearance: Normal appearance. He is obese.  HENT:     Head: Normocephalic and atraumatic.     Mouth/Throat:     Mouth: Mucous membranes are moist.  Eyes:     Extraocular Movements:  Extraocular movements intact.     Pupils: Pupils are equal, round, and reactive to light.  Cardiovascular:     Rate and Rhythm: Normal rate and regular rhythm.     Pulses: Normal pulses.     Heart sounds: Normal heart sounds.  Pulmonary:     Effort: Pulmonary effort is normal.     Breath sounds: Normal breath sounds.  Abdominal:     General: Bowel sounds are normal.     Palpations: Abdomen is soft.     Tenderness: There is no abdominal tenderness.  Musculoskeletal:        General: No swelling.     Right lower leg: No edema.     Left lower leg: No edema.  Lymphadenopathy:     Cervical: No cervical adenopathy.  Skin:    General: Skin is warm and dry.  Neurological:     General: No focal deficit present.     Mental Status: He is alert and oriented to person, place, and time.  Psychiatric:        Mood and Affect: Mood normal.        Behavior: Behavior normal.     LABORATORY DATA:  I have reviewed the labs as listed.  CBC    Component Value Date/Time   WBC 2.5 (L) 02/02/2021 1128   RBC 6.27 (H) 02/02/2021 1130   RBC 6.16 (H) 02/02/2021 1128   HGB 13.3 02/02/2021 1128   HGB 13.4 11/23/2020 1512   HCT 42.3 02/02/2021 1128   HCT 43.5 11/23/2020 1512   PLT 150 02/02/2021 1128   PLT 165 11/23/2020 1512   MCV 68.7 (L) 02/02/2021 1128   MCV 68 (L) 11/23/2020 1512   MCH 21.6 (L) 02/02/2021 1128   MCHC 31.4 02/02/2021 1128   RDW 20.3 (H) 02/02/2021 1128   RDW 20.2 (H) 11/23/2020 1512   LYMPHSABS 0.6 (L) 02/02/2021 1128   LYMPHSABS 1.0 11/23/2020 1512   MONOABS 0.3 02/02/2021 1128   EOSABS 0.1 02/02/2021 1128   EOSABS 0.3 11/23/2020 1512   BASOSABS 0.0 02/02/2021 1128   BASOSABS 0.0 11/23/2020 1512   CMP Latest Ref Rng & Units 11/23/2020 11/17/2019 05/19/2019  Glucose 65 - 99 mg/dL 89 100(H) 98  BUN 8 - 27 mg/dL _0 Creatinine 0.76 - 1.27 mg/dL 1.18 1.11  1.13  Sodium 134 - 144 mmol/L 136 137 138  Potassium 3.5 - 5.2 mmol/L 4.3 4.4 4.2  Chloride 96 - 106 mmol/L 101  100 101  CO2 20 - 29 mmol/L _0 Calcium 8.6 - 10.2 mg/dL 9.0 9.3 9.2  Total Protein 6.0 - 8.5 g/dL 6.8 7.3 6.9  Total Bilirubin 0.0 - 1.2 mg/dL 0.4 0.5 0.6  Alkaline Phos 44 - 121 IU/L 81 88 70  AST 0 - 40 IU/L _1 ALT 0 - 44 IU/L _2 DIAGNOSTIC IMAGING:  I have independently reviewed the relevant imaging and discussed with the patient.  ASSESSMENT & PLAN: 1.  Sickle cell trait - Seen at the request of Dr. 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  LDH normal. - Patient is asymptomatic, has never had any symptoms suggestive of acute pain crisis or venoocclusive events.   - Patient's son also has sickle cell trait, but has never shown any symptoms or required treatment. - Discussed with patient that sickle cell trait is a benign carrier condition for the sickle hemoglobin variant, but that in most patients it does not cause any symptoms or clinical issues.  We did discuss that in very rare cases patients can have symptoms of acute pain and venoocclusive disease, but that this is highly unlikely since the patient has been asymptomatic thus far. - Discussed that the patient is at risk for some blood sickling in cases of significant hypoxia (such as high-altitude and pressurized aircraft, very strenuous exercise with dehydration, etc.). - Discussed with patient that his children and grandchildren would benefit from being screened for sickle cell trait to assist with future reproductive decisions. - PLAN: No indication for ongoing hematology follow-up of asymptomatic sickle cell trait. - Extensive discussion of diagnosis as above. - Patient will continue to follow with primary care provider, but is aware that he can be  referred back to [place] in the future if needed.  2.  Iron deficiency without anemia  - Work-up of abnormal RBC indices (02/02/2021) revealed iron deficiency with ferritin 6, iron saturation 3%, serum iron 14, and TIBC 435. - No hematemesis, hematochezia, or melena - EGD (11/12/2019): Normal stomach and duodenum.  No significant abnormalities in the esophagus. - Colonoscopy (08/18/2018): Colon polyp, otherwise unremarkable. - Symptomatic with mild fatigue, energy about 60% - PLAN: Due to his history of constipation, recommend ferro oral iron supplementation with ferrous bisglycinate along with orange juice to improve absorption. - If unable to tolerate oral iron supplementation or if no improvement in levels after 3 months of treatment, would recommend IV iron supplementation. - RTC in 3 months for repeat CBC and iron panel.    3.  Low white count: - He had low white count over several years.  He does not have any recurrent infections. - Most recent CBC (02/02/2021): WBC 2.5 with ANC 1.5 and lymphocytes 0.6 - SPEP negative.  Normal folate, B12 and methylmalonic acid.  LDH normal. 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  No further work-up or intervention needed at this time.  4.  Social/family history: - He worked in the [employer] for 23 years and retired as a first Chief Technology Officer.  No clear exposure  to chemicals.  Quit smoking in 1979. - He has sleep apnea and has been using CPAP since 2006. - Son had HBs trait.  Mother is a Jehovah witness and also had colon cancer.  Maternal aunt died of ovarian cancer in her 23s.   PLAN SUMMARY & DISPOSITION: Labs in 3 months Office visit after labs  All questions were answered. The patient knows to call the clinic with any problems, questions or concerns.  Medical decision making: Moderate  Time spent on visit: I spent 20 minutes counseling the patient face to face. The total time spent in the appointment was 30 minutes and more  than 50% was on counseling.   Harriett Rush, PA-C  02/14/2021 4:37 PM

## 2021-02-14 ENCOUNTER — Other Ambulatory Visit: Payer: Self-pay

## 2021-02-14 ENCOUNTER — Inpatient Hospital Stay (HOSPITAL_BASED_OUTPATIENT_CLINIC_OR_DEPARTMENT_OTHER): Payer: Medicare Other | Admitting: Physician Assistant

## 2021-02-14 VITALS — BP 139/89 | HR 72 | Temp 98.4°F | Resp 16 | Wt 206.0 lb

## 2021-02-14 DIAGNOSIS — D573 Sickle-cell trait: Secondary | ICD-10-CM | POA: Diagnosis not present

## 2021-02-14 DIAGNOSIS — D708 Other neutropenia: Secondary | ICD-10-CM

## 2021-02-14 DIAGNOSIS — D509 Iron deficiency anemia, unspecified: Secondary | ICD-10-CM

## 2021-02-14 DIAGNOSIS — R718 Other abnormality of red blood cells: Secondary | ICD-10-CM | POA: Diagnosis not present

## 2021-02-14 NOTE — Patient Instructions (Signed)
[facility] at [facility] Discharge Instructions  You were seen today by Rojelio Brenner PA-C for your abnormal blood cells.  The test we checked at your last visit showed the following.  SICKLE CELL TRAIT: This means that you are a "carrier" of sickle cell disease, meaning that you only have 1 broken gene.  There is a chance that this was passed on to your children, so we suggest that your children also pursue genetic testing from their doctors.  The sickle cell trait does not cause any problems in most patients, but may cause findings on your blood counts such as small red blood cells and increased number of red blood cells.  These findings are benign and do not cause any risk to you in most cases.  There are some very rare cases where sickle cell trait can cause problems, which are further described in the handout you were given during your appointment today.  IRON DEFICIENCY: Your iron levels are low.  I suggest that you start taking oral iron supplementation with FERROUS BISGLYCINATE.  This is gentler on your stomach then regular iron supplement (ferrous sulfate).  Take it in the morning with a glass of orange juice to help your body absorb it better.  If it makes your constipation worse, you can stop taking it and call our office to see if we can get you set up for IV iron instead.  LABS: Return in 3 months for repeat labs  OTHER TESTS: No other tests at this time  MEDICATIONS: Iron supplementation as above  FOLLOW-UP APPOINTMENT: Office visit in 3 months, after labs   Thank you for choosing [facility] at [facility] to provide your oncology and hematology care.  To afford each patient quality time with our provider, please arrive at least 15 minutes before your scheduled appointment time.   If you have a lab appointment with the Cancer Center please come in thru the Main Entrance and check in at the main information desk.  You need to  re-schedule your appointment should you arrive 10 or more minutes late.  We strive to give you quality time with our providers, and arriving late affects you and other patients whose appointments are after yours.  Also, if you no show three or more times for appointments you may be dismissed from the clinic at the providers discretion.     Again, thank you for choosing [facility].  Our hope is that these requests will decrease the amount of time that you wait before being seen by our physicians.       _____________________________________________________________  Should you have questions after your visit to [facility], please contact our office at [phone] and follow the prompts.  Our office hours are 8:00 a.m. and 4:30 p.m. Monday - Friday.  Please note that voicemails left after 4:00 p.m. may not be returned until the following business day.  We are closed weekends and major holidays.  You do have access to a nurse 24-7, just call the main number to the clinic [phone] and do not press any options, hold on the line and a nurse will answer the phone.    For prescription refill requests, have your pharmacy contact our office and allow 72 hours.    Due to Covid, you will need to wear a mask upon entering the hospital. If you do not have a mask, a mask will be given to you at  the Main Entrance upon arrival. For doctor visits, patients may have 1 support person age 44 or older with them. For treatment visits, patients can not have anyone with them due to social distancing guidelines and our immunocompromised population.

## 2021-05-10 ENCOUNTER — Inpatient Hospital Stay (HOSPITAL_COMMUNITY): Payer: Medicare Other | Attending: Hematology

## 2021-05-10 ENCOUNTER — Other Ambulatory Visit: Payer: Self-pay

## 2021-05-10 DIAGNOSIS — Z800 Family history of malignant neoplasm of digestive organs: Secondary | ICD-10-CM | POA: Insufficient documentation

## 2021-05-10 DIAGNOSIS — Z8041 Family history of malignant neoplasm of ovary: Secondary | ICD-10-CM | POA: Insufficient documentation

## 2021-05-10 DIAGNOSIS — D509 Iron deficiency anemia, unspecified: Secondary | ICD-10-CM

## 2021-05-10 DIAGNOSIS — G4730 Sleep apnea, unspecified: Secondary | ICD-10-CM | POA: Diagnosis not present

## 2021-05-10 DIAGNOSIS — D573 Sickle-cell trait: Secondary | ICD-10-CM | POA: Diagnosis not present

## 2021-05-10 DIAGNOSIS — Z87891 Personal history of nicotine dependence: Secondary | ICD-10-CM | POA: Diagnosis not present

## 2021-05-10 DIAGNOSIS — E611 Iron deficiency: Secondary | ICD-10-CM | POA: Diagnosis not present

## 2021-05-10 LAB — CBC WITH DIFFERENTIAL/PLATELET
Abs Immature Granulocytes: 0.01 10*3/uL (ref 0.00–0.07)
Basophils Absolute: 0 10*3/uL (ref 0.0–0.1)
Basophils Relative: 0 %
Eosinophils Absolute: 0.2 10*3/uL (ref 0.0–0.5)
Eosinophils Relative: 5 %
HCT: 45.7 % (ref 39.0–52.0)
Hemoglobin: 14.1 g/dL (ref 13.0–17.0)
Immature Granulocytes: 0 %
Lymphocytes Relative: 39 %
Lymphs Abs: 1.1 10*3/uL (ref 0.7–4.0)
MCH: 22.4 pg — ABNORMAL LOW (ref 26.0–34.0)
MCHC: 30.9 g/dL (ref 30.0–36.0)
MCV: 72.5 fL — ABNORMAL LOW (ref 80.0–100.0)
Monocytes Absolute: 0.2 10*3/uL (ref 0.1–1.0)
Monocytes Relative: 7 %
Neutro Abs: 1.3 10*3/uL — ABNORMAL LOW (ref 1.7–7.7)
Neutrophils Relative %: 49 %
Platelets: 101 10*3/uL — ABNORMAL LOW (ref 150–400)
RBC: 6.3 MIL/uL — ABNORMAL HIGH (ref 4.22–5.81)
RDW: 23.8 % — ABNORMAL HIGH (ref 11.5–15.5)
WBC: 2.8 10*3/uL — ABNORMAL LOW (ref 4.0–10.5)
nRBC: 0 % (ref 0.0–0.2)

## 2021-05-10 LAB — IRON AND TIBC
Iron: 96 ug/dL (ref 45–182)
Saturation Ratios: 27 % (ref 17.9–39.5)
TIBC: 361 ug/dL (ref 250–450)
UIBC: 265 ug/dL

## 2021-05-10 LAB — FERRITIN: Ferritin: 24 ng/mL (ref 24–336)

## 2021-05-16 NOTE — Progress Notes (Signed)
[place], [place] [address]   CLINIC:  Medical Oncology/Hematology  PCP:  Harl Bowie, [facility] [place] [address] [phone]   REASON FOR VISIT:  Follow-up for sickle cell trait, iron deficiency without anemia  PRIOR THERAPY: None  CURRENT THERAPY: Ferrous bisglycinate  INTERVAL HISTORY:  Mr. Vonstein 66 y.o. male returns for routine follow-up of his iron deficiency and sickle cell trait.  He was last seen by Tarri Abernethy PA-C on 02/14/2021.  At today's visit, he reports feeling well.  No recent hospitalizations, surgeries, or changes in baseline health status.  He has been taking his ferrous bisglycinate daily without any significant side effects.  He reports that his energy is better and he has been more active outside.  He is even started walking daily and has lost about 10 pounds through diet and exercise.  He continues to deny any signs of blood loss such as hematemesis, hematochezia, melena, or epistaxis.  He denies any pica, restless legs, headaches, chest pain, dyspnea on exertion, or syncope.  No B symptoms such as fever, chills, night sweats, unintentional weight loss.  He has not had any frequent infections.  He has 75% energy and 100% appetite. He endorses that he is maintaining a stable weight.   REVIEW OF SYSTEMS:  Review of Systems  Constitutional:  Negative for appetite change, chills, diaphoresis, fatigue, fever and unexpected weight change.  HENT:   Negative for lump/mass and nosebleeds.   Eyes:  Negative for eye problems.  Respiratory:  Negative for cough, hemoptysis and shortness of breath.   Cardiovascular:  Negative for chest pain, leg swelling and palpitations.  Gastrointestinal:  Negative for abdominal pain, blood in stool, constipation, diarrhea, nausea and vomiting.  Genitourinary:  Negative for hematuria.   Skin: Negative.   Neurological:  Positive for  light-headedness (occasional lightheadness associated with meds). Negative for dizziness and headaches.  Hematological:  Does not bruise/bleed easily.     PAST MEDICAL/SURGICAL HISTORY:  Past Medical History:  Diagnosis Date   Arthritis    Asthma    GERD (gastroesophageal reflux disease)    Hearing loss    Hemorrhoids    Hyperlipidemia    Low testosterone in male    Peripheral neuropathy    Sleep apnea    wears cpap   Sore throat    Wears glasses    Wears hearing aid in both ears    Past Surgical History:  Procedure Laterality Date   ANTERIOR CERVICAL DECOMP/DISCECTOMY FUSION N/A 01/09/2017   Procedure: ANTERIOR CERVICAL DECOMPRESSION FUSION, CERVICAL FOUR-FIVE, CERVICAL FIVE-SIX INSTRUMENTATION AND ALLOGRAFT;  Surgeon: Phylliss Bob, MD;  Location: [place];  Service: Orthopedics;  Laterality: N/A;  ANTERIOR CERVICAL DECOMPRESSION FUSION, CERVICAL FOUR-FIVE, CERVICAL FIVE-SIX INSTRUMENTATION AND ALLOGRAFT   APPENDECTOMY     BIOPSY  11/12/2019   Procedure: BIOPSY;  Surgeon: Carol Ada, MD;  Location: [facility];  Service: Endoscopy;;   COLONOSCOPY     ESOPHAGOGASTRODUODENOSCOPY (EGD) WITH PROPOFOL N/A 11/12/2019   Procedure: ESOPHAGOGASTRODUODENOSCOPY (EGD) WITH PROPOFOL;  Surgeon: Carol Ada, MD;  Location: [facility];  Service: Endoscopy;  Laterality: N/A;   NECK SURGERY     SAVORY DILATION N/A 11/12/2019   Procedure: SAVORY DILATION;  Surgeon: Carol Ada, MD;  Location: [facility];  Service: Endoscopy;  Laterality: N/A;   SHOULDER SURGERY  2005   rotator cuff and tendons repaired     SOCIAL HISTORY:  Social History   Socioeconomic History   Marital status:  Married    Spouse name: Not on file   Number of children: Not on file   Years of education: Not on file   Highest education level: Not on file  Occupational History   Not on file  Tobacco Use   Smoking status: Former    Packs/day: 0.25    Years: 6.00    Pack years: 1.50    Types: Cigarettes    Smokeless tobacco: Never  Vaping Use   Vaping Use: Never used  Substance and Sexual Activity   Alcohol use: Not Currently   Drug use: No   Sexual activity: Not on file  Other Topics Concern   Not on file  Social History Narrative   Not on file   Social Determinants of Health   Financial Resource Strain: Low Risk    Difficulty of Paying Living Expenses: Not hard at all  Food Insecurity: No Food Insecurity   Worried About Charity fundraiser in the Last Year: Never true   [place] in the Last Year: Never true  Transportation Needs: No Transportation Needs   Lack of Transportation (Medical): No   Lack of Transportation (Non-Medical): No  Physical Activity: Inactive   Days of Exercise per Week: 0 days   Minutes of Exercise per Session: 0 min  Stress: Stress Concern Present   Feeling of Stress : To some extent  Social Connections: Not on file  Intimate Partner Violence: Not on file    FAMILY HISTORY:  Family History  Problem Relation Age of Onset   Diabetes Mother    Hypertension Mother    Healthy Father    Cancer Other     CURRENT MEDICATIONS:  Outpatient Encounter Medications as of 05/17/2021  Medication Sig   albuterol (VENTOLIN HFA) 108 (90 Base) MCG/ACT inhaler USE 1 TO 2 INHALATIONS EVERY 6 HOURS AS NEEDED FOR WHEEZING OR SHORTNESS OF BREATH (Patient taking differently: Inhale 2 puffs into the lungs every 6 (six) hours as needed for wheezing or shortness of breath.)   aspirin EC 81 MG tablet Take 81 mg by mouth daily.   atorvastatin (LIPITOR) 10 MG tablet TAKE 1 TABLET DAILY (Patient taking differently: Take 10 mg by mouth daily.)   Blood Glucose Monitoring Suppl KIT Use as directed to check blood sugars 1 time per day dx:e11.22   Buprenorphine HCl 450 MCG FILM Take 300 mcg by mouth every 12 (twelve) hours.    EPINEPHrine 0.3 mg/0.3 mL IJ SOAJ injection Inject 0.3 mLs (0.3 mg total) into the muscle as needed for anaphylaxis. (Patient taking differently: Inject  0.3 mg into the muscle daily as needed for anaphylaxis.)   gabapentin (NEURONTIN) 300 MG capsule Take 300 mg by mouth 4 (four) times daily.   glucose blood test strip Use as directed to check blood sugars 1 time per day dx:e11.22   Lancets 33G MISC Use as directed to check blood sugars 1 time per day dx:e11.22   lansoprazole (PREVACID) 30 MG capsule Take 30 mg by mouth daily.    LINZESS 145 MCG CAPS capsule Take 145 mcg by mouth daily as needed (constipation).    loratadine (CLARITIN) 10 MG tablet Take 10 mg by mouth daily.   magnesium oxide (MAG-OX) 400 MG tablet Take 400 mg by mouth daily.   montelukast (SINGULAIR) 10 MG tablet TAKE 1 TABLET DAILY (Patient taking differently: Take 10 mg by mouth daily.)   neomycin-polymyxin-hydrocortisone (CORTISPORIN) 3.5-10000-1 OTIC suspension Place 4 drops into both ears 4 (four) times daily.  X 7 days   sertraline (ZOLOFT) 50 MG tablet TAKE ONE-HALF TABLET BY MOUTH IN THE MORNING FOR 7 DAYS, THEN TAKE ONE TABLET IN THE MORNING FOR MENTAL HEALTH   tadalafil (CIALIS) 5 MG tablet Take 5 mg by mouth daily.    tamsulosin (FLOMAX) 0.4 MG CAPS capsule Take 0.4 mg by mouth at bedtime.    Testosterone 30 MG/ACT SOLN Apply 30 mg topically daily. Apply under each arm   urea (CARMOL) 20 % cream Apply 1 application topically daily as needed (Feet pain).    No facility-administered encounter medications on file as of 05/17/2021.    ALLERGIES:  No Known Allergies   PHYSICAL EXAM:  ECOG PERFORMANCE STATUS: 0 - Asymptomatic  There were no vitals filed for this visit. There were no vitals filed for this visit. Physical Exam Constitutional:      Appearance: Normal appearance.  HENT:     Head: Normocephalic and atraumatic.     Mouth/Throat:     Mouth: Mucous membranes are moist.  Eyes:     Extraocular Movements: Extraocular movements intact.     Pupils: Pupils are equal, round, and reactive to light.  Cardiovascular:     Rate and Rhythm: Normal rate and  regular rhythm.     Pulses: Normal pulses.     Heart sounds: Normal heart sounds.  Pulmonary:     Effort: Pulmonary effort is normal.     Breath sounds: Normal breath sounds.  Abdominal:     General: Bowel sounds are normal.     Palpations: Abdomen is soft.     Tenderness: There is no abdominal tenderness.  Musculoskeletal:        General: No swelling.     Right lower leg: No edema.     Left lower leg: No edema.  Lymphadenopathy:     Cervical: No cervical adenopathy.  Skin:    General: Skin is warm and dry.  Neurological:     General: No focal deficit present.     Mental Status: He is alert and oriented to person, place, and time.  Psychiatric:        Mood and Affect: Mood normal.        Behavior: Behavior normal.     LABORATORY DATA:  I have reviewed the labs as listed.  CBC    Component Value Date/Time   WBC 2.8 (L) 05/10/2021 1028   RBC 6.30 (H) 05/10/2021 1028   HGB 14.1 05/10/2021 1028   HGB 13.4 11/23/2020 1512   HCT 45.7 05/10/2021 1028   HCT 43.5 11/23/2020 1512   PLT 101 (L) 05/10/2021 1028   PLT 165 11/23/2020 1512   MCV 72.5 (L) 05/10/2021 1028   MCV 68 (L) 11/23/2020 1512   MCH 22.4 (L) 05/10/2021 1028   MCHC 30.9 05/10/2021 1028   RDW 23.8 (H) 05/10/2021 1028   RDW 20.2 (H) 11/23/2020 1512   LYMPHSABS 1.1 05/10/2021 1028   LYMPHSABS 1.0 11/23/2020 1512   MONOABS 0.2 05/10/2021 1028   EOSABS 0.2 05/10/2021 1028   EOSABS 0.3 11/23/2020 1512   BASOSABS 0.0 05/10/2021 1028   BASOSABS 0.0 11/23/2020 1512   CMP Latest Ref Rng & Units 11/23/2020 11/17/2019 05/19/2019  Glucose 65 - 99 mg/dL 89 100(H) 98  BUN 8 - 27 mg/dL _0 Creatinine 0.76 - 1.27 mg/dL 1.18 1.11 1.13  Sodium 134 - 144 mmol/L 136 137 138  Potassium 3.5 - 5.2 mmol/L 4.3 4.4 4.2  Chloride 96 - 106 mmol/L 101  100 101  CO2 20 - 29 mmol/L _0 Calcium 8.6 - 10.2 mg/dL 9.0 9.3 9.2  Total Protein 6.0 - 8.5 g/dL 6.8 7.3 6.9  Total Bilirubin 0.0 - 1.2 mg/dL 0.4 0.5 0.6  Alkaline  Phos 44 - 121 IU/L 81 88 70  AST 0 - 40 IU/L _1 ALT 0 - 44 IU/L _2 DIAGNOSTIC IMAGING:  I have independently reviewed the relevant imaging and discussed with the patient.  ASSESSMENT & PLAN: 1.  Iron deficiency without anemia  - Work-up of abnormal RBC indices (02/02/2021) revealed iron deficiency with ferritin 6, iron saturation 3%, serum iron 14, and TIBC 435. - EGD (11/12/2019): Normal stomach and duodenum.  No significant abnormalities in the esophagus. - Colonoscopy (08/18/2018): Colon polyp, otherwise unremarkable. - Patient was started on oral iron supplementation (ferrous bisglycinate) at his last visit in November 2022, which she has been tolerating well.   - No hematemesis, hematochezia, or melena   - Energy is improved. - Most recent labs (05/10/2021) show Hgb 14.1, MCV 72.5/RBC 6.30 (sickle cell trait); ferritin 24, iron saturation 27% - PLAN: Iron levels have improved on oral supplementation, recommend that he continue oral iron supplementation.  No need for IV iron at this time. - Repeat CBC/iron panel and RTC in 6 months.  2.  Low white count: - He had low white count over several years.  He does not have any recurrent infections. - Prior CBC (02/02/2021): WBC 2.5 with ANC 1.5 and lymphocytes 0.6 - Most recent CBC (05/10/2021): WBC 2.8, ANC 1.3, lymphocytes 1.1 - SPEP negative.  Normal folate, B12 and methylmalonic acid.  LDH normal. 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  No further work-up or intervention needed at this time.  3.  Sickle cell trait - Seen at the request of Dr. 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  LDH normal. - Patient is asymptomatic, has never had any  symptoms suggestive of acute pain crisis or venoocclusive events.   - Patient's son also has sickle cell trait, but has never shown any symptoms or required treatment. - Discussed with patient that sickle cell trait is a benign carrier condition for the sickle hemoglobin variant, but that in most patients it does not cause any symptoms or clinical issues.  We did discuss that in very rare cases patients can have symptoms of acute pain and venoocclusive disease, but that this is highly unlikely since the patient has been asymptomatic thus far. - Discussed that the patient is at risk for some blood sickling in cases of significant hypoxia (such as high-altitude and pressurized aircraft, very strenuous exercise with dehydration, etc.). - Discussed with patient that his children and grandchildren would benefit from being screened for sickle cell trait to assist with future reproductive decisions. - PLAN: No indication for ongoing hematology follow-up of asymptomatic sickle cell trait. - Extensive discussion of diagnosis as above. - Patient will continue to follow with primary care provider, but is aware that he can be referred back to [place] in the future if needed.  4.  Social/family history: - He worked in the [employer] for 23 years and retired as a first Chief Technology Officer.  No clear exposure  to chemicals.  Quit smoking in 1979. - He has sleep apnea and has been using CPAP since 2006. - Son had HBs trait.  Mother is a Jehovah witness and also had colon cancer.  Maternal aunt died of ovarian cancer in her 89s.   PLAN SUMMARY & DISPOSITION: Same-day labs and office visit in 6 months  All questions were answered. The patient knows to call the clinic with any problems, questions or concerns.  Medical decision making: Low  Time spent on visit: I spent 15 minutes counseling the patient face to face. The total time spent in the appointment was 25 minutes and more than 50% was on counseling.   Harriett Rush, PA-C  05/17/2021 10:39 AM

## 2021-05-17 ENCOUNTER — Inpatient Hospital Stay (HOSPITAL_BASED_OUTPATIENT_CLINIC_OR_DEPARTMENT_OTHER): Payer: Medicare Other | Admitting: Physician Assistant

## 2021-05-17 ENCOUNTER — Other Ambulatory Visit: Payer: Self-pay

## 2021-05-17 VITALS — BP 139/91 | HR 68 | Temp 98.7°F | Resp 18 | Ht 69.49 in | Wt 197.5 lb

## 2021-05-17 DIAGNOSIS — D509 Iron deficiency anemia, unspecified: Secondary | ICD-10-CM

## 2021-05-17 DIAGNOSIS — D573 Sickle-cell trait: Secondary | ICD-10-CM

## 2021-05-17 DIAGNOSIS — D708 Other neutropenia: Secondary | ICD-10-CM

## 2021-05-17 NOTE — Patient Instructions (Signed)
[facility] at [facility] Discharge Instructions  You were seen today by Rojelio Brenner PA-C for your iron deficiency.  Your iron levels are improved!  Keep taking your iron tablet once daily.  We will check your levels again in 6 months.    LABS: Return in 6 months for repeat labs   FOLLOW-UP APPOINTMENT: Office visit in 6 months   Thank you for choosing [facility] at [facility] to provide your oncology and hematology care.  To afford each patient quality time with our provider, please arrive at least 15 minutes before your scheduled appointment time.   If you have a lab appointment with the Cancer Center please come in thru the Main Entrance and check in at the main information desk.  You need to re-schedule your appointment should you arrive 10 or more minutes late.  We strive to give you quality time with our providers, and arriving late affects you and other patients whose appointments are after yours.  Also, if you no show three or more times for appointments you may be dismissed from the clinic at the providers discretion.     Again, thank you for choosing [facility].  Our hope is that these requests will decrease the amount of time that you wait before being seen by our physicians.       _____________________________________________________________  Should you have questions after your visit to [facility], please contact our office at [phone] and follow the prompts.  Our office hours are 8:00 a.m. and 4:30 p.m. Monday - Friday.  Please note that voicemails left after 4:00 p.m. may not be returned until the following business day.  We are closed weekends and major holidays.  You do have access to a nurse 24-7, just call the main number to the clinic [phone] and do not press any options, hold on the line and a nurse will answer the phone.    For prescription refill requests, have your pharmacy contact  our office and allow 72 hours.    Due to Covid, you will need to wear a mask upon entering the hospital. If you do not have a mask, a mask will be given to you at the Main Entrance upon arrival. For doctor visits, patients may have 1 support person age 78 or older with them. For treatment visits, patients can not have anyone with them due to social distancing guidelines and our immunocompromised population.

## 2021-05-24 ENCOUNTER — Ambulatory Visit: Payer: Medicare Other | Admitting: Internal Medicine

## 2021-06-15 ENCOUNTER — Other Ambulatory Visit: Payer: Self-pay | Admitting: Orthopedic Surgery

## 2021-06-15 DIAGNOSIS — M542 Cervicalgia: Secondary | ICD-10-CM

## 2021-06-26 ENCOUNTER — Ambulatory Visit
Admission: RE | Admit: 2021-06-26 | Discharge: 2021-06-26 | Disposition: A | Discharge status: Home / Self Care | Payer: Medicare Other | Source: Ambulatory Visit | Attending: Orthopedic Surgery | Admitting: Orthopedic Surgery

## 2021-06-26 DIAGNOSIS — M542 Cervicalgia: Secondary | ICD-10-CM

## 2021-07-17 ENCOUNTER — Telehealth: Payer: Medicare Other | Admitting: Family Medicine

## 2021-07-17 DIAGNOSIS — J069 Acute upper respiratory infection, unspecified: Secondary | ICD-10-CM | POA: Diagnosis not present

## 2021-07-17 MED ORDER — FLUTICASONE PROPIONATE 50 MCG/ACT NA SUSP: 2.0000 | Freq: Every day | NASAL | 0 refills | Status: AC

## 2021-07-17 MED ORDER — BENZONATATE 100 MG PO CAPS: 100.0000 mg | ORAL_CAPSULE | Freq: Two times a day (BID) | ORAL | 0 refills | Status: DC | PRN

## 2021-07-17 NOTE — Progress Notes (Signed)

## 2021-11-14 NOTE — Progress Notes (Unsigned)
[place] [place], [place] [address]   CLINIC:  Medical Oncology/Hematology  PCP:  Harl Bowie, [facility] [place] [address] [phone]   REASON FOR VISIT:  Follow-up for sickle cell trait, iron deficiency without anemia  PRIOR THERAPY: None  CURRENT THERAPY: Ferrous bisglycinate  INTERVAL HISTORY:  Mr. Andrew Fisher 66 y.o. male returns for routine follow-up of his iron deficiency and sickle cell trait.  He was last seen by Tarri Abernethy PA-C on 05/17/2021.  At today's visit, he reports feeling well.  No recent hospitalizations, surgeries, or changes in baseline health status.  He has been taking his ferrous bisglycinate daily without any significant side effects.  He reports that his energy is better.  He continues to deny any signs of blood loss such as hematemesis, hematochezia, melena, or epistaxis.  He denies any pica, restless legs, chest pain, dyspnea on exertion, or syncope.    He has some intermittent headaches and dizziness. No B symptoms such as fever, chills, night sweats, unintentional weight loss.   He has not had any frequent infections.  Most recent labs show mild thrombocytopenia.  He has 75% energy and 75% appetite. He endorses that he is maintaining a stable weight.   REVIEW OF SYSTEMS:  Review of Systems  Constitutional:  Negative for appetite change, chills, diaphoresis, fatigue, fever and unexpected weight change.  HENT:   Negative for lump/mass and nosebleeds.   Eyes:  Negative for eye problems.  Respiratory:  Positive for cough and shortness of breath (asthma). Negative for hemoptysis.   Cardiovascular:  Negative for chest pain, leg swelling and palpitations.  Gastrointestinal:  Negative for abdominal pain, blood in stool, constipation, diarrhea, nausea and vomiting.  Genitourinary:  Negative for hematuria.   Skin: Negative.   Neurological:  Positive for light-headedness (occasional  lightheadness associated with meds) and numbness. Negative for dizziness and headaches.  Hematological:  Does not bruise/bleed easily.      PAST MEDICAL/SURGICAL HISTORY:  Past Medical History:  Diagnosis Date   Arthritis    Asthma    GERD (gastroesophageal reflux disease)    Hearing loss    Hemorrhoids    Hyperlipidemia    Low testosterone in male    Peripheral neuropathy    Sleep apnea    wears cpap   Sore throat    Wears glasses    Wears hearing aid in both ears    Past Surgical History:  Procedure Laterality Date   ANTERIOR CERVICAL DECOMP/DISCECTOMY FUSION N/A 01/09/2017   Procedure: ANTERIOR CERVICAL DECOMPRESSION FUSION, CERVICAL FOUR-FIVE, CERVICAL FIVE-SIX INSTRUMENTATION AND ALLOGRAFT;  Surgeon: Phylliss Bob, MD;  Location: [facility];  Service: Orthopedics;  Laterality: N/A;  ANTERIOR CERVICAL DECOMPRESSION FUSION, CERVICAL FOUR-FIVE, CERVICAL FIVE-SIX INSTRUMENTATION AND ALLOGRAFT   APPENDECTOMY     BIOPSY  11/12/2019   Procedure: BIOPSY;  Surgeon: Carol Ada, MD;  Location: [facility];  Service: Endoscopy;;   COLONOSCOPY     ESOPHAGOGASTRODUODENOSCOPY (EGD) WITH PROPOFOL N/A 11/12/2019   Procedure: ESOPHAGOGASTRODUODENOSCOPY (EGD) WITH PROPOFOL;  Surgeon: Carol Ada, MD;  Location: [facility];  Service: Endoscopy;  Laterality: N/A;   NECK SURGERY     SAVORY DILATION N/A 11/12/2019   Procedure: SAVORY DILATION;  Surgeon: Carol Ada, MD;  Location: [facility];  Service: Endoscopy;  Laterality: N/A;   SHOULDER SURGERY  2005   rotator cuff and tendons repaired     SOCIAL HISTORY:  Social History   Socioeconomic History   Marital status: Married  Spouse name: Not on file   Number of children: Not on file   Years of education: Not on file   Highest education level: Not on file  Occupational History   Not on file  Tobacco Use   Smoking status: Former    Packs/day: 0.25    Years: 6.00    Total pack years: 1.50    Types: Cigarettes   Smokeless  tobacco: Never  Vaping Use   Vaping Use: Never used  Substance and Sexual Activity   Alcohol use: Not Currently   Drug use: No   Sexual activity: Not on file  Other Topics Concern   Not on file  Social History Narrative   Not on file   Social Determinants of Health   Financial Resource Strain: Low Risk  (11/23/2020)   Overall Financial Resource Strain (CARDIA)    Difficulty of Paying Living Expenses: Not hard at all  Food Insecurity: No Food Insecurity (11/23/2020)   Hunger Vital Sign    Worried About Running Out of Food in the Last Year: Never true    [place] in the Last Year: Never true  Transportation Needs: No Transportation Needs (11/23/2020)   PRAPARE - Hydrologist (Medical): No    Lack of Transportation (Non-Medical): No  Physical Activity: Inactive (11/23/2020)   Exercise Vital Sign    Days of Exercise per Week: 0 days    Minutes of Exercise per Session: 0 min  Stress: Stress Concern Present (11/23/2020)   [place]    Feeling of Stress : To some extent  Social Connections: Not on file  Intimate Partner Violence: Not on file    FAMILY HISTORY:  Family History  Problem Relation Age of Onset   Diabetes Mother    Hypertension Mother    Healthy Father    Cancer Other     CURRENT MEDICATIONS:  Outpatient Encounter Medications as of 11/15/2021  Medication Sig   albuterol (VENTOLIN HFA) 108 (90 Base) MCG/ACT inhaler USE 1 TO 2 INHALATIONS EVERY 6 HOURS AS NEEDED FOR WHEEZING OR SHORTNESS OF BREATH (Patient taking differently: Inhale 2 puffs into the lungs every 6 (six) hours as needed for wheezing or shortness of breath.)   aspirin EC 81 MG tablet Take 81 mg by mouth daily.   atorvastatin (LIPITOR) 10 MG tablet TAKE 1 TABLET DAILY (Patient taking differently: Take 10 mg by mouth daily.)   benzonatate (TESSALON) 100 MG capsule Take 1 capsule (100 mg total) by mouth 2 (two)  times daily as needed for cough.   Blood Glucose Monitoring Suppl KIT Use as directed to check blood sugars 1 time per day dx:e11.22   Buprenorphine HCl 450 MCG FILM Take 300 mcg by mouth every 12 (twelve) hours.    EPINEPHrine 0.3 mg/0.3 mL IJ SOAJ injection Inject 0.3 mLs (0.3 mg total) into the muscle as needed for anaphylaxis. (Patient taking differently: Inject 0.3 mg into the muscle daily as needed for anaphylaxis.)   fluticasone (FLONASE) 50 MCG/ACT nasal spray Place 2 sprays into both nostrils daily.   gabapentin (NEURONTIN) 300 MG capsule Take 300 mg by mouth 4 (four) times daily.   glucose blood test strip Use as directed to check blood sugars 1 time per day dx:e11.22   Lancets 33G MISC Use as directed to check blood sugars 1 time per day dx:e11.22   lansoprazole (PREVACID) 30 MG capsule Take 30 mg by mouth daily.  LINZESS 145 MCG CAPS capsule Take 145 mcg by mouth daily as needed (constipation).    loratadine (CLARITIN) 10 MG tablet Take 10 mg by mouth daily.   magnesium oxide (MAG-OX) 400 MG tablet Take 400 mg by mouth daily.   montelukast (SINGULAIR) 10 MG tablet TAKE 1 TABLET DAILY (Patient taking differently: Take 10 mg by mouth daily.)   neomycin-polymyxin-hydrocortisone (CORTISPORIN) 3.5-10000-1 OTIC suspension Place 4 drops into both ears 4 (four) times daily. X 7 days   sertraline (ZOLOFT) 50 MG tablet Take 100 mg by mouth daily.   tadalafil (CIALIS) 5 MG tablet Take 5 mg by mouth daily.    tamsulosin (FLOMAX) 0.4 MG CAPS capsule Take 0.4 mg by mouth at bedtime.    Testosterone 30 MG/ACT SOLN Apply 30 mg topically daily. Apply under each arm   urea (CARMOL) 20 % cream Apply 1 application topically daily as needed (Feet pain).    No facility-administered encounter medications on file as of 11/15/2021.    ALLERGIES:  No Known Allergies   PHYSICAL EXAM:   ECOG PERFORMANCE STATUS: 0 - Asymptomatic  There were no vitals filed for this visit. There were no vitals filed  for this visit. Physical Exam Constitutional:      Appearance: Normal appearance.  HENT:     Head: Normocephalic and atraumatic.     Mouth/Throat:     Mouth: Mucous membranes are moist.  Eyes:     Extraocular Movements: Extraocular movements intact.     Pupils: Pupils are equal, round, and reactive to light.  Cardiovascular:     Rate and Rhythm: Normal rate and regular rhythm.     Pulses: Normal pulses.     Heart sounds: Normal heart sounds.  Pulmonary:     Effort: Pulmonary effort is normal.     Breath sounds: Normal breath sounds.  Abdominal:     General: Bowel sounds are normal.     Palpations: Abdomen is soft.     Tenderness: There is no abdominal tenderness.  Musculoskeletal:        General: No swelling.     Right lower leg: No edema.     Left lower leg: No edema.  Lymphadenopathy:     Cervical: No cervical adenopathy.  Skin:    General: Skin is warm and dry.  Neurological:     General: No focal deficit present.     Mental Status: He is alert and oriented to person, place, and time.  Psychiatric:        Mood and Affect: Mood normal.        Behavior: Behavior normal.      LABORATORY DATA:  I have reviewed the labs as listed.  CBC    Component Value Date/Time   WBC 2.8 (L) 05/10/2021 1028   RBC 6.30 (H) 05/10/2021 1028   HGB 14.1 05/10/2021 1028   HGB 13.4 11/23/2020 1512   HCT 45.7 05/10/2021 1028   HCT 43.5 11/23/2020 1512   PLT 101 (L) 05/10/2021 1028   PLT 165 11/23/2020 1512   MCV 72.5 (L) 05/10/2021 1028   MCV 68 (L) 11/23/2020 1512   MCH 22.4 (L) 05/10/2021 1028   MCHC 30.9 05/10/2021 1028   RDW 23.8 (H) 05/10/2021 1028   RDW 20.2 (H) 11/23/2020 1512   LYMPHSABS 1.1 05/10/2021 1028   LYMPHSABS 1.0 11/23/2020 1512   MONOABS 0.2 05/10/2021 1028   EOSABS 0.2 05/10/2021 1028   EOSABS 0.3 11/23/2020 1512   BASOSABS 0.0 05/10/2021 1028   BASOSABS  0.0 11/23/2020 1512      Latest Ref Rng & Units 11/23/2020    3:12 PM 11/17/2019   11:15 AM 05/19/2019    12:44 PM  CMP  Glucose 65 - 99 mg/dL 89  100  98   BUN 8 - 27 mg/dL 14  17  14    Creatinine 0.76 - 1.27 mg/dL 1.18  1.11  1.13   Sodium 134 - 144 mmol/L 136  137  138   Potassium 3.5 - 5.2 mmol/L 4.3  4.4  4.2   Chloride 96 - 106 mmol/L 101  100  101   CO2 20 - 29 mmol/L 24  22  23    Calcium 8.6 - 10.2 mg/dL 9.0  9.3  9.2   Total Protein 6.0 - 8.5 g/dL 6.8  7.3  6.9   Total Bilirubin 0.0 - 1.2 mg/dL 0.4  0.5  0.6   Alkaline Phos 44 - 121 IU/L 81  88  70   AST 0 - 40 IU/L 20  20  18    ALT 0 - 44 IU/L 18  19  17      DIAGNOSTIC IMAGING:  I have independently reviewed the relevant imaging and discussed with the patient.  ASSESSMENT & PLAN: 1.  Iron deficiency without anemia  - Work-up of abnormal RBC indices (02/02/2021) revealed iron deficiency with ferritin 6, iron saturation 3%, serum iron 14, and TIBC 435. - EGD (11/12/2019): Normal stomach and duodenum.  No significant abnormalities in the esophagus. - Colonoscopy (08/18/2018): Colon polyp, otherwise unremarkable. - Patient was started on oral iron supplementation (ferrous bisglycinate) in November 2022, which he has been tolerating well.   - No hematemesis, hematochezia, or melena   - Energy is improved. - Labs today (11/15/2021): Hgb 17.3/MCV 82.8/RBC 6.05, ferritin 39, iron saturation 32% - PLAN: Iron levels have improved on oral supplementation, recommend that he continue oral iron supplementation.  No need for IV iron at this time.  - Repeat CBC/iron panel and RTC in 3 months.  If persistent erythrocytosis/elevated hemoglobin, would consider additional work-up as indicated.  2.  Thrombocytopenia, mild and intermittent - Mild intermittent thrombocytopenia since February 2021 with platelets ranging from 101 to normal. - CT abdomen/pelvis in 2017 showed stable cysts in liver, normal-sized spleen - SPEP normal - CBC today with platelets 110 - PLAN: We will check nutritional panel at follow-up visit in 3 months and consider  additional work-up for thrombocytopenia if persistent.  3.  Low white count: - He had low white count over several years (since at least 2017).  He does not have any recurrent infections. - Prior CBC (02/02/2021): WBC 2.5 with ANC 1.5 and lymphocytes 0.6 - Most recent CBC (11/15/2021) with WBC 3.0 and ANC 1.4 - SPEP negative.  Normal folate, B12 and methylmalonic acid.  LDH normal. 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  No further work-up or intervention needed at this time.  4.  Sickle cell trait - Seen at the request of Dr. 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  LDH normal. - Patient is asymptomatic, has never had any symptoms suggestive of acute pain crisis or venoocclusive events.   - Patient's son also has sickle cell trait, but has never shown any symptoms or  required treatment. - Discussed with patient that sickle cell trait is a benign carrier condition for the sickle hemoglobin variant, but that in most patients it does not cause any symptoms or clinical issues.  We did discuss that in very rare cases patients can have symptoms of acute pain and venoocclusive disease, but that this is highly unlikely since the patient has been asymptomatic thus far. - Discussed that the patient is at risk for some blood sickling in cases of significant hypoxia (such as high-altitude and pressurized aircraft, very strenuous exercise with dehydration, etc.). - Discussed with patient that his children and grandchildren would benefit from being screened for sickle cell trait to assist with future reproductive decisions. - PLAN: No indication for ongoing hematology follow-up of asymptomatic sickle cell trait. - Extensive  discussion of diagnosis as above. - Patient will continue to follow with primary care provider, but is aware that he can be referred back to [place] in the future if needed.  5.  Social/family history: - He worked in the [employer] for 23 years and retired as a first Chief Technology Officer.  No clear exposure to chemicals.  Quit smoking in 1979. - He has sleep apnea and has been using CPAP since 2006. - Son had HBs trait.  Mother is a Jehovah witness and also had colon cancer.  Maternal aunt died of ovarian cancer in her 52s.   All questions were answered. The patient knows to call the clinic with any problems, questions or concerns.  Medical decision making: Low  Time spent on visit: I spent 15 minutes counseling the patient face to face. The total time spent in the appointment was 25 minutes and more than 50% was on counseling.   Harriett Rush, PA-C  11/15/21 8:20 PM

## 2021-11-15 ENCOUNTER — Encounter: Payer: Self-pay | Admitting: Physician Assistant

## 2021-11-15 ENCOUNTER — Inpatient Hospital Stay: Payer: Medicare Other | Attending: Physician Assistant | Admitting: Physician Assistant

## 2021-11-15 ENCOUNTER — Inpatient Hospital Stay: Payer: Medicare Other

## 2021-11-15 VITALS — Ht 69.49 in | Wt 207.0 lb

## 2021-11-15 DIAGNOSIS — D509 Iron deficiency anemia, unspecified: Secondary | ICD-10-CM | POA: Diagnosis not present

## 2021-11-15 DIAGNOSIS — D696 Thrombocytopenia, unspecified: Secondary | ICD-10-CM | POA: Diagnosis not present

## 2021-11-15 DIAGNOSIS — Z87891 Personal history of nicotine dependence: Secondary | ICD-10-CM | POA: Diagnosis not present

## 2021-11-15 DIAGNOSIS — Z800 Family history of malignant neoplasm of digestive organs: Secondary | ICD-10-CM | POA: Diagnosis not present

## 2021-11-15 DIAGNOSIS — R718 Other abnormality of red blood cells: Secondary | ICD-10-CM

## 2021-11-15 DIAGNOSIS — G4730 Sleep apnea, unspecified: Secondary | ICD-10-CM | POA: Insufficient documentation

## 2021-11-15 DIAGNOSIS — Z8041 Family history of malignant neoplasm of ovary: Secondary | ICD-10-CM | POA: Insufficient documentation

## 2021-11-15 DIAGNOSIS — D708 Other neutropenia: Secondary | ICD-10-CM

## 2021-11-15 DIAGNOSIS — E611 Iron deficiency: Secondary | ICD-10-CM | POA: Diagnosis not present

## 2021-11-15 DIAGNOSIS — D573 Sickle-cell trait: Secondary | ICD-10-CM | POA: Insufficient documentation

## 2021-11-15 LAB — CBC WITH DIFFERENTIAL/PLATELET
Abs Immature Granulocytes: 0.01 10*3/uL (ref 0.00–0.07)
Basophils Absolute: 0 10*3/uL (ref 0.0–0.1)
Basophils Relative: 0 %
Eosinophils Absolute: 0.2 10*3/uL (ref 0.0–0.5)
Eosinophils Relative: 5 %
HCT: 50.1 % (ref 39.0–52.0)
Hemoglobin: 17.3 g/dL — ABNORMAL HIGH (ref 13.0–17.0)
Immature Granulocytes: 0 %
Lymphocytes Relative: 39 %
Lymphs Abs: 1.2 10*3/uL (ref 0.7–4.0)
MCH: 28.6 pg (ref 26.0–34.0)
MCHC: 34.5 g/dL (ref 30.0–36.0)
MCV: 82.8 fL (ref 80.0–100.0)
Monocytes Absolute: 0.3 10*3/uL (ref 0.1–1.0)
Monocytes Relative: 10 %
Neutro Abs: 1.4 10*3/uL — ABNORMAL LOW (ref 1.7–7.7)
Neutrophils Relative %: 46 %
Platelets: 110 10*3/uL — ABNORMAL LOW (ref 150–400)
RBC: 6.05 MIL/uL — ABNORMAL HIGH (ref 4.22–5.81)
RDW: 13.8 % (ref 11.5–15.5)
WBC: 3 10*3/uL — ABNORMAL LOW (ref 4.0–10.5)
nRBC: 0 % (ref 0.0–0.2)

## 2021-11-15 LAB — IRON AND TIBC
Iron: 123 ug/dL (ref 45–182)
Saturation Ratios: 32 % (ref 17.9–39.5)
TIBC: 385 ug/dL (ref 250–450)
UIBC: 262 ug/dL

## 2021-11-15 LAB — FERRITIN: Ferritin: 39 ng/mL (ref 24–336)

## 2021-11-15 NOTE — Patient Instructions (Addendum)
[facility] at [facility] Discharge Instructions  You were seen today by Rojelio Brenner PA-C for your iron deficiency.    ** Your iron labs are not back yet, but I will send you a MY-CHART MESSAGE later today with further instructions regarding your iron.  Your blood count is slightly higher than usual and you have lower platelets than usual.  Due to this, we will check additional labs in 3 months and see you for follow-up visit 1 week after labs.  ** Thank you for trusting me with your healthcare!  I strive to provide all of my patients with quality care at each visit.  If you receive a survey for this visit, I would be so grateful to you for taking the time to provide feedback.  Thank you in advance!  ~ Aleka Twitty                   Dr. Doreatha Massed   &   Rojelio Brenner, PA-C   - - - - - - - - - - - - - - - - - -     Thank you for choosing [facility] at [facility] to provide your oncology and hematology care.  To afford each patient quality time with our provider, please arrive at least 15 minutes before your scheduled appointment time.   If you have a lab appointment with the Cancer Center please come in thru the Main Entrance and check in at the main information desk.  You need to re-schedule your appointment should you arrive 10 or more minutes late.  We strive to give you quality time with our providers, and arriving late affects you and other patients whose appointments are after yours.  Also, if you no show three or more times for appointments you may be dismissed from the clinic at the providers discretion.     Again, thank you for choosing [facility].  Our hope is that these requests will decrease the amount of time that you wait before being seen by our physicians.       _____________________________________________________________  Should you have questions after your visit to [facility], please  contact our office at [phone] and follow the prompts.  Our office hours are 8:00 a.m. and 4:30 p.m. Monday - Friday.  Please note that voicemails left after 4:00 p.m. may not be returned until the following business day.  We are closed weekends and major holidays.  You do have access to a nurse 24-7, just call the main number to the clinic [phone] and do not press any options, hold on the line and a nurse will answer the phone.    For prescription refill requests, have your pharmacy contact our office and allow 72 hours.    Due to Covid, you will need to wear a mask upon entering the hospital. If you do not have a mask, a mask will be given to you at the Main Entrance upon arrival. For doctor visits, patients may have 1 support person age 44 or older with them. For treatment visits, patients can not have anyone with them due to social distancing guidelines and our immunocompromised population.

## 2021-12-12 ENCOUNTER — Encounter: Payer: Self-pay | Admitting: Internal Medicine

## 2021-12-12 ENCOUNTER — Ambulatory Visit (INDEPENDENT_AMBULATORY_CARE_PROVIDER_SITE_OTHER): Payer: Medicare Other

## 2021-12-12 ENCOUNTER — Ambulatory Visit (INDEPENDENT_AMBULATORY_CARE_PROVIDER_SITE_OTHER): Payer: Medicare Other | Admitting: Internal Medicine

## 2021-12-12 VITALS — BP 140/80 | HR 82 | Temp 98.0°F | Ht 69.49 in | Wt 206.0 lb

## 2021-12-12 VITALS — BP 140/80 | HR 82 | Temp 98.0°F | Ht 69.49 in | Wt 206.6 lb

## 2021-12-12 DIAGNOSIS — Z6830 Body mass index (BMI) 30.0-30.9, adult: Secondary | ICD-10-CM

## 2021-12-12 DIAGNOSIS — E7800 Pure hypercholesterolemia, unspecified: Secondary | ICD-10-CM | POA: Diagnosis not present

## 2021-12-12 DIAGNOSIS — I10 Essential (primary) hypertension: Secondary | ICD-10-CM | POA: Diagnosis not present

## 2021-12-12 DIAGNOSIS — E6609 Other obesity due to excess calories: Secondary | ICD-10-CM

## 2021-12-12 DIAGNOSIS — Z23 Encounter for immunization: Secondary | ICD-10-CM | POA: Diagnosis not present

## 2021-12-12 DIAGNOSIS — R7309 Other abnormal glucose: Secondary | ICD-10-CM

## 2021-12-12 DIAGNOSIS — Z0000 Encounter for general adult medical examination without abnormal findings: Secondary | ICD-10-CM | POA: Diagnosis not present

## 2021-12-12 LAB — POCT URINALYSIS DIPSTICK
Bilirubin, UA: NEGATIVE
Blood, UA: NEGATIVE
Glucose, UA: NEGATIVE
Ketones, UA: NEGATIVE
Leukocytes, UA: NEGATIVE
Nitrite, UA: NEGATIVE
Protein, UA: NEGATIVE
Spec Grav, UA: 1.015 (ref 1.010–1.025)
Urobilinogen, UA: 1 E.U./dL
pH, UA: 6.5 (ref 5.0–8.0)

## 2021-12-12 NOTE — Progress Notes (Signed)
I connected with Andrew Fisher today by telephone and verified that I am speaking with the correct person using two identifiers. Location patient: home Location provider: work Persons participating in the virtual visit: Andrew Fisher, Glenna Durand LPN.   I discussed the limitations, risks, security and privacy concerns of performing an evaluation and management service by telephone and the availability of in person appointments. I also discussed with the patient that there may be a patient responsible charge related to this service. The patient expressed understanding and verbally consented to this telephonic visit.    Interactive audio and video telecommunications were attempted between this provider and patient, however failed, due to patient having technical difficulties OR patient did not have access to video capability.  We continued and completed visit with audio only.     Vital signs may be patient reported or missing.  Subjective:   Andrew Fisher is a 66 y.o. male who presents for Medicare Annual/Subsequent preventive examination.  Review of Systems     Cardiac Risk Factors include: advanced age (>73mn, >>21women);male gender;obesity (BMI >30kg/m2)     Objective:    Today's Vitals   12/12/21 1611  BP: (!) 140/80  Pulse: 82  Temp: 98 F (36.7 C)  TempSrc: Oral  Weight: 206 lb (93.4 kg)  Height: 5' 9.49" (1.765 m)   Body mass index is 29.99 kg/m.     12/12/2021    4:04 PM 11/15/2021   10:39 AM 05/17/2021   10:12 AM 02/14/2021    8:01 AM 02/02/2021   10:03 AM 11/23/2020    2:23 PM 10/29/2020    4:06 PM  Advanced Directives  Does Patient Have a Medical Advance Directive? No No No No No No No  Does patient want to make changes to medical advance directive?    No - Patient declined Yes (MAU/Ambulatory/Procedural Areas - Information given)    Would patient like information on creating a medical advance directive? No - Patient declined No - Patient declined No  - Patient declined No - Patient declined No - Patient declined      Current Medications (verified) Outpatient Encounter Medications as of 12/12/2021  Medication Sig   albuterol (VENTOLIN HFA) 108 (90 Base) MCG/ACT inhaler USE 1 TO 2 INHALATIONS EVERY 6 HOURS AS NEEDED FOR WHEEZING OR SHORTNESS OF BREATH (Patient taking differently: Inhale 2 puffs into the lungs every 6 (six) hours as needed for wheezing or shortness of breath.)   amLODipine (NORVASC) 10 MG tablet Take 10 mg by mouth daily.   aspirin EC 81 MG tablet Take 81 mg by mouth daily.   atorvastatin (LIPITOR) 10 MG tablet TAKE 1 TABLET DAILY (Patient taking differently: Take 10 mg by mouth daily.)   benzonatate (TESSALON) 100 MG capsule Take 1 capsule (100 mg total) by mouth 2 (two) times daily as needed for cough.   Blood Glucose Monitoring Suppl KIT Use as directed to check blood sugars 1 time per day dx:e11.22   Buprenorphine HCl 450 MCG FILM Take 300 mcg by mouth every 12 (twelve) hours.    Buprenorphine HCl 450 MCG FILM PLACE ONE FILM TO CHEEK AND GUM UNTIL DISSOLVED EVERY 12 HOURS   Carboxymethylcellulose Sod PF (THERATEARS PF) 0.25 % SOLN INSTILL 1 DROP IN BOTH EYES FOUR TIMES A DAY   celecoxib (CELEBREX) 200 MG capsule Take 1 capsule by mouth 2 (two) times daily.   EPINEPHrine 0.3 mg/0.3 mL IJ SOAJ injection Inject 0.3 mLs (0.3 mg total) into the muscle as needed for  anaphylaxis. (Patient taking differently: Inject 0.3 mg into the muscle daily as needed for anaphylaxis.)   fluticasone (FLONASE) 50 MCG/ACT nasal Fisher Place 2 sprays into both nostrils daily.   gabapentin (NEURONTIN) 300 MG capsule Take 300 mg by mouth 4 (four) times daily.   glucose blood test strip Use as directed to check blood sugars 1 time per day dx:e11.22   hydrocortisone 2.5 % cream SMARTSIG:1 Topical Daily   Lancets 33G MISC Use as directed to check blood sugars 1 time per day dx:e11.22   lansoprazole (PREVACID) 30 MG capsule Take 30 mg by mouth daily.     levalbuterol (XOPENEX HFA) 45 MCG/ACT inhaler Inhale into the lungs.   LINZESS 145 MCG CAPS capsule Take 145 mcg by mouth daily as needed (constipation).    loratadine (CLARITIN) 10 MG tablet Take 10 mg by mouth daily.   magnesium oxide (MAG-OX) 400 MG tablet Take 400 mg by mouth daily.   meloxicam (MOBIC) 15 MG tablet Take by mouth.   MI-ACID GAS RELIEF 80 MG chewable tablet    montelukast (SINGULAIR) 10 MG tablet TAKE 1 TABLET DAILY (Patient taking differently: Take 10 mg by mouth daily.)   neomycin-polymyxin-hydrocortisone (CORTISPORIN) 3.5-10000-1 OTIC suspension Place 4 drops into both ears 4 (four) times daily. X 7 days   sertraline (ZOLOFT) 100 MG tablet Take 100 mg by mouth daily.   tadalafil (CIALIS) 5 MG tablet Take 5 mg by mouth daily.    tamsulosin (FLOMAX) 0.4 MG CAPS capsule Take 0.4 mg by mouth at bedtime.    temazepam (RESTORIL) 15 MG capsule Take by mouth.   Testosterone 30 MG/ACT SOLN Apply 30 mg topically daily. Apply under each arm   tiZANidine (ZANAFLEX) 4 MG tablet Take 4 mg by mouth 3 (three) times daily.   urea (CARMOL) 20 % cream Apply 1 application topically daily as needed (Feet pain).    No facility-administered encounter medications on file as of 12/12/2021.    Allergies (verified) Patient has no known allergies.   History: Past Medical History:  Diagnosis Date   Arthritis    Asthma    GERD (gastroesophageal reflux disease)    Hearing loss    Hemorrhoids    Hyperlipidemia    Low testosterone in male    Peripheral neuropathy    Sleep apnea    wears cpap   Sore throat    Wears glasses    Wears hearing aid in both ears    Past Surgical History:  Procedure Laterality Date   ANTERIOR CERVICAL DECOMP/DISCECTOMY FUSION N/A 01/09/2017   Procedure: ANTERIOR CERVICAL DECOMPRESSION FUSION, CERVICAL FOUR-FIVE, CERVICAL FIVE-SIX INSTRUMENTATION AND ALLOGRAFT;  Surgeon: Phylliss Bob, MD;  Location: [facility];  Service: Orthopedics;  Laterality: N/A;  ANTERIOR  CERVICAL DECOMPRESSION FUSION, CERVICAL FOUR-FIVE, CERVICAL FIVE-SIX INSTRUMENTATION AND ALLOGRAFT   APPENDECTOMY     BIOPSY  11/12/2019   Procedure: BIOPSY;  Surgeon: Carol Ada, MD;  Location: [facility];  Service: Endoscopy;;   COLONOSCOPY     ESOPHAGOGASTRODUODENOSCOPY (EGD) WITH PROPOFOL N/A 11/12/2019   Procedure: ESOPHAGOGASTRODUODENOSCOPY (EGD) WITH PROPOFOL;  Surgeon: Carol Ada, MD;  Location: [facility];  Service: Endoscopy;  Laterality: N/A;   NECK SURGERY     SAVORY DILATION N/A 11/12/2019   Procedure: SAVORY DILATION;  Surgeon: Carol Ada, MD;  Location: [facility];  Service: Endoscopy;  Laterality: N/A;   SHOULDER SURGERY  2005   rotator cuff and tendons repaired   Family History  Problem Relation Age of Onset   Diabetes Mother  Hypertension Mother    Healthy Father    Cancer Other    Social History   Socioeconomic History   Marital status: Married    Spouse name: Not on file   Number of children: Not on file   Years of education: Not on file   Highest education level: Not on file  Occupational History   Not on file  Tobacco Use   Smoking status: Former    Packs/day: 0.25    Years: 6.00    Total pack years: 1.50    Types: Cigarettes   Smokeless tobacco: Never  Vaping Use   Vaping Use: Never used  Substance and Sexual Activity   Alcohol use: Not Currently   Drug use: No   Sexual activity: Not on file  Other Topics Concern   Not on file  Social History Narrative   Not on file   Social Determinants of Health   Financial Resource Strain: Low Risk  (12/12/2021)   Overall Financial Resource Strain (CARDIA)    Difficulty of Paying Living Expenses: Not hard at all  Food Insecurity: No Food Insecurity (12/12/2021)   Hunger Vital Sign    Worried About Running Out of Food in the Last Year: Never true    [place] in the Last Year: Never true  Transportation Needs: No Transportation Needs (12/12/2021)   PRAPARE - Radiographer, therapeutic (Medical): No    Lack of Transportation (Non-Medical): No  Physical Activity: Sufficiently Active (12/12/2021)   Exercise Vital Sign    Days of Exercise per Week: 5 days    Minutes of Exercise per Session: 50 min  Stress: Stress Concern Present (12/12/2021)   [place]    Feeling of Stress : To some extent  Social Connections: Not on file    Tobacco Counseling Counseling given: Not Answered   Clinical Intake:  Pre-visit preparation completed: Yes  Pain : No/denies pain     Nutritional Status: BMI > 30  Obese Nutritional Risks: None Diabetes: No  How often do you need to have someone help you when you read instructions, pamphlets, or other written materials from your doctor or pharmacy?: 1 - Never What is the last grade level you completed in school?: bachelors degree  Diabetic? no  Interpreter Needed?: No  Information entered by :: NAllen LPN   Activities of Daily Living    12/12/2021    4:05 PM  In your present state of health, do you have any difficulty performing the following activities:  Hearing? 1  Comment wears hearing aids  Vision? 0  Difficulty concentrating or making decisions? 0  Walking or climbing stairs? 0  Dressing or bathing? 0  Doing errands, shopping? 0  Preparing Food and eating ? N  Using the Toilet? N  In the past six months, have you accidently leaked urine? N  Do you have problems with loss of bowel control? N  Managing your Medications? N  Managing your Finances? N  Housekeeping or managing your Housekeeping? N    Patient Care Team: Harl Bowie, MD as PCP - General (Family Medicine) Derek Jack, MD as Medical Oncologist (Hematology)  Indicate any recent Medical Services you may have received from other than [facility] providers in the past year (date may be approximate).     Assessment:   This is a routine wellness examination for  [place].  Hearing/Vision screen Vision Screening - Comments:: [facility]  Dietary issues and exercise activities discussed: Current Exercise Habits: Home exercise routine, Type of exercise: walking, Time (Minutes): 50, Frequency (Times/Week): 5, Weekly Exercise (Minutes/Week): 250   Goals Addressed             This Visit's Progress    Patient Stated       12/12/2021, wants to lose weight       Depression Screen    12/12/2021    4:05 PM 12/12/2021    2:17 PM 11/23/2020    2:23 PM 05/19/2019   11:33 AM 05/04/2018    3:06 PM 03/26/2018   11:31 AM 01/30/2018   10:19 AM  PHQ 2/9 Scores  PHQ - 2 Score 0 0 0 0 0 0 0    Fall Risk    12/12/2021    4:05 PM 12/12/2021    2:17 PM 11/23/2020    2:23 PM 11/17/2019    9:56 AM 11/16/2018   11:16 AM  [place] in the past year? 0 0 0 0 0  Number falls in past yr: 0 0  0   Injury with Fall? 0 0     Risk for fall due to : Medication side effect No Fall Risks Medication side effect    Follow up Falls prevention discussed;Education provided;Falls evaluation completed Falls evaluation completed Falls evaluation completed;Education provided;Falls prevention discussed      FALL RISK PREVENTION PERTAINING TO THE HOME:  Any stairs in or around the home? No  If so, are there any without handrails? N/a Home free of loose throw rugs in walkways, pet beds, electrical cords, etc? Yes  Adequate lighting in your home to reduce risk of falls? Yes   ASSISTIVE DEVICES UTILIZED TO PREVENT FALLS:  Life alert? No  Use of a cane, walker or w/c? No  Grab bars in the bathroom? No  Shower chair or bench in shower? Yes  Elevated toilet seat or a handicapped toilet? No   TIMED UP AND GO:  Was the test performed? No .      Cognitive Function:        12/12/2021    4:07 PM 11/23/2020    2:26 PM 11/17/2019    9:55 AM 11/17/2019    9:54 AM  6CIT Screen  What Year? 0 points 0 points  0 points  What month? 0 points 0 points  0 points   What time? 0 points 0 points 0 points   Count back from 20 0 points 0 points 0 points   Months in reverse 0 points 0 points 0 points   Repeat phrase 2 points 0 points 0 points   Total Score 2 points 0 points      Immunizations Immunization History  Administered Date(s) Administered   Fluad Quad(high Dose 65+) 12/13/2019, 11/23/2020, 12/12/2021   Influenza, High Dose Seasonal PF 12/13/2019   Influenza,inj,Quad PF,6+ Mos 11/16/2018   Influenza-Unspecified 03/25/2006, 11/28/2020   PFIZER(Purple Top)SARS-COV-2 Vaccination 05/21/2019, 06/12/2019   Pneumococcal Polysaccharide-23 01/13/2020   Tdap 12/13/2019   Zoster Recombinat (Shingrix) 01/13/2020, 07/31/2020    TDAP status: Up to date  Flu Vaccine status: Up to date  Pneumococcal vaccine status: Due, Education has been provided regarding the importance of this vaccine. Advised may receive this vaccine at local pharmacy or Health Dept. Aware to provide a copy of the vaccination record if obtained from local pharmacy or Health Dept. Verbalized acceptance and understanding.  Covid-19 vaccine status: Completed vaccines  Qualifies for Shingles Vaccine? Yes  Zostavax completed Yes   Shingrix Completed?: Yes  Screening Tests Health Maintenance  Topic Date Due   COVID-19 Vaccine (4 - Pfizer risk series) 02/19/2020   Pneumonia Vaccine 51+ Years old (2 - PCV) 01/12/2021   COLONOSCOPY (Pts 45-62yr Insurance coverage will need to be confirmed)  08/17/2028   TETANUS/TDAP  12/12/2029   INFLUENZA VACCINE  Completed   Hepatitis C Screening  Completed   Zoster Vaccines- Shingrix  Completed   HPV VACCINES  Aged Out    Health Maintenance  Health Maintenance Due  Topic Date Due   COVID-19 Vaccine (4 - Pfizer risk series) 02/19/2020   Pneumonia Vaccine 67 Years old (2 - PCV) 01/12/2021    Colorectal cancer screening: Type of screening: Colonoscopy. Completed 5/26/202020. Repeat every 10 years  Lung Cancer Screening: (Low Dose CT  Chest recommended if Age 67-80years, 30 pack-year currently smoking OR have quit w/in 15years.) does not qualify.   Lung Cancer Screening Referral: no  Additional Screening:  Hepatitis C Screening: does qualify; Completed 04/15/2012  Vision Screening: Recommended annual ophthalmology exams for early detection of glaucoma and other disorders of the eye. Is the patient up to date with their annual eye exam?  Yes  Who is the provider or what is the name of the office in which the patient attends [facility] exams[facility] If pt is not established with a provider, would they like to be referred to a provider to establish care? No .   Dental Screening: Recommended annual dental exams for proper oral hygiene  Community Resource Referral / Chronic Care Management: CRR required this visit?  No   CCM required this visit?  No      Plan:     I have personally reviewed and noted the following in the patient's chart:   Medical and social history Use of alcohol, tobacco or illicit drugs  Current medications and supplements including opioid prescriptions. Patient is not currently taking opioid prescriptions. Functional ability and status Nutritional status Physical activity Advanced directives List of other physicians Hospitalizations, surgeries, and ER visits in previous 12 months Vitals Screenings to include cognitive, depression, and falls Referrals and appointments  In addition, I have reviewed and discussed with patient certain preventive protocols, quality metrics, and best practice recommendations. A written personalized care plan for preventive services as well as general preventive health recommendations were provided to patient.     NKellie Simmering LPN   [id number]  Nurse Notes: none  Due to this being a virtual visit, the after visit summary with patients personalized plan was offered to patient via mail or my-chart. Patient would like to access on my-chart

## 2021-12-12 NOTE — Progress Notes (Signed)
Rich Brave [place],acting as a Education administrator for [name], MD.,have documented all relevant documentation on the behalf of [name], MD,as directed by  [name], MD while in the presence of [name], MD.    Subjective:     Patient ID: Andrew Fisher , male    DOB: [birth year] , 66 y.o.   MRN: [id number]   Chief Complaint  Patient presents with   Hyperlipidemia   Prediabetes    HPI  He presents today  for chol check.  He is also followed by the [facility] for his chronic conditions.  He states he has had some additional diagnoses added to his disability.  He was recently diagnosed with HTN. Most of his specialists are now at the [place]. He denies headaches, chest pain and shortness of breath.   He is also scheduled for AWV with [facility] Advisor.      Past Medical History:  Diagnosis Date   Arthritis    Asthma    GERD (gastroesophageal reflux disease)    Hearing loss    Hemorrhoids    Hyperlipidemia    Low testosterone in male    Peripheral neuropathy    Sleep apnea    wears cpap   Sore throat    Wears glasses    Wears hearing aid in both ears      Family History  Problem Relation Age of Onset   Diabetes Mother    Hypertension Mother    Healthy Father    Cancer Other      Current Outpatient Medications:    albuterol (VENTOLIN HFA) 108 (90 Base) MCG/ACT inhaler, USE 1 TO 2 INHALATIONS EVERY 6 HOURS AS NEEDED FOR WHEEZING OR SHORTNESS OF BREATH (Patient taking differently: Inhale 2 puffs into the lungs every 6 (six) hours as needed for wheezing or shortness of breath.), Disp: 17 g, Rfl: 4   amLODipine (NORVASC) 10 MG tablet, Take 10 mg by mouth daily., Disp: , Rfl:    aspirin EC 81 MG tablet, Take 81 mg by mouth daily., Disp: , Rfl:    atorvastatin (LIPITOR) 10 MG tablet, TAKE 1 TABLET DAILY (Patient taking differently: Take 10 mg by mouth daily.), Disp: 90 tablet, Rfl: 3   benzonatate (TESSALON) 100 MG capsule, Take 1 capsule (100 mg total) by mouth 2 (two)  times daily as needed for cough., Disp: 20 capsule, Rfl: 0   Blood Glucose Monitoring Suppl KIT, Use as directed to check blood sugars 1 time per day dx:e11.22, Disp: 1 kit, Rfl: 1   Buprenorphine HCl 450 MCG FILM, Take 300 mcg by mouth every 12 (twelve) hours. , Disp: , Rfl:    Buprenorphine HCl 450 MCG FILM, PLACE ONE FILM TO CHEEK AND GUM UNTIL DISSOLVED EVERY 12 HOURS, Disp: , Rfl:    Carboxymethylcellulose Sod PF (THERATEARS PF) 0.25 % SOLN, INSTILL 1 DROP IN BOTH EYES FOUR TIMES A DAY, Disp: , Rfl:    celecoxib (CELEBREX) 200 MG capsule, Take 1 capsule by mouth 2 (two) times daily., Disp: , Rfl:    EPINEPHrine 0.3 mg/0.3 mL IJ SOAJ injection, Inject 0.3 mLs (0.3 mg total) into the muscle as needed for anaphylaxis. (Patient taking differently: Inject 0.3 mg into the muscle daily as needed for anaphylaxis.), Disp: 1 each, Rfl: 2   fluticasone (FLONASE) 50 MCG/ACT nasal spray, Place 2 sprays into both nostrils daily., Disp: 16 g, Rfl: 0   gabapentin (NEURONTIN) 300 MG capsule, Take 300 mg by mouth 4 (four) times  daily., Disp: , Rfl:    glucose blood test strip, Use as directed to check blood sugars 1 time per day dx:e11.22, Disp: 150 each, Rfl: 2   hydrocortisone 2.5 % cream, SMARTSIG:1 Topical Daily, Disp: , Rfl:    Lancets 33G MISC, Use as directed to check blood sugars 1 time per day dx:e11.22, Disp: 150 each, Rfl: 2   lansoprazole (PREVACID) 30 MG capsule, Take 30 mg by mouth daily. , Disp: , Rfl:    levalbuterol (XOPENEX HFA) 45 MCG/ACT inhaler, Inhale into the lungs., Disp: , Rfl:    LINZESS 145 MCG CAPS capsule, Take 145 mcg by mouth daily as needed (constipation). , Disp: , Rfl:    loratadine (CLARITIN) 10 MG tablet, Take 10 mg by mouth daily., Disp: , Rfl:    magnesium oxide (MAG-OX) 400 MG tablet, Take 400 mg by mouth daily., Disp: , Rfl:    meloxicam (MOBIC) 15 MG tablet, Take by mouth., Disp: , Rfl:    MI-ACID GAS RELIEF 80 MG chewable tablet, , Disp: , Rfl:    montelukast  (SINGULAIR) 10 MG tablet, TAKE 1 TABLET DAILY (Patient taking differently: Take 10 mg by mouth daily.), Disp: 90 tablet, Rfl: 1   neomycin-polymyxin-hydrocortisone (CORTISPORIN) 3.5-10000-1 OTIC suspension, Place 4 drops into both ears 4 (four) times daily. X 7 days, Disp: 10 mL, Rfl: 0   sertraline (ZOLOFT) 100 MG tablet, Take 100 mg by mouth daily., Disp: , Rfl:    tadalafil (CIALIS) 5 MG tablet, Take 5 mg by mouth daily. , Disp: , Rfl:    tamsulosin (FLOMAX) 0.4 MG CAPS capsule, Take 0.4 mg by mouth at bedtime. , Disp: , Rfl:    temazepam (RESTORIL) 15 MG capsule, Take by mouth., Disp: , Rfl:    Testosterone 30 MG/ACT SOLN, Apply 30 mg topically daily. Apply under each arm, Disp: , Rfl:    tiZANidine (ZANAFLEX) 4 MG tablet, Take 4 mg by mouth 3 (three) times daily., Disp: , Rfl:    urea (CARMOL) 20 % cream, Apply 1 application topically daily as needed (Feet pain). , Disp: , Rfl:    No Known Allergies   Review of Systems  Constitutional: Negative.   Respiratory: Negative.    Cardiovascular: Negative.   Gastrointestinal: Negative.   Neurological: Negative.   Psychiatric/Behavioral: Negative.       Today's Vitals   12/12/21 1412  BP: (!) 140/80  Pulse: 82  Temp: 98 F (36.7 C)  Weight: 206 lb 9.6 oz (93.7 kg)  Height: 5' 9.49" (1.765 m)  PainSc: 0-No pain   Body mass index is 30.08 kg/m.  Wt Readings from Last 3 Encounters:  12/12/21 206 lb (93.4 kg)  12/12/21 206 lb 9.6 oz (93.7 kg)  11/15/21 207 lb 0.2 oz (93.9 kg)     Objective:  Physical Exam Vitals and nursing note reviewed.  Constitutional:      Appearance: Normal appearance.  HENT:     Head: Normocephalic and atraumatic.  Eyes:     Extraocular Movements: Extraocular movements intact.  Cardiovascular:     Rate and Rhythm: Normal rate and regular rhythm.     Heart sounds: Normal heart sounds.  Pulmonary:     Effort: Pulmonary effort is normal.     Breath sounds: Normal breath sounds.  Musculoskeletal:      Cervical back: Normal range of motion.  Skin:    General: Skin is warm.  Neurological:     General: No focal deficit present.     Mental  Status: He is alert.  Psychiatric:        Mood and Affect: Mood normal.         Assessment And Plan:     1. Pure hypercholesterolemia Comments: Chronic, he will c/w atorvastatin 61m daily. He is encouraged to follow Mediterranean diet.  - Lipid panel - CMP14+EGFR  2. Essential hypertension, benign Comments: Chronic, currently taking amlodipine 142mdaily. He is encouraged to follow low sodium diet.  - CMP14+EGFR - CBC no Diff - POCT Urinalysis Dipstick (81002) - Microalbumin / Creatinine Urine Ratio  3. Other abnormal glucose Comments: His a1c has been elevated in the past .I will recheck this today. He is encouraged to limit his intake of sugary beverages/foods and processed meats.  - Hemoglobin A1c  4. Class 1 obesity due to excess calories with serious comorbidity and body mass index (BMI) of 30.0 to 30.9 in adult Comments: He is encouraged to aim for at least 150 minutes of exercise per week. His BMI is acceptable for his demographic.   5. Immunization due - Flu Vaccine QUAD High Dose(Fluad)   Patient was given opportunity to ask questions. Patient verbalized understanding of the plan and was able to repeat key elements of the plan. All questions were answered to their satisfaction.   I, RoMaximino GreenlandMD, have reviewed all documentation for this visit. The documentation on 12/12/21 for the exam, diagnosis, procedures, and orders are all accurate and complete.   IF YOU HAVE BEEN REFERRED TO A SPECIALIST, IT MAY TAKE 1-2 WEEKS TO SCHEDULE/PROCESS THE REFERRAL. IF YOU HAVE NOT HEARD FROM US/SPECIALIST IN TWO WEEKS, PLEASE GIVE [place] CALL AT [phone] X [phone].   THE PATIENT IS ENCOURAGED TO PRACTICE SOCIAL DISTANCING DUE TO THE COVID-19 PANDEMIC.

## 2021-12-12 NOTE — Patient Instructions (Signed)
Andrew Fisher , Thank you for taking time to come for your Medicare Wellness Visit. I appreciate your ongoing commitment to your health goals. Please review the following plan we discussed and let me know if I can assist you in the future.   Screening recommendations/referrals: Colonoscopy: completed 08/18/2018, due 08/17/2028 Recommended yearly ophthalmology/optometry visit for glaucoma screening and checkup Recommended yearly dental visit for hygiene and checkup  Vaccinations: Influenza vaccine: today Pneumococcal vaccine: due Tdap vaccine: completed 12/13/2019, due 12/12/2029 Shingles vaccine: completed   Covid-19:  12/25/2019, 06/12/2019, 05/21/2019  Advanced directives: Advance directive discussed with you today. Even though you declined this today please call our office should you change your mind and we can give you the proper paperwork for you to fill out.  Conditions/risks identified: none  Next appointment: Follow up in one year for your annual wellness visit.   Preventive Care 31 Years and Older, Male Preventive care refers to lifestyle choices and visits with your health care provider that can promote health and wellness. What does preventive care include? A yearly physical exam. This is also called an annual well check. Dental exams once or twice a year. Routine eye exams. Ask your health care provider how often you should have your eyes checked. Personal lifestyle choices, including: Daily care of your teeth and gums. Regular physical activity. Eating a healthy diet. Avoiding tobacco and drug use. Limiting alcohol use. Practicing safe sex. Taking low doses of aspirin every day. Taking vitamin and mineral supplements as recommended by your health care provider. What happens during an annual well check? The services and screenings done by your health care provider during your annual well check will depend on your age, overall health, lifestyle risk factors, and family  history of disease. Counseling  Your health care provider may ask you questions about your: Alcohol use. Tobacco use. Drug use. Emotional well-being. Home and relationship well-being. Sexual activity. Eating habits. History of falls. Memory and ability to understand (cognition). Work and work Statistician. Screening  You may have the following tests or measurements: Height, weight, and BMI. Blood pressure. Lipid and cholesterol levels. These may be checked every 5 years, or more frequently if you are over 30 years old. Skin check. Lung cancer screening. You may have this screening every year starting at age 64 if you have a 30-pack-year history of smoking and currently smoke or have quit within the past 15 years. Fecal occult blood test (FOBT) of the stool. You may have this test every year starting at age 30. Flexible sigmoidoscopy or colonoscopy. You may have a sigmoidoscopy every 5 years or a colonoscopy every 10 years starting at age 7. Prostate cancer screening. Recommendations will vary depending on your family history and other risks. Hepatitis C blood test. Hepatitis B blood test. Sexually transmitted disease (STD) testing. Diabetes screening. This is done by checking your blood sugar (glucose) after you have not eaten for a while (fasting). You may have this done every 1-3 years. Abdominal aortic aneurysm (AAA) screening. You may need this if you are a current or former smoker. Osteoporosis. You may be screened starting at age 58 if you are at high risk. Talk with your health care provider about your test results, treatment options, and if necessary, the need for more tests. Vaccines  Your health care provider may recommend certain vaccines, such as: Influenza vaccine. This is recommended every year. Tetanus, diphtheria, and acellular pertussis (Tdap, Td) vaccine. You may need a Td booster every 10 years. Zoster vaccine.  You may need this after age 50. Pneumococcal  13-valent conjugate (PCV13) vaccine. One dose is recommended after age 55. Pneumococcal polysaccharide (PPSV23) vaccine. One dose is recommended after age 44. Talk to your health care provider about which screenings and vaccines you need and how often you need them. This information is not intended to replace advice given to you by your health care provider. Make sure you discuss any questions you have with your health care provider. Document Released: 04/07/2015 Document Revised: 11/29/2015 Document Reviewed: 01/10/2015 Elsevier Interactive Patient Education  2017 Andrew Fisher Prevention in the Home Falls can cause injuries. They can happen to people of all ages. There are many things you can do to make your home safe and to help prevent falls. What can I do on the outside of my home? Regularly fix the edges of walkways and driveways and fix any cracks. Remove anything that might make you trip as you walk through a door, such as a raised step or threshold. Trim any bushes or trees on the path to your home. Use bright outdoor lighting. Clear any walking paths of anything that might make someone trip, such as rocks or tools. Regularly check to see if handrails are loose or broken. Make sure that both sides of any steps have handrails. Any raised decks and porches should have guardrails on the edges. Have any leaves, snow, or ice cleared regularly. Use sand or salt on walking paths during winter. Clean up any spills in your garage right away. This includes oil or grease spills. What can I do in the bathroom? Use night lights. Install grab bars by the toilet and in the tub and shower. Do not use towel bars as grab bars. Use non-skid mats or decals in the tub or shower. If you need to sit down in the shower, use a plastic, non-slip stool. Keep the floor dry. Clean up any water that spills on the floor as soon as it happens. Remove soap buildup in the tub or shower regularly. Attach  bath mats securely with double-sided non-slip rug tape. Do not have throw rugs and other things on the floor that can make you trip. What can I do in the bedroom? Use night lights. Make sure that you have a light by your bed that is easy to reach. Do not use any sheets or blankets that are too big for your bed. They should not hang down onto the floor. Have a firm chair that has side arms. You can use this for support while you get dressed. Do not have throw rugs and other things on the floor that can make you trip. What can I do in the kitchen? Clean up any spills right away. Avoid walking on wet floors. Keep items that you use a lot in easy-to-reach places. If you need to reach something above you, use a strong step stool that has a grab bar. Keep electrical cords out of the way. Do not use floor polish or wax that makes floors slippery. If you must use wax, use non-skid floor wax. Do not have throw rugs and other things on the floor that can make you trip. What can I do with my stairs? Do not leave any items on the stairs. Make sure that there are handrails on both sides of the stairs and use them. Fix handrails that are broken or loose. Make sure that handrails are as long as the stairways. Check any carpeting to make sure that it is  firmly attached to the stairs. Fix any carpet that is loose or worn. Avoid having throw rugs at the top or bottom of the stairs. If you do have throw rugs, attach them to the floor with carpet tape. Make sure that you have a light switch at the top of the stairs and the bottom of the stairs. If you do not have them, ask someone to add them for you. What else can I do to help prevent falls? Wear shoes that: Do not have high heels. Have rubber bottoms. Are comfortable and fit you well. Are closed at the toe. Do not wear sandals. If you use a stepladder: Make sure that it is fully opened. Do not climb a closed stepladder. Make sure that both sides of the  stepladder are locked into place. Ask someone to hold it for you, if possible. Clearly mark and make sure that you can see: Any grab bars or handrails. First and last steps. Where the edge of each step is. Use tools that help you move around (mobility aids) if they are needed. These include: Canes. Walkers. Scooters. Crutches. Turn on the lights when you go into a dark area. Replace any light bulbs as soon as they burn out. Set up your furniture so you have a clear path. Avoid moving your furniture around. If any of your floors are uneven, fix them. If there are any pets around you, be aware of where they are. Review your medicines with your doctor. Some medicines can make you feel dizzy. This can increase your chance of falling. Ask your doctor what other things that you can do to help prevent falls. This information is not intended to replace advice given to you by your health care provider. Make sure you discuss any questions you have with your health care provider. Document Released: 01/05/2009 Document Revised: 08/17/2015 Document Reviewed: 04/15/2014 Elsevier Interactive Patient Education  2017 [employer].

## 2021-12-13 LAB — CMP14+EGFR
ALT: 49 IU/L — ABNORMAL HIGH (ref 0–44)
AST: 30 IU/L (ref 0–40)
Albumin/Globulin Ratio: 1.8 (ref 1.2–2.2)
Albumin: 4.6 g/dL (ref 3.9–4.9)
Alkaline Phosphatase: 88 IU/L (ref 44–121)
BUN/Creatinine Ratio: 11 (ref 10–24)
BUN: 12 mg/dL (ref 8–27)
Bilirubin Total: 0.9 mg/dL (ref 0.0–1.2)
CO2: 26 mmol/L (ref 20–29)
Calcium: 9.5 mg/dL (ref 8.6–10.2)
Chloride: 101 mmol/L (ref 96–106)
Creatinine, Ser: 1.09 mg/dL (ref 0.76–1.27)
Globulin, Total: 2.6 g/dL (ref 1.5–4.5)
Glucose: 97 mg/dL (ref 70–99)
Potassium: 4.1 mmol/L (ref 3.5–5.2)
Sodium: 141 mmol/L (ref 134–144)
Total Protein: 7.2 g/dL (ref 6.0–8.5)
eGFR: 74 mL/min/{1.73_m2} (ref >59)

## 2021-12-13 LAB — LIPID PANEL
Chol/HDL Ratio: 3.5 ratio (ref 0.0–5.0)
Cholesterol, Total: 153 mg/dL (ref 100–199)
HDL: 44 mg/dL (ref >39)
LDL Chol Calc (NIH): 91 mg/dL (ref 0–99)
Triglycerides: 98 mg/dL (ref 0–149)
VLDL Cholesterol Cal: 18 mg/dL (ref 5–40)

## 2021-12-13 LAB — MICROALBUMIN / CREATININE URINE RATIO
Creatinine, Urine: 97 mg/dL
Microalb/Creat Ratio: 6 mg/g creat (ref 0–29)
Microalbumin, Urine: 5.5 ug/mL

## 2021-12-13 LAB — CBC
Hematocrit: 51.4 % — ABNORMAL HIGH (ref 37.5–51.0)
Hemoglobin: 17.4 g/dL (ref 13.0–17.7)
MCH: 28.1 pg (ref 26.6–33.0)
MCHC: 33.9 g/dL (ref 31.5–35.7)
MCV: 83 fL (ref 79–97)
Platelets: 110 10*3/uL — ABNORMAL LOW (ref 150–450)
RBC: 6.19 x10E6/uL — ABNORMAL HIGH (ref 4.14–5.80)
RDW: 13.9 % (ref 11.6–15.4)
WBC: 2.8 10*3/uL — ABNORMAL LOW (ref 3.4–10.8)

## 2021-12-13 LAB — HEMOGLOBIN A1C
Est. average glucose Bld gHb Est-mCnc: 123 mg/dL
Hgb A1c MFr Bld: 5.9 % — ABNORMAL HIGH (ref 4.8–5.6)

## 2022-02-25 NOTE — Progress Notes (Unsigned)
Andrew Fisher, [place] [id number]   CLINIC:  Medical Oncology/Hematology  PCP:  Harl Bowie, [facility] [place] [address] [phone]   REASON FOR VISIT:  Follow-up for sickle cell trait, iron deficiency without anemia, mild thrombocytopenia  PRIOR THERAPY: None  CURRENT THERAPY: Ferrous bisglycinate  INTERVAL HISTORY:  Andrew Fisher 66 y.o. male returns for routine follow-up of his iron deficiency, mild thrombocytopenia, and sickle cell trait.  He was last seen by Tarri Abernethy PA-C on 11/15/2021.  At today's visit, he reports feeling fair.  No recent hospitalizations, surgeries, or changes in baseline health status.  He has been taking his ferrous bisglycinate daily without any significant side effects.  He reports that overall his energy is improved, but that he is "just sleep deprived," since he is not sleeping well due to problems with his CPAP as well as his underlying PTSD.  He continues to deny any signs of blood loss such as hematemesis, hematochezia, melena, or epistaxis.  No easy bruising or petechial rash.  He reports intermittent shortness of breath from asthma.  Occasional chest pain when he lies down after eating, which he attributes to heartburn.  He has ongoing dizziness with position changes that he states feels similar to his previous bouts of vertigo.  He has occasional headaches.  No pica, restless legs, or syncope.  No B symptoms such as fever, chills, night sweats, unintentional weight loss. He has not had any frequent infections.  He has 50% energy and 70% appetite. He endorses that he is maintaining a stable weight.   REVIEW OF SYSTEMS: Review of Systems  Constitutional:  Negative for appetite change, chills, diaphoresis, fatigue, fever and unexpected weight change.  HENT:   Negative for lump/mass and nosebleeds.   Eyes:  Negative for eye problems.  Respiratory:  Positive for  shortness of breath (asthma). Negative for cough and hemoptysis.   Cardiovascular:  Positive for chest pain ("heartburn" per patient). Negative for leg swelling and palpitations.  Gastrointestinal:  Positive for constipation and nausea. Negative for abdominal pain, blood in stool, diarrhea and vomiting.  Genitourinary:  Positive for difficulty urinating (difficulty starting urine stream). Negative for hematuria.   Musculoskeletal:  Positive for arthralgias and back pain.  Skin: Negative.   Neurological:  Positive for dizziness (vertigo), headaches (occasional) and numbness. Negative for light-headedness.  Hematological:  Does not bruise/bleed easily.  Psychiatric/Behavioral:  Positive for sleep disturbance. The patient is nervous/anxious.       PAST MEDICAL/SURGICAL HISTORY:  Past Medical History:  Diagnosis Date   Arthritis    Asthma    GERD (gastroesophageal reflux disease)    Hearing loss    Hemorrhoids    Hyperlipidemia    Low testosterone in male    Peripheral neuropathy    Sleep apnea    wears cpap   Sore throat    Wears glasses    Wears hearing aid in both ears    Past Surgical History:  Procedure Laterality Date   ANTERIOR CERVICAL DECOMP/DISCECTOMY FUSION N/A 01/09/2017   Procedure: ANTERIOR CERVICAL DECOMPRESSION FUSION, CERVICAL FOUR-FIVE, CERVICAL FIVE-SIX INSTRUMENTATION AND ALLOGRAFT;  Surgeon: Phylliss Bob, MD;  Location: [place];  Service: Orthopedics;  Laterality: N/A;  ANTERIOR CERVICAL DECOMPRESSION FUSION, CERVICAL FOUR-FIVE, CERVICAL FIVE-SIX INSTRUMENTATION AND ALLOGRAFT   APPENDECTOMY     BIOPSY  11/12/2019   Procedure: BIOPSY;  Surgeon: Carol Ada, MD;  Location: [facility];  Service: Endoscopy;;   COLONOSCOPY  ESOPHAGOGASTRODUODENOSCOPY (EGD) WITH PROPOFOL N/A 11/12/2019   Procedure: ESOPHAGOGASTRODUODENOSCOPY (EGD) WITH PROPOFOL;  Surgeon: Carol Ada, MD;  Location: [facility];  Service: Endoscopy;  Laterality: N/A;   NECK SURGERY      SAVORY DILATION N/A 11/12/2019   Procedure: SAVORY DILATION;  Surgeon: Carol Ada, MD;  Location: [facility];  Service: Endoscopy;  Laterality: N/A;   SHOULDER SURGERY  2005   rotator cuff and tendons repaired     SOCIAL HISTORY:  Social History   Socioeconomic History   Marital status: Married    Spouse name: Not on file   Number of children: Not on file   Years of education: Not on file   Highest education level: Not on file  Occupational History   Not on file  Tobacco Use   Smoking status: Former    Packs/day: 0.25    Years: 6.00    Total pack years: 1.50    Types: Cigarettes   Smokeless tobacco: Never  Vaping Use   Vaping Use: Never used  Substance and Sexual Activity   Alcohol use: Not Currently   Drug use: No   Sexual activity: Not on file  Other Topics Concern   Not on file  Social History Narrative   Not on file   Social Determinants of Health   Financial Resource Strain: Low Risk  (12/12/2021)   Overall Financial Resource Strain (CARDIA)    Difficulty of Paying Living Expenses: Not hard at all  Food Insecurity: No Food Insecurity (12/12/2021)   Hunger Vital Sign    Worried About Running Out of Food in the Last Year: Never true    Ran Out of Food in the Last Year: Never true  Transportation Needs: No Transportation Needs (12/12/2021)   PRAPARE - Hydrologist (Medical): No    Lack of Transportation (Non-Medical): No  Physical Activity: Sufficiently Active (12/12/2021)   Exercise Vital Sign    Days of Exercise per Week: 5 days    Minutes of Exercise per Session: 50 min  Stress: Stress Concern Present (12/12/2021)   [place]    Feeling of Stress : To some extent  Social Connections: Not on file  Intimate Partner Violence: Not on file    FAMILY HISTORY:  Family History  Problem Relation Age of Onset   Diabetes Mother    Hypertension Mother    Healthy  Father    Cancer Other     CURRENT MEDICATIONS:  Outpatient Encounter Medications as of 02/26/2022  Medication Sig   albuterol (VENTOLIN HFA) 108 (90 Base) MCG/ACT inhaler USE 1 TO 2 INHALATIONS EVERY 6 HOURS AS NEEDED FOR WHEEZING OR SHORTNESS OF BREATH (Patient taking differently: Inhale 2 puffs into the lungs every 6 (six) hours as needed for wheezing or shortness of breath.)   amLODipine (NORVASC) 10 MG tablet Take 10 mg by mouth daily.   aspirin EC 81 MG tablet Take 81 mg by mouth daily.   atorvastatin (LIPITOR) 10 MG tablet TAKE 1 TABLET DAILY (Patient taking differently: Take 10 mg by mouth daily.)   benzonatate (TESSALON) 100 MG capsule Take 1 capsule (100 mg total) by mouth 2 (two) times daily as needed for cough.   Blood Glucose Monitoring Suppl KIT Use as directed to check blood sugars 1 time per day dx:e11.22   Buprenorphine HCl 450 MCG FILM Take 300 mcg by mouth every 12 (twelve) hours.    Buprenorphine HCl 450 MCG FILM PLACE  ONE FILM TO CHEEK AND GUM UNTIL DISSOLVED EVERY 12 HOURS   Carboxymethylcellulose Sod PF (THERATEARS PF) 0.25 % SOLN INSTILL 1 DROP IN BOTH EYES FOUR TIMES A DAY   celecoxib (CELEBREX) 200 MG capsule Take 1 capsule by mouth 2 (two) times daily.   EPINEPHrine 0.3 mg/0.3 mL IJ SOAJ injection Inject 0.3 mLs (0.3 mg total) into the muscle as needed for anaphylaxis. (Patient taking differently: Inject 0.3 mg into the muscle daily as needed for anaphylaxis.)   fluticasone (FLONASE) 50 MCG/ACT nasal spray Place 2 sprays into both nostrils daily.   gabapentin (NEURONTIN) 300 MG capsule Take 300 mg by mouth 4 (four) times daily.   glucose blood test strip Use as directed to check blood sugars 1 time per day dx:e11.22   hydrocortisone 2.5 % cream SMARTSIG:1 Topical Daily   Lancets 33G MISC Use as directed to check blood sugars 1 time per day dx:e11.22   lansoprazole (PREVACID) 30 MG capsule Take 30 mg by mouth daily.    levalbuterol (XOPENEX HFA) 45 MCG/ACT inhaler  Inhale into the lungs.   LINZESS 145 MCG CAPS capsule Take 145 mcg by mouth daily as needed (constipation).    loratadine (CLARITIN) 10 MG tablet Take 10 mg by mouth daily.   magnesium oxide (MAG-OX) 400 MG tablet Take 400 mg by mouth daily.   meloxicam (MOBIC) 15 MG tablet Take by mouth.   MI-ACID GAS RELIEF 80 MG chewable tablet    montelukast (SINGULAIR) 10 MG tablet TAKE 1 TABLET DAILY (Patient taking differently: Take 10 mg by mouth daily.)   neomycin-polymyxin-hydrocortisone (CORTISPORIN) 3.5-10000-1 OTIC suspension Place 4 drops into both ears 4 (four) times daily. X 7 days   sertraline (ZOLOFT) 100 MG tablet Take 100 mg by mouth daily.   tadalafil (CIALIS) 5 MG tablet Take 5 mg by mouth daily.    tamsulosin (FLOMAX) 0.4 MG CAPS capsule Take 0.4 mg by mouth at bedtime.    temazepam (RESTORIL) 15 MG capsule Take by mouth.   Testosterone 30 MG/ACT SOLN Apply 30 mg topically daily. Apply under each arm   tiZANidine (ZANAFLEX) 4 MG tablet Take 4 mg by mouth 3 (three) times daily.   urea (CARMOL) 20 % cream Apply 1 application topically daily as needed (Feet pain).    No facility-administered encounter medications on file as of 02/26/2022.    ALLERGIES:  No Known Allergies   PHYSICAL EXAM: ECOG PERFORMANCE STATUS: 0 - Asymptomatic  There were no vitals filed for this visit. There were no vitals filed for this visit. Physical Exam Constitutional:      Appearance: Normal appearance. He is obese.  HENT:     Head: Normocephalic and atraumatic.     Mouth/Throat:     Mouth: Mucous membranes are moist.  Eyes:     Extraocular Movements: Extraocular movements intact.     Pupils: Pupils are equal, round, and reactive to light.  Cardiovascular:     Rate and Rhythm: Normal rate and regular rhythm.     Pulses: Normal pulses.     Heart sounds: Normal heart sounds.  Pulmonary:     Effort: Pulmonary effort is normal.     Breath sounds: Normal breath sounds.  Abdominal:     General:  Bowel sounds are normal.     Palpations: Abdomen is soft.     Tenderness: There is no abdominal tenderness.  Musculoskeletal:        General: No swelling.     Right lower leg: No edema.  Left lower leg: No edema.  Lymphadenopathy:     Cervical: No cervical adenopathy.  Skin:    General: Skin is warm and dry.  Neurological:     General: No focal deficit present.     Mental Status: He is alert and oriented to person, place, and time.  Psychiatric:        Mood and Affect: Mood normal.        Behavior: Behavior normal.      LABORATORY DATA:  I have reviewed the labs as listed.  CBC    Component Value Date/Time   WBC 2.8 (L) 12/12/2021 1545   WBC 3.0 (L) 11/15/2021 1000   RBC 6.19 (H) 12/12/2021 1545   RBC 6.05 (H) 11/15/2021 1000   HGB 17.4 12/12/2021 1545   HCT 51.4 (H) 12/12/2021 1545   PLT 110 (L) 12/12/2021 1545   MCV 83 12/12/2021 1545   MCH 28.1 12/12/2021 1545   MCH 28.6 11/15/2021 1000   MCHC 33.9 12/12/2021 1545   MCHC 34.5 11/15/2021 1000   RDW 13.9 12/12/2021 1545   LYMPHSABS 1.2 11/15/2021 1000   LYMPHSABS 1.0 11/23/2020 1512   MONOABS 0.3 11/15/2021 1000   EOSABS 0.2 11/15/2021 1000   EOSABS 0.3 11/23/2020 1512   BASOSABS 0.0 11/15/2021 1000   BASOSABS 0.0 11/23/2020 1512      Latest Ref Rng & Units 12/12/2021    3:45 PM 11/23/2020    3:12 PM 11/17/2019   11:15 AM  CMP  Glucose 70 - 99 mg/dL 97  89  100   BUN 8 - 27 mg/dL _0 Creatinine 0.76 - 1.27 mg/dL 1.09  1.18  1.11   Sodium 134 - 144 mmol/L 141  136  137   Potassium 3.5 - 5.2 mmol/L 4.1  4.3  4.4   Chloride 96 - 106 mmol/L 101  101  100   CO2 20 - 29 mmol/L _1 Calcium 8.6 - 10.2 mg/dL 9.5  9.0  9.3   Total Protein 6.0 - 8.5 g/dL 7.2  6.8  7.3   Total Bilirubin 0.0 - 1.2 mg/dL 0.9  0.4  0.5   Alkaline Phos 44 - 121 IU/L 88  81  88   AST 0 - 40 IU/L _2 ALT 0 - 44 IU/L 49  18  19     DIAGNOSTIC IMAGING:  I have independently reviewed the relevant imaging and  discussed with the patient.  ASSESSMENT & PLAN: 1.  Iron deficiency without anemia  - Work-up of abnormal RBC indices (02/02/2021) revealed iron deficiency with ferritin 6, iron saturation 3%, serum iron 14, and TIBC 435. - EGD (11/12/2019): Normal stomach and duodenum.  No significant abnormalities in the esophagus. - Colonoscopy (08/18/2018): Colon polyp, otherwise unremarkable. - Patient was started on oral iron supplementation (ferrous bisglycinate) in November 2022, which he has been tolerating well. - No hematemesis, hematochezia, or melena - Energy is improved. - Labs today (02/26/2022): Hgb 16.5, ferritin 121, iron saturation 24%. - She has noted to have mild intermittent erythrocytosis, likely related to OSA.  Also has some chronic elevations in RBCs with microcytosis that is attributable to his sickle cell trait. - PLAN: Iron levels have improved on oral supplementation, recommend that he continue oral iron supplementation.  No need for IV iron at this time. - Repeat CBC/iron panel and RTC in 4 months.  2.  Thrombocytopenia, mild and intermittent -  Mild intermittent thrombocytopenia since February 2021 with platelets ranging from 101 to normal. - CT abdomen/pelvis in 2017 showed stable cysts in liver, normal-sized spleen - SPEP normal - He denies any bleeding, bruising, or petechial rash. - CBC today (02/26/2022): Platelets 111 - Additional labs today (02/26/2022): Normal B12 581, normal folate 8.6.  MMA and copper pending. - PLAN: Nutritional panel today is pending.  Will watch for results and if negative for nutritional deficiency will consider additional testing. - Tentative follow-up is scheduled for 4 months, but we may need to move this up depending on this. 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  Low white count: - He had low white count over several years (since at least  2017).  He does not have any recurrent infections. - Prior CBC (02/02/2021): WBC 2.5 with ANC 1.5 and lymphocytes 0.6 - Most recent CBC (02/26/2022): WBC 3.3/ANC 1.5, at baseline - SPEP negative.  Normal folate, B12 and methylmalonic acid.  LDH normal. 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  However, since patient has also developed thrombocytopenia, will consider possible bone marrow biopsy in the future.  4.  Sickle cell trait - Seen at the request of Dr. Baird Cancer for abnormal CBC (elevated RBC count with low MCV, MCH, MCHC) - No fevers, night sweats, or weight loss - No history of blood transfusion  - Hemoglobin electrophoresis (02/02/2021) consistent with sickle cell trait (heterozygous) - Patient is asymptomatic, has never had any symptoms suggestive of acute pain crisis or venoocclusive events.   - Patient's son also has sickle cell trait, but has never shown any symptoms or required treatment. - Discussed with patient that sickle cell trait is a benign carrier condition for the sickle hemoglobin variant, but that in most patients it does not cause any symptoms or clinical issues.  We did discuss that in very rare cases patients can have symptoms of acute pain and venoocclusive disease, but that this is highly unlikely since the patient has been asymptomatic thus far. - Discussed that the patient is at risk for some blood sickling in cases of significant hypoxia (such as high-altitude and pressurized aircraft, very strenuous exercise with dehydration, etc.). - Discussed with patient that his children and grandchildren would benefit from being screened for sickle cell trait to assist with future reproductive decisions. - PLAN: No indication for ongoing hematology follow-up of asymptomatic sickle cell trait. - Extensive discussion of diagnosis as above. - Patient will continue to follow with primary care provider, but is aware that he can be referred back to [place] in the future  if needed.  5.  Social/family history: - He worked in the [employer] for 23 years and retired as a first Chief Technology Officer.  No clear exposure to chemicals.  Quit smoking in 1979. - He has sleep apnea and has been using CPAP since 2006. - Son had HBs trait.  Mother is a Jehovah witness and also had colon cancer.  Maternal aunt died of ovarian cancer in her 44s.   PLAN SUMMARY: >> Tentatively scheduled for labs in 4 months with office visit 1 week after labs >> Results from today are PENDING, which may change follow-up plan   All questions were answered. The patient knows to call the clinic with any problems, questions or concerns.  Medical decision making: Moderate  Time spent on visit: I spent 20 minutes counseling the patient face to  face. The total time spent in the appointment was 30 minutes and more than 50% was on counseling.   Harriett Rush, PA-C  02/26/2022 6:48 PM

## 2022-02-26 ENCOUNTER — Inpatient Hospital Stay: Payer: Medicare Other | Attending: Physician Assistant | Admitting: Physician Assistant

## 2022-02-26 ENCOUNTER — Inpatient Hospital Stay: Payer: Medicare Other

## 2022-02-26 VITALS — BP 131/86 | HR 67 | Temp 97.8°F | Resp 17 | Ht 68.0 in | Wt 205.7 lb

## 2022-02-26 DIAGNOSIS — Z8041 Family history of malignant neoplasm of ovary: Secondary | ICD-10-CM | POA: Insufficient documentation

## 2022-02-26 DIAGNOSIS — D708 Other neutropenia: Secondary | ICD-10-CM

## 2022-02-26 DIAGNOSIS — D696 Thrombocytopenia, unspecified: Secondary | ICD-10-CM | POA: Diagnosis not present

## 2022-02-26 DIAGNOSIS — D573 Sickle-cell trait: Secondary | ICD-10-CM | POA: Diagnosis present

## 2022-02-26 DIAGNOSIS — Z87891 Personal history of nicotine dependence: Secondary | ICD-10-CM | POA: Insufficient documentation

## 2022-02-26 DIAGNOSIS — D509 Iron deficiency anemia, unspecified: Secondary | ICD-10-CM

## 2022-02-26 DIAGNOSIS — E611 Iron deficiency: Secondary | ICD-10-CM | POA: Insufficient documentation

## 2022-02-26 DIAGNOSIS — Z67A1 Duffy null: Secondary | ICD-10-CM

## 2022-02-26 DIAGNOSIS — Z800 Family history of malignant neoplasm of digestive organs: Secondary | ICD-10-CM | POA: Diagnosis not present

## 2022-02-26 DIAGNOSIS — R718 Other abnormality of red blood cells: Secondary | ICD-10-CM

## 2022-02-26 LAB — CBC WITH DIFFERENTIAL/PLATELET
Abs Immature Granulocytes: 0.01 10*3/uL (ref 0.00–0.07)
Basophils Absolute: 0 10*3/uL (ref 0.0–0.1)
Basophils Relative: 0 %
Eosinophils Absolute: 0.2 10*3/uL (ref 0.0–0.5)
Eosinophils Relative: 5 %
HCT: 48.9 % (ref 39.0–52.0)
Hemoglobin: 16.5 g/dL (ref 13.0–17.0)
Immature Granulocytes: 0 %
Lymphocytes Relative: 41 %
Lymphs Abs: 1.4 10*3/uL (ref 0.7–4.0)
MCH: 28.9 pg (ref 26.0–34.0)
MCHC: 33.7 g/dL (ref 30.0–36.0)
MCV: 85.8 fL (ref 80.0–100.0)
Monocytes Absolute: 0.3 10*3/uL (ref 0.1–1.0)
Monocytes Relative: 8 %
Neutro Abs: 1.5 10*3/uL — ABNORMAL LOW (ref 1.7–7.7)
Neutrophils Relative %: 46 %
Platelets: 111 10*3/uL — ABNORMAL LOW (ref 150–400)
RBC: 5.7 MIL/uL (ref 4.22–5.81)
RDW: 13.2 % (ref 11.5–15.5)
WBC: 3.3 10*3/uL — ABNORMAL LOW (ref 4.0–10.5)
nRBC: 0 % (ref 0.0–0.2)

## 2022-02-26 LAB — IRON AND TIBC
Iron: 81 ug/dL (ref 45–182)
Saturation Ratios: 24 % (ref 17.9–39.5)
TIBC: 336 ug/dL (ref 250–450)
UIBC: 255 ug/dL

## 2022-02-26 LAB — FOLATE: Folate: 8.6 ng/mL (ref >5.9)

## 2022-02-26 LAB — VITAMIN B12: Vitamin B-12: 581 pg/mL (ref 180–914)

## 2022-02-26 LAB — FERRITIN: Ferritin: 121 ng/mL (ref 24–336)

## 2022-02-26 NOTE — Patient Instructions (Signed)
[facility] at [facility] Discharge Instructions  You were seen today by Rojelio Brenner PA-C for your iron deficiency.    ** Your vitamin and mineral labs are not back yet, but I will send you a MY-CHART MESSAGE later this week to discuss those results and any other steps that need to be taken.  Your blood counts (red blood cell/hemoglobin) look normal.  You have chronically low white blood cells, which appears to be normal for you.  Your platelets are still mildly low, but not at a level that they are dangerously low.  I will continue to monitor your blood counts and may order additional tests based on the lab results from today.  FOLLOW UP: Labs in 4 months with office visit 1 week after labs.  ** Thank you for trusting me with your healthcare!  I strive to provide all of my patients with quality care at each visit.  If you receive a survey for this visit, I would be so grateful to you for taking the time to provide feedback.  Thank you in advance!  ~ Jozi Malachi                   Dr. Doreatha Massed   &   Rojelio Brenner, PA-C   - - - - - - - - - - - - - - - - - -     Thank you for choosing [facility] at [facility] to provide your oncology and hematology care.  To afford each patient quality time with our provider, please arrive at least 15 minutes before your scheduled appointment time.   If you have a lab appointment with the Cancer Center please come in thru the Main Entrance and check in at the main information desk.  You need to re-schedule your appointment should you arrive 10 or more minutes late.  We strive to give you quality time with our providers, and arriving late affects you and other patients whose appointments are after yours.  Also, if you no show three or more times for appointments you may be dismissed from the clinic at the providers discretion.     Again, thank you for choosing [facility].  Our hope is  that these requests will decrease the amount of time that you wait before being seen by our physicians.       _____________________________________________________________  Should you have questions after your visit to [facility], please contact our office at [phone] and follow the prompts.  Our office hours are 8:00 a.m. and 4:30 p.m. Monday - Friday.  Please note that voicemails left after 4:00 p.m. may not be returned until the following business day.  We are closed weekends and major holidays.  You do have access to a nurse 24-7, just call the main number to the clinic [phone] and do not press any options, hold on the line and a nurse will answer the phone.    For prescription refill requests, have your pharmacy contact our office and allow 72 hours.    Due to Covid, you will need to wear a mask upon entering the hospital. If you do not have a mask, a mask will be given to you at the Main Entrance upon arrival. For doctor visits, patients may have 1 support person age 20 or older with them. For treatment visits, patients can not have anyone with them due to social distancing guidelines and  our immunocompromised population.

## 2022-02-27 LAB — ERYTHROPOIETIN: Erythropoietin: 14.7 m[IU]/mL (ref 2.6–18.5)

## 2022-03-05 LAB — METHYLMALONIC ACID, SERUM: Methylmalonic Acid, Quantitative: 86 nmol/L (ref 0–378)

## 2022-03-06 LAB — COPPER, SERUM: Copper: 89 ug/dL (ref 69–132)

## 2022-03-08 ENCOUNTER — Encounter: Payer: Self-pay | Admitting: Physician Assistant

## 2022-03-08 ENCOUNTER — Other Ambulatory Visit: Payer: Self-pay | Admitting: Physician Assistant

## 2022-03-08 DIAGNOSIS — D696 Thrombocytopenia, unspecified: Secondary | ICD-10-CM

## 2022-03-08 NOTE — Progress Notes (Signed)
Workup of thrombocytopenia unremarkable so far: - Normal nutritional panel (02/26/2022) with normal copper, iron, folate, methylmalonic acid, B12 - Prior testing showed normal SPEP (November 2022), negative ANA and rheumatoid factor (November 2021), and negative H. pylori IgG antibodies (via testing done by the [place] in December 2023)  Prior to patient's upcoming visit in April 2024, we will schedule him for: - Labs/blood work: Hepatitis B, hepatitis C, immature platelet fraction, CBC/D, ferritin, iron/TIBC, CMP - Abdominal ultrasound to evaluate for any occult liver disease or splenomegaly  Carnella Guadalajara, PA-C 03/08/22 5:15 PM

## 2022-04-23 ENCOUNTER — Other Ambulatory Visit (HOSPITAL_BASED_OUTPATIENT_CLINIC_OR_DEPARTMENT_OTHER): Payer: Self-pay

## 2022-04-23 DIAGNOSIS — G4733 Obstructive sleep apnea (adult) (pediatric): Secondary | ICD-10-CM

## 2022-06-03 ENCOUNTER — Ambulatory Visit: Payer: No Typology Code available for payment source | Attending: Family | Admitting: Pulmonary Disease

## 2022-06-03 DIAGNOSIS — G4736 Sleep related hypoventilation in conditions classified elsewhere: Secondary | ICD-10-CM | POA: Diagnosis not present

## 2022-06-03 DIAGNOSIS — G4733 Obstructive sleep apnea (adult) (pediatric): Secondary | ICD-10-CM | POA: Insufficient documentation

## 2022-06-07 ENCOUNTER — Other Ambulatory Visit (HOSPITAL_COMMUNITY): Payer: Self-pay | Admitting: Physician Assistant

## 2022-06-07 DIAGNOSIS — M25562 Pain in left knee: Secondary | ICD-10-CM

## 2022-06-08 NOTE — Procedures (Signed)
Patient Name: Andrew Fisher, Andrew Fisher Date: 06/03/2022 Gender: Male D.O.B: June 18, 1955 Age (years): 66 Referring Provider: Alice Reichert Height (inches): 68 Interpreting Physician: Kara Mead MD, ABSM Weight (lbs): 507 RPSGT: Rosebud Poles BMI: 77 MRN: [id number] Neck Size: 18.00 <br> <br> CLINICAL INFORMATION The patient is referred from the [facility] for a BiPAP titration to treat sleep apnea.  Baseline study not available. Per [facility] 'compliance data for the past 30 days showed that he use device 100% of the time for an average of 7 hours of nights use,  his average pressure is 16.9 CWP, 95 percentile leakage is 8 L/min. His residual apneic hypopneic index is 10 events per hour. Recommend continue  CPAP therapy for now and trial low-profile fullface mask for mask comfort. Recommended BiPAP titration study. '  SLEEP STUDY TECHNIQUE As per the AASM Manual for the Scoring of Sleep and Associated Events v2.3 (April 2016) with a hypopnea requiring 4% desaturations.  The channels recorded and monitored were frontal, central and occipital EEG, electrooculogram (EOG), submentalis EMG (chin), nasal and oral airflow, thoracic and abdominal wall motion, anterior tibialis EMG, snore microphone, electrocardiogram, and pulse oximetry. Bilevel positive airway pressure (BPAP) was initiated at the beginning of the study and titrated to treat sleep-disordered breathing.  MEDICATIONS Medications self-administered by patient taken the night of the study : N/A  RESPIRATORY PARAMETERS Optimal IPAP Pressure (cm):  AHI at Optimal Pressure (/hr) N/A Optimal EPAP Pressure (cm):    Overall Minimal O2 (%): 76.00 Minimal O2 at Optimal Pressure (%): 76.00 SLEEP ARCHITECTURE Start Time: 10:59:27 PM Stop Time: 6:07:31 AM Total Time (min): 428.1 Total Sleep Time (min): 337 Sleep Latency (min): 86.7 Sleep Efficiency (%): 78.7 REM Latency (min): N/A WASO (min): 4.3 Stage N1 (%): 0.59 Stage N2 (%): 52.23 Stage N3  (%): 47.18 Stage R (%): 0 Supine (%): 58.46 Arousal Index (/hr): 2.8     CARDIAC DATA The 2 lead EKG demonstrated sinus rhythm. The mean heart rate was 56.27 beats per minute. Other EKG findings include: Ventricular Tachycardia, PVCs.   LEG MOVEMENT DATA The total Periodic Limb Movements of Sleep (PLMS) were 0. The PLMS index was 0.00. A PLMS index of <15 is considered normal in adults.  IMPRESSIONS - An optimal PAP pressure could not be selected for this patient based on the available study data. BiPAP was initiated at 8/4 & titrated to 22/18 - Central sleep apnea was not noted during this titration (CAI = 2/h). - Severe oxygen desaturations were observed during this titration (min O2 = 76.00%). - The patient snored with loud snoring volume. - 2-lead EKG demonstrated: Ventricular Tachycardia, PVCs - Clinically significant periodic limb movements were not noted during this study. Arousals associated with PLMs were rare.   DIAGNOSIS - Obstructive Sleep Apnea (G47.33) - Nocturnal Hypoxemia (G47.36)   RECOMMENDATIONS - Recommend a trial of Auto-BiPAP- EPAP min 10, PS +4, IPAP max 25 cm & adjust bsaed on download data - Avoid alcohol, sedatives and other CNS depressants that may worsen sleep apnea and disrupt normal sleep architecture. - Sleep hygiene should be reviewed to assess factors that may improve sleep quality. - Weight management and regular exercise should be initiated or continued. - Return to Sleep Center for re-evaluation after 4 weeks of therapy   Kara Mead MD Board Certified in [place]

## 2022-06-10 DIAGNOSIS — G4733 Obstructive sleep apnea (adult) (pediatric): Secondary | ICD-10-CM

## 2022-06-13 ENCOUNTER — Ambulatory Visit (INDEPENDENT_AMBULATORY_CARE_PROVIDER_SITE_OTHER): Payer: Medicare Other | Admitting: Internal Medicine

## 2022-06-13 ENCOUNTER — Encounter: Payer: Self-pay | Admitting: Internal Medicine

## 2022-06-13 ENCOUNTER — Ambulatory Visit: Payer: Medicare Other | Admitting: Internal Medicine

## 2022-06-13 VITALS — BP 136/90 | HR 77 | Temp 97.5°F | Ht 68.0 in | Wt 204.4 lb

## 2022-06-13 DIAGNOSIS — I10 Essential (primary) hypertension: Secondary | ICD-10-CM

## 2022-06-13 DIAGNOSIS — E6609 Other obesity due to excess calories: Secondary | ICD-10-CM

## 2022-06-13 DIAGNOSIS — R351 Nocturia: Secondary | ICD-10-CM | POA: Diagnosis not present

## 2022-06-13 DIAGNOSIS — E7800 Pure hypercholesterolemia, unspecified: Secondary | ICD-10-CM | POA: Diagnosis not present

## 2022-06-13 DIAGNOSIS — D573 Sickle-cell trait: Secondary | ICD-10-CM | POA: Insufficient documentation

## 2022-06-13 DIAGNOSIS — D708 Other neutropenia: Secondary | ICD-10-CM | POA: Insufficient documentation

## 2022-06-13 DIAGNOSIS — Z6831 Body mass index (BMI) 31.0-31.9, adult: Secondary | ICD-10-CM

## 2022-06-13 DIAGNOSIS — R7309 Other abnormal glucose: Secondary | ICD-10-CM

## 2022-06-13 NOTE — Progress Notes (Signed)
I,[name],acting as a scribe for [name], MD.,have documented all relevant documentation on the behalf of [name], MD,as directed by  [name], MD while in the presence of [name], MD.    Subjective:     Patient ID: Andrew Fisher , male    DOB: [birth year] , 67 y.o.   MRN: [id number]   Chief Complaint  Patient presents with   Hyperlipidemia   Hypertension    HPI  Pt presents today for bp & chol check.  He is also followed by the [facility] for his chronic conditions. Most of his specialists are now at the [place]. He denies headaches, chest pain and shortness of breath.  He reports his wife is currently for total knee replacement. He states she is doing well. He admits to having a lot on his plate, along with driving to and from [place] to [place].     Hyperlipidemia This is a chronic problem. The current episode started more than 1 year ago. The problem is controlled. He has no history of liver disease. Pertinent negatives include no shortness of breath. Current antihyperlipidemic treatment includes statins.  Hypertension This is a chronic problem. The current episode started more than 1 year ago. The problem has been gradually improving since onset. The problem is controlled. Pertinent negatives include no anxiety, neck pain, orthopnea or shortness of breath. Past treatments include calcium channel blockers. The current treatment provides moderate improvement.     Past Medical History:  Diagnosis Date   Arthritis    Asthma    GERD (gastroesophageal reflux disease)    Hearing loss    Hemorrhoids    Hyperlipidemia    Low testosterone in male    Peripheral neuropathy    Sleep apnea    wears cpap   Sore throat    Wears glasses    Wears hearing aid in both ears      Family History  Problem Relation Age of Onset   Diabetes Mother    Hypertension Mother    Healthy Father    Cancer Other      Current Outpatient Medications:     albuterol (VENTOLIN HFA) 108 (90 Base) MCG/ACT inhaler, USE 1 TO 2 INHALATIONS EVERY 6 HOURS AS NEEDED FOR WHEEZING OR SHORTNESS OF BREATH (Patient taking differently: Inhale 2 puffs into the lungs every 6 (six) hours as needed for wheezing or shortness of breath.), Disp: 17 g, Rfl: 4   amLODipine (NORVASC) 10 MG tablet, Take 10 mg by mouth daily., Disp: , Rfl:    atorvastatin (LIPITOR) 10 MG tablet, TAKE 1 TABLET DAILY (Patient taking differently: Take 10 mg by mouth daily.), Disp: 90 tablet, Rfl: 3   Blood Glucose Monitoring Suppl KIT, Use as directed to check blood sugars 1 time per day dx:e11.22, Disp: 1 kit, Rfl: 1   Buprenorphine HCl 450 MCG FILM, Take 300 mcg by mouth every 12 (twelve) hours. , Disp: , Rfl:    Buprenorphine HCl 450 MCG FILM, PLACE ONE FILM TO CHEEK AND GUM UNTIL DISSOLVED EVERY 12 HOURS, Disp: , Rfl:    EPINEPHrine 0.3 mg/0.3 mL IJ SOAJ injection, Inject 0.3 mLs (0.3 mg total) into the muscle as needed for anaphylaxis. (Patient taking differently: Inject 0.3 mg into the muscle daily as needed for anaphylaxis.), Disp: 1 each, Rfl: 2   fluticasone (FLONASE) 50 MCG/ACT nasal spray, Place 2 sprays into both nostrils daily., Disp: 16 g, Rfl: 0   gabapentin (NEURONTIN) 300 MG  capsule, Take 300 mg by mouth 4 (four) times daily., Disp: , Rfl:    glucose blood test strip, Use as directed to check blood sugars 1 time per day dx:e11.22, Disp: 150 each, Rfl: 2   hydrocortisone 2.5 % cream, SMARTSIG:1 Topical Daily, Disp: , Rfl:    Lancets 33G MISC, Use as directed to check blood sugars 1 time per day dx:e11.22, Disp: 150 each, Rfl: 2   lansoprazole (PREVACID) 30 MG capsule, Take 30 mg by mouth daily. , Disp: , Rfl:    levalbuterol (XOPENEX HFA) 45 MCG/ACT inhaler, Inhale into the lungs., Disp: , Rfl:    LINZESS 145 MCG CAPS capsule, Take 145 mcg by mouth daily as needed (constipation). , Disp: , Rfl:    loratadine (CLARITIN) 10 MG tablet, Take 10 mg by mouth daily., Disp: , Rfl:     magnesium oxide (MAG-OX) 400 MG tablet, Take 400 mg by mouth daily., Disp: , Rfl:    MI-ACID GAS RELIEF 80 MG chewable tablet, , Disp: , Rfl:    montelukast (SINGULAIR) 10 MG tablet, TAKE 1 TABLET DAILY (Patient taking differently: Take 10 mg by mouth daily.), Disp: 90 tablet, Rfl: 1   neomycin-polymyxin-hydrocortisone (CORTISPORIN) 3.5-10000-1 OTIC suspension, Place 4 drops into both ears 4 (four) times daily. X 7 days, Disp: 10 mL, Rfl: 0   sertraline (ZOLOFT) 100 MG tablet, Take 100 mg by mouth daily., Disp: , Rfl:    tadalafil (CIALIS) 5 MG tablet, Take 5 mg by mouth daily. , Disp: , Rfl:    temazepam (RESTORIL) 15 MG capsule, Take by mouth., Disp: , Rfl:    Testosterone 30 MG/ACT SOLN, Apply 30 mg topically daily. Apply under each arm, Disp: , Rfl:    tiZANidine (ZANAFLEX) 4 MG tablet, Take 4 mg by mouth 3 (three) times daily., Disp: , Rfl:    urea (CARMOL) 20 % cream, Apply 1 application topically daily as needed (Feet pain). , Disp: , Rfl:    aspirin EC 81 MG tablet, Take 81 mg by mouth daily. (Patient not taking: Reported on 06/13/2022), Disp: , Rfl:    benzonatate (TESSALON) 100 MG capsule, Take 1 capsule (100 mg total) by mouth 2 (two) times daily as needed for cough. (Patient not taking: Reported on 06/13/2022), Disp: 20 capsule, Rfl: 0   meloxicam (MOBIC) 15 MG tablet, Take by mouth. (Patient not taking: Reported on 06/13/2022), Disp: , Rfl:    tamsulosin (FLOMAX) 0.4 MG CAPS capsule, Take 0.4 mg by mouth at bedtime.  (Patient not taking: Reported on 06/13/2022), Disp: , Rfl:    No Known Allergies   Review of Systems  Constitutional: Negative.   HENT: Negative.    Respiratory: Negative.  Negative for shortness of breath.   Cardiovascular: Negative.  Negative for orthopnea.  Gastrointestinal: Negative.   Musculoskeletal: Negative.  Negative for neck pain.  Skin: Negative.   Hematological: Negative.      Today's Vitals   06/13/22 1130 06/13/22 1143  BP: (!) 140/100 (!) 136/90   Pulse: 77   Temp: (!) 97.5 F (36.4 C)   SpO2: 98%   Weight: 204 lb 6.4 oz (92.7 kg)   Height: 5\' 8"  (1.727 m)    Body mass index is 31.08 kg/m.  Wt Readings from Last 3 Encounters:  06/13/22 204 lb 6.4 oz (92.7 kg)  02/26/22 205 lb 11.2 oz (93.3 kg)  12/12/21 206 lb (93.4 kg)    Objective:  Physical Exam Vitals and nursing note reviewed.  Constitutional:  Appearance: Normal appearance.  HENT:     Head: Normocephalic and atraumatic.     Nose:     Comments: Masked     Mouth/Throat:     Comments: Masked  Eyes:     Extraocular Movements: Extraocular movements intact.  Cardiovascular:     Rate and Rhythm: Normal rate and regular rhythm.     Heart sounds: Normal heart sounds.  Pulmonary:     Effort: Pulmonary effort is normal.     Breath sounds: Normal breath sounds.  Musculoskeletal:     Cervical back: Normal range of motion.  Skin:    General: Skin is warm.  Neurological:     General: No focal deficit present.     Mental Status: He is alert.  Psychiatric:        Mood and Affect: Mood normal.         Assessment And Plan:     1. Essential hypertension, benign Comments: Chronic, uncontrolled. He states this is managed by the [place]. He will c/w amlodipine 10mg  daily for now. BP likely exacerbated by stress. No med changes today. - CMP14+EGFR - Lipid panel  2. Pure hypercholesterolemia Comments: Chronic, he will c/w atorvastatin 10mg  daily for now. He is encouraged to follow heart healthy lifestyle. - CMP14+EGFR - Lipid panel  3. Nocturia Comments: Chronic, I will check PSA today. - PSA  4. Sickle cell trait ([place]) Comments: Chronic, he is currently asymptomatic.  5. Benign ethnic neutropenia (HCC) Comments: Chronic, WBC 3.3 in Dec 2023. No need to recheck today.  This has been stable.  6. Other abnormal glucose Comments: Previous labs reviewed, his a1c has been elevated in the past. I will recheck an a1c today. He is encouraged to decrease intake of  sugary drinks/foods. - Hemoglobin A1c  7. Class 1 obesity due to excess calories with serious comorbidity and body mass index (BMI) of 31.0 to 31.9 in adult He is encouraged to strive for BMI less than 30 to decrease cardiac risk. Advised to aim for at least 150 minutes of exercise per week.    Patient was given opportunity to ask questions. Patient verbalized understanding of the plan and was able to repeat key elements of the plan. All questions were answered to their satisfaction.   I, [name], MD, have reviewed all documentation for this visit. The documentation on 06/15/22 for the exam, diagnosis, procedures, and orders are all accurate and complete.   IF YOU HAVE BEEN REFERRED TO A SPECIALIST, IT MAY TAKE 1-2 WEEKS TO SCHEDULE/PROCESS THE REFERRAL. IF YOU HAVE NOT HEARD FROM US/SPECIALIST IN TWO WEEKS, PLEASE GIVE [place] A CALL AT [phone].   THE PATIENT IS ENCOURAGED TO PRACTICE SOCIAL DISTANCING DUE TO THE COVID-19 PANDEMIC.

## 2022-06-13 NOTE — Patient Instructions (Signed)

## 2022-06-14 LAB — CMP14+EGFR
ALT: 27 IU/L (ref 0–44)
AST: 17 IU/L (ref 0–40)
Albumin/Globulin Ratio: 1.9 (ref 1.2–2.2)
Albumin: 4.5 g/dL (ref 3.9–4.9)
Alkaline Phosphatase: 78 IU/L (ref 44–121)
BUN/Creatinine Ratio: 9 — ABNORMAL LOW (ref 10–24)
BUN: 10 mg/dL (ref 8–27)
Bilirubin Total: 0.9 mg/dL (ref 0.0–1.2)
CO2: 23 mmol/L (ref 20–29)
Calcium: 9.2 mg/dL (ref 8.6–10.2)
Chloride: 99 mmol/L (ref 96–106)
Creatinine, Ser: 1.11 mg/dL (ref 0.76–1.27)
Globulin, Total: 2.4 g/dL (ref 1.5–4.5)
Glucose: 87 mg/dL (ref 70–99)
Potassium: 3.7 mmol/L (ref 3.5–5.2)
Sodium: 139 mmol/L (ref 134–144)
Total Protein: 6.9 g/dL (ref 6.0–8.5)
eGFR: 73 mL/min/{1.73_m2} (ref >59)

## 2022-06-14 LAB — HEMOGLOBIN A1C
Est. average glucose Bld gHb Est-mCnc: 111 mg/dL
Hgb A1c MFr Bld: 5.5 % (ref 4.8–5.6)

## 2022-06-14 LAB — LIPID PANEL
Chol/HDL Ratio: 3.9 ratio (ref 0.0–5.0)
Cholesterol, Total: 166 mg/dL (ref 100–199)
HDL: 43 mg/dL (ref >39)
LDL Chol Calc (NIH): 105 mg/dL — ABNORMAL HIGH (ref 0–99)
Triglycerides: 95 mg/dL (ref 0–149)
VLDL Cholesterol Cal: 18 mg/dL (ref 5–40)

## 2022-06-14 LAB — PSA: Prostate Specific Ag, Serum: 0.9 ng/mL (ref 0.0–4.0)

## 2022-06-27 ENCOUNTER — Inpatient Hospital Stay: Payer: No Typology Code available for payment source | Attending: Hematology

## 2022-06-27 ENCOUNTER — Ambulatory Visit (HOSPITAL_COMMUNITY)
Admission: RE | Admit: 2022-06-27 | Discharge: 2022-06-27 | Disposition: A | Discharge status: Home / Self Care | Payer: No Typology Code available for payment source | Source: Ambulatory Visit | Attending: Physician Assistant | Admitting: Physician Assistant

## 2022-06-27 DIAGNOSIS — D573 Sickle-cell trait: Secondary | ICD-10-CM | POA: Diagnosis not present

## 2022-06-27 DIAGNOSIS — D751 Secondary polycythemia: Secondary | ICD-10-CM | POA: Diagnosis not present

## 2022-06-27 DIAGNOSIS — Z87891 Personal history of nicotine dependence: Secondary | ICD-10-CM | POA: Insufficient documentation

## 2022-06-27 DIAGNOSIS — Z800 Family history of malignant neoplasm of digestive organs: Secondary | ICD-10-CM | POA: Insufficient documentation

## 2022-06-27 DIAGNOSIS — D696 Thrombocytopenia, unspecified: Secondary | ICD-10-CM | POA: Diagnosis present

## 2022-06-27 DIAGNOSIS — D709 Neutropenia, unspecified: Secondary | ICD-10-CM | POA: Diagnosis not present

## 2022-06-27 DIAGNOSIS — G4733 Obstructive sleep apnea (adult) (pediatric): Secondary | ICD-10-CM | POA: Insufficient documentation

## 2022-06-27 DIAGNOSIS — E611 Iron deficiency: Secondary | ICD-10-CM | POA: Diagnosis present

## 2022-06-27 DIAGNOSIS — Z8041 Family history of malignant neoplasm of ovary: Secondary | ICD-10-CM | POA: Insufficient documentation

## 2022-06-27 LAB — COMPREHENSIVE METABOLIC PANEL
ALT: 25 U/L (ref 0–44)
AST: 23 U/L (ref 15–41)
Albumin: 3.9 g/dL (ref 3.5–5.0)
Alkaline Phosphatase: 61 U/L (ref 38–126)
Anion gap: 7 (ref 5–15)
BUN: 11 mg/dL (ref 8–23)
CO2: 26 mmol/L (ref 22–32)
Calcium: 8.4 mg/dL — ABNORMAL LOW (ref 8.9–10.3)
Chloride: 101 mmol/L (ref 98–111)
Creatinine, Ser: 0.99 mg/dL (ref 0.61–1.24)
GFR, Estimated: >60 mL/min (ref >60)
Glucose, Bld: 104 mg/dL — ABNORMAL HIGH (ref 70–99)
Potassium: 3.6 mmol/L (ref 3.5–5.1)
Sodium: 134 mmol/L — ABNORMAL LOW (ref 135–145)
Total Bilirubin: 0.7 mg/dL (ref 0.3–1.2)
Total Protein: 6.8 g/dL (ref 6.5–8.1)

## 2022-06-27 LAB — CBC WITH DIFFERENTIAL/PLATELET
Abs Immature Granulocytes: 0 10*3/uL (ref 0.00–0.07)
Basophils Absolute: 0 10*3/uL (ref 0.0–0.1)
Basophils Relative: 1 %
Eosinophils Absolute: 0.1 10*3/uL (ref 0.0–0.5)
Eosinophils Relative: 3 %
HCT: 52.2 % — ABNORMAL HIGH (ref 39.0–52.0)
Hemoglobin: 17.8 g/dL — ABNORMAL HIGH (ref 13.0–17.0)
Immature Granulocytes: 0 %
Lymphocytes Relative: 37 %
Lymphs Abs: 0.7 10*3/uL (ref 0.7–4.0)
MCH: 28.3 pg (ref 26.0–34.0)
MCHC: 34.1 g/dL (ref 30.0–36.0)
MCV: 82.9 fL (ref 80.0–100.0)
Monocytes Absolute: 0.2 10*3/uL (ref 0.1–1.0)
Monocytes Relative: 11 %
Neutro Abs: 1 10*3/uL — ABNORMAL LOW (ref 1.7–7.7)
Neutrophils Relative %: 48 %
Platelets: 123 10*3/uL — ABNORMAL LOW (ref 150–400)
RBC: 6.3 MIL/uL — ABNORMAL HIGH (ref 4.22–5.81)
RDW: 12.7 % (ref 11.5–15.5)
WBC: 2 10*3/uL — ABNORMAL LOW (ref 4.0–10.5)
nRBC: 0 % (ref 0.0–0.2)

## 2022-06-27 LAB — IRON AND TIBC
Iron: 69 ug/dL (ref 45–182)
Saturation Ratios: 19 % (ref 17.9–39.5)
TIBC: 369 ug/dL (ref 250–450)
UIBC: 300 ug/dL

## 2022-06-27 LAB — FERRITIN: Ferritin: 27 ng/mL (ref 24–336)

## 2022-06-27 LAB — IMMATURE PLATELET FRACTION: Immature Platelet Fraction: 3.8 % (ref 1.2–8.6)

## 2022-06-27 LAB — HEPATITIS B SURFACE ANTIGEN: Hepatitis B Surface Ag: NONREACTIVE

## 2022-06-27 LAB — HEPATITIS B SURFACE ANTIBODY,QUALITATIVE: Hep B S Ab: NONREACTIVE

## 2022-06-27 LAB — HEPATITIS B CORE ANTIBODY, TOTAL: Hep B Core Total Ab: NONREACTIVE

## 2022-06-28 LAB — HCV INTERPRETATION

## 2022-06-28 LAB — HCV AB W REFLEX TO QUANT PCR: HCV Ab: NONREACTIVE

## 2022-07-03 ENCOUNTER — Ambulatory Visit (HOSPITAL_COMMUNITY)
Admission: RE | Admit: 2022-07-03 | Discharge: 2022-07-03 | Disposition: A | Discharge status: Home / Self Care | Payer: No Typology Code available for payment source | Source: Ambulatory Visit | Attending: Physician Assistant | Admitting: Physician Assistant

## 2022-07-03 DIAGNOSIS — M25562 Pain in left knee: Secondary | ICD-10-CM | POA: Diagnosis not present

## 2022-07-03 NOTE — Progress Notes (Unsigned)
[facility] [address], [place] [address]   CLINIC:  Medical Oncology/Hematology  PCP:  Clinic, [facility] [address] [place] [address] [phone]   REASON FOR VISIT:  Follow-up for sickle cell trait, iron deficiency without anemia, mild thrombocytopenia   PRIOR THERAPY: None   CURRENT THERAPY: Ferrous bisglycinate  INTERVAL HISTORY:   Andrew Fisher 67 y.o. male returns for routine follow-up of iron deficiency, thrombocytopenia, and sickle cell trait.  He was last seen by Rojelio Brenner PA-C on 02/26/2022.  At today's visit, he reports feeling somewhat poorly due to ongoing fatigue, arthralgia, and neuropathic pain.  He "feels drained and tired all the time," reports that he is not sleeping well due to pain, PTSD, and poor fitting CPAP mask.  He continues to take iron tablet (ferrous bisglycinate) daily.  No rectal bleeding, melena, easy bruising, or petechial rash.  No B symptoms or frequent infections.  He reports intermittent shortness of breath from asthma.  Occasional chest pain when he lies down after eating, which he attributes to heartburn.  He has ongoing dizziness with position changes that he states feels similar to his previous bouts of vertigo.  He has occasional headaches.   No lightheadedness or syncope.  He has 40% energy and 70% appetite. He endorses that he is maintaining a stable weight.  ASSESSMENT & PLAN:  1.  Thrombocytopenia, mild and intermittent - Mild intermittent thrombocytopenia since February 2021 with platelets ranging from 101 to normal. - Workup of thrombocytopenia unremarkable: Normal nutritional panel (02/26/2022) with normal copper, iron, folate, methylmalonic acid, B12 Normal immature platelet fraction at 3.8% Normal SPEP (November 2022) Negative ANA and rheumatoid factor (November 2021) Negative H. pylori IgG antibodies (testing done by [facility] December 2023) Negative for hepatitis B,  hepatitis C. - US abdomen (06/27/2022): Negative for hepatosplenomegaly. - He denies any bleeding, bruising, or petechial rash. - CBC/D (06/27/2022): Platelets 123 - DIFFERENTIAL DIAGNOSIS includes ITP versus early signs of MDS - PLAN: Consider bone marrow biopsy if any worsening of his platelet count. - Monthly CBC/D with office visit in 3 months  2.  Neutropenia: - Chronic neutropenia since at least 2017 with baseline WBC 2.5-3.5 and ANC 1.3-1.5 - SPEP negative.  Normal folate, B12 and methylmalonic acid.  LDH normal. - He does not have any recurrent infections. - Most recent CBC (06/27/2022) with WBC 2.0/ANC 1.0, decreased from usual baseline. 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 - Monthly CBC/D with office visit in 3 months  3.  Iron deficiency without anemia  - EGD (11/12/2019): Normal stomach and duodenum.  No significant abnormalities in the esophagus. - Colonoscopy (08/18/2018): Colon polyp, otherwise unremarkable. - Taking daily iron supplementation (ferrous bisglycinate) since November 2022 - No hematemesis, hematochezia, or melena - Most recent labs (06/27/2022): Hgb 17.8/MCV 82.9, ferritin 27, iron saturation 19% - PLAN: Continue oral iron supplementation.  No need for IV iron at this time, but will consider IV iron if it is still low at follow-up - Repeat CBC/iron panel and RTC in 3 months.  4.  Erythrocytosis - He is noted to have mild intermittent erythrocytosis, likely related to OSA.  Also has some chronic elevations in RBCs with microcytosis that is attributable to his sickle cell trait. - Most recent Hgb 17.8/hematocrit 52.2% (06/27/2022) - He uses CPAP for OSA, but reports that  he "thinks the machine or mask is not working right" - He uses topical testosterone supplements - No vasomotor symptoms or aquagenic  pruritus - PLAN: Suspect secondary erythrocytosis from sleep apnea and poor compliance with CPAP as well as testosterone supplementation.   If any significant persistent erythrocytosis, would consider MPN workup.   5.  Sickle cell trait - Hemoglobin electrophoresis (02/02/2021) consistent with sickle cell trait (heterozygous) - Patient is asymptomatic, has never had any symptoms suggestive of acute pain crisis or venoocclusive events.   - Discussed with patient regarding sickle cell trait: Benign carrier condition that rarely results in significant clinical issues. In very rare cases patients can have symptoms of acute pain and venoocclusive disease. At risk for some blood sickling in cases of significant hypoxia (such as high-altitude and pressurized aircraft, very strenuous exercise with dehydration, etc.). Children and grandchildren would benefit from being screened for sickle cell trait to assist with future reproductive decisions. - PLAN: No indication for ongoing hematology follow-up of asymptomatic sickle cell trait.   6.  Social/family history: - He worked in the [employer] for 23 years and retired as a first Chief Strategy Officer.  No clear exposure to chemicals.  Quit smoking in 1979. - He has sleep apnea and has been using CPAP since 2006. - Son had HBs trait.  Mother is a Jehovah witness and also had colon cancer.  Maternal aunt died of ovarian cancer in her 33s.  PLAN SUMMARY: >> Monthly CBC/differential - [[follow trend of WBC and platelets and bring back sooner if any major changes]] >> Labs in 3 months = CBC/D, CMP, LDH, ferritin, iron/TIBC     REVIEW OF SYSTEMS:   Review of Systems  Constitutional:  Positive for fatigue. Negative for appetite change, chills, diaphoresis, fever and unexpected weight change.  HENT:   Negative for lump/mass and nosebleeds.   Eyes:  Negative for eye problems.  Respiratory:  Positive for shortness of breath. Negative for cough and hemoptysis.    Cardiovascular:  Positive for palpitations. Negative for chest pain and leg swelling.  Gastrointestinal:  Positive for constipation and nausea. Negative for abdominal pain, blood in stool, diarrhea and vomiting.  Genitourinary:  Negative for hematuria.   Musculoskeletal:  Positive for arthralgias.  Skin: Negative.   Neurological:  Positive for dizziness, headaches and numbness. Negative for light-headedness.  Hematological:  Does not bruise/bleed easily.     PHYSICAL EXAM:  ECOG PERFORMANCE STATUS: 1 - Symptomatic but completely ambulatory  There were no vitals filed for this visit. There were no vitals filed for this visit. Physical Exam Constitutional:      Appearance: Normal appearance. He is obese.  Cardiovascular:     Heart sounds: Normal heart sounds.  Pulmonary:     Breath sounds: Normal breath sounds.  Neurological:     General: No focal deficit present.     Mental Status: Mental status is at baseline.  Psychiatric:        Behavior: Behavior normal. Behavior is cooperative.    PAST MEDICAL/SURGICAL HISTORY:  Past Medical History:  Diagnosis Date   Arthritis    Asthma    GERD (gastroesophageal reflux disease)    Hearing loss    Hemorrhoids    Hyperlipidemia    Low testosterone in male    Peripheral neuropathy    Sleep apnea    wears cpap   Sore throat    Wears glasses    Wears hearing aid in both ears    Past Surgical History:  Procedure  Laterality Date   ANTERIOR CERVICAL DECOMP/DISCECTOMY FUSION N/A 01/09/2017   Procedure: ANTERIOR CERVICAL DECOMPRESSION FUSION, CERVICAL FOUR-FIVE, CERVICAL FIVE-SIX INSTRUMENTATION AND ALLOGRAFT;  Surgeon: Estill Bambergumonski, Mark, MD;  Location: [facility] OR;  Service: Orthopedics;  Laterality: N/A;  ANTERIOR CERVICAL DECOMPRESSION FUSION, CERVICAL FOUR-FIVE, CERVICAL FIVE-SIX INSTRUMENTATION AND ALLOGRAFT   APPENDECTOMY     BIOPSY  11/12/2019   Procedure: BIOPSY;  Surgeon: Jeani HawkingHung, Patrick, MD;  Location: [facility] ENDOSCOPY;  Service: Endoscopy;;    COLONOSCOPY     ESOPHAGOGASTRODUODENOSCOPY (EGD) WITH PROPOFOL N/A 11/12/2019   Procedure: ESOPHAGOGASTRODUODENOSCOPY (EGD) WITH PROPOFOL;  Surgeon: Jeani HawkingHung, Patrick, MD;  Location: [facility] ENDOSCOPY;  Service: Endoscopy;  Laterality: N/A;   NECK SURGERY     SAVORY DILATION N/A 11/12/2019   Procedure: SAVORY DILATION;  Surgeon: Jeani HawkingHung, Patrick, MD;  Location: [facility] ENDOSCOPY;  Service: Endoscopy;  Laterality: N/A;   SHOULDER SURGERY  2005   rotator cuff and tendons repaired    SOCIAL HISTORY:  Social History   Socioeconomic History   Marital status: Married    Spouse name: Not on file   Number of children: Not on file   Years of education: Not on file   Highest education level: Not on file  Occupational History   Not on file  Tobacco Use   Smoking status: Former    Packs/day: 0.25    Years: 6.00    Additional pack years: 0.00    Total pack years: 1.50    Types: Cigarettes   Smokeless tobacco: Never  Vaping Use   Vaping Use: Never used  Substance and Sexual Activity   Alcohol use: Not Currently   Drug use: No   Sexual activity: Not on file  Other Topics Concern   Not on file  Social History Narrative   Not on file   Social Determinants of Health   Financial Resource Strain: Low Risk  (12/12/2021)   Overall Financial Resource Strain (CARDIA)    Difficulty of Paying Living Expenses: Not hard at all  Food Insecurity: No Food Insecurity (12/12/2021)   Hunger Vital Sign    Worried About Running Out of Food in the Last Year: Never true    Ran Out of Food in the Last Year: Never true  Transportation Needs: No Transportation Needs (12/12/2021)   PRAPARE - Administrator, Civil ServiceTransportation    Lack of Transportation (Medical): No    Lack of Transportation (Non-Medical): No  Physical Activity: Sufficiently Active (12/12/2021)   Exercise Vital Sign    Days of Exercise per Week: 5 days    Minutes of Exercise per Session: 50 min  Stress: Stress Concern Present (12/12/2021)   [employer] - Occupational Stress Questionnaire    Feeling of Stress : To some extent  Social Connections: Not on file  Intimate Partner Violence: Not on file    FAMILY HISTORY:  Family History  Problem Relation Age of Onset   Diabetes Mother    Hypertension Mother    Healthy Father    Cancer Other     CURRENT MEDICATIONS:  Outpatient Encounter Medications as of 07/04/2022  Medication Sig   albuterol (VENTOLIN HFA) 108 (90 Base) MCG/ACT inhaler USE 1 TO 2 INHALATIONS EVERY 6 HOURS AS NEEDED FOR WHEEZING OR SHORTNESS OF BREATH (Patient taking differently: Inhale 2 puffs into the lungs every 6 (six) hours as needed for wheezing or shortness of breath.)   amLODipine (NORVASC) 10 MG tablet Take 10 mg by mouth daily.   aspirin EC 81 MG tablet Take 81 mg  by mouth daily. (Patient not taking: Reported on 06/13/2022)   atorvastatin (LIPITOR) 10 MG tablet TAKE 1 TABLET DAILY (Patient taking differently: Take 10 mg by mouth daily.)   benzonatate (TESSALON) 100 MG capsule Take 1 capsule (100 mg total) by mouth 2 (two) times daily as needed for cough. (Patient not taking: Reported on 06/13/2022)   Blood Glucose Monitoring Suppl KIT Use as directed to check blood sugars 1 time per day dx:e11.22   Buprenorphine HCl 450 MCG FILM Take 300 mcg by mouth every 12 (twelve) hours.    Buprenorphine HCl 450 MCG FILM PLACE ONE FILM TO CHEEK AND GUM UNTIL DISSOLVED EVERY 12 HOURS   EPINEPHrine 0.3 mg/0.3 mL IJ SOAJ injection Inject 0.3 mLs (0.3 mg total) into the muscle as needed for anaphylaxis. (Patient taking differently: Inject 0.3 mg into the muscle daily as needed for anaphylaxis.)   fluticasone (FLONASE) 50 MCG/ACT nasal spray Place 2 sprays into both nostrils daily.   gabapentin (NEURONTIN) 300 MG capsule Take 300 mg by mouth 4 (four) times daily.   glucose blood test strip Use as directed to check blood sugars 1 time per day dx:e11.22   hydrocortisone 2.5 % cream SMARTSIG:1 Topical Daily   Lancets 33G MISC Use  as directed to check blood sugars 1 time per day dx:e11.22   lansoprazole (PREVACID) 30 MG capsule Take 30 mg by mouth daily.    levalbuterol (XOPENEX HFA) 45 MCG/ACT inhaler Inhale into the lungs.   LINZESS 145 MCG CAPS capsule Take 145 mcg by mouth daily as needed (constipation).    loratadine (CLARITIN) 10 MG tablet Take 10 mg by mouth daily.   magnesium oxide (MAG-OX) 400 MG tablet Take 400 mg by mouth daily.   meloxicam (MOBIC) 15 MG tablet Take by mouth. (Patient not taking: Reported on 06/13/2022)   MI-ACID GAS RELIEF 80 MG chewable tablet    montelukast (SINGULAIR) 10 MG tablet TAKE 1 TABLET DAILY (Patient taking differently: Take 10 mg by mouth daily.)   neomycin-polymyxin-hydrocortisone (CORTISPORIN) 3.5-10000-1 OTIC suspension Place 4 drops into both ears 4 (four) times daily. X 7 days   sertraline (ZOLOFT) 100 MG tablet Take 100 mg by mouth daily.   tadalafil (CIALIS) 5 MG tablet Take 5 mg by mouth daily.    tamsulosin (FLOMAX) 0.4 MG CAPS capsule Take 0.4 mg by mouth at bedtime.  (Patient not taking: Reported on 06/13/2022)   temazepam (RESTORIL) 15 MG capsule Take by mouth.   Testosterone 30 MG/ACT SOLN Apply 30 mg topically daily. Apply under each arm   tiZANidine (ZANAFLEX) 4 MG tablet Take 4 mg by mouth 3 (three) times daily.   urea (CARMOL) 20 % cream Apply 1 application topically daily as needed (Feet pain).    No facility-administered encounter medications on file as of 07/04/2022.    ALLERGIES:  No Known Allergies  LABORATORY DATA:  I have reviewed the labs as listed.  CBC    Component Value Date/Time   WBC 2.0 (L) 06/27/2022 1315   RBC 6.30 (H) 06/27/2022 1315   HGB 17.8 (H) 06/27/2022 1315   HGB 17.4 12/12/2021 1545   HCT 52.2 (H) 06/27/2022 1315   HCT 51.4 (H) 12/12/2021 1545   PLT 123 (L) 06/27/2022 1315   PLT 110 (L) 12/12/2021 1545   MCV 82.9 06/27/2022 1315   MCV 83 12/12/2021 1545   MCH 28.3 06/27/2022 1315   MCHC 34.1 06/27/2022 1315   RDW 12.7  06/27/2022 1315   RDW 13.9 12/12/2021 1545  LYMPHSABS 0.7 06/27/2022 1315   LYMPHSABS 1.0 11/23/2020 1512   MONOABS 0.2 06/27/2022 1315   EOSABS 0.1 06/27/2022 1315   EOSABS 0.3 11/23/2020 1512   BASOSABS 0.0 06/27/2022 1315   BASOSABS 0.0 11/23/2020 1512      Latest Ref Rng & Units 06/27/2022    1:15 PM 06/13/2022   12:20 PM 12/12/2021    3:45 PM  CMP  Glucose 70 - 99 mg/dL 161  87  97   BUN 8 - 23 mg/dL 11  10  12    Creatinine 0.61 - 1.24 mg/dL 0.96  0.45  4.09   Sodium 135 - 145 mmol/L 134  139  141   Potassium 3.5 - 5.1 mmol/L 3.6  3.7  4.1   Chloride 98 - 111 mmol/L 101  99  101   CO2 22 - 32 mmol/L 26  23  26    Calcium 8.9 - 10.3 mg/dL 8.4  9.2  9.5   Total Protein 6.5 - 8.1 g/dL 6.8  6.9  7.2   Total Bilirubin 0.3 - 1.2 mg/dL 0.7  0.9  0.9   Alkaline Phos 38 - 126 U/L 61  78  88   AST 15 - 41 U/L 23  17  30    ALT 0 - 44 U/L 25  27  49     DIAGNOSTIC IMAGING:  I have independently reviewed the relevant imaging and discussed with the patient.   WRAP UP:  All questions were answered. The patient knows to call the clinic with any problems, questions or concerns.  Medical decision making: Moderate  Time spent on visit: I spent 25 minutes counseling the patient face to face. The total time spent in the appointment was 40 minutes and more than 50% was on counseling.  Carnella Guadalajara, PA-C  07/04/2022 6:38 PM

## 2022-07-04 ENCOUNTER — Inpatient Hospital Stay (HOSPITAL_BASED_OUTPATIENT_CLINIC_OR_DEPARTMENT_OTHER): Payer: No Typology Code available for payment source | Admitting: Physician Assistant

## 2022-07-04 DIAGNOSIS — D573 Sickle-cell trait: Secondary | ICD-10-CM

## 2022-07-04 DIAGNOSIS — D509 Iron deficiency anemia, unspecified: Secondary | ICD-10-CM

## 2022-07-04 DIAGNOSIS — D696 Thrombocytopenia, unspecified: Secondary | ICD-10-CM | POA: Diagnosis not present

## 2022-07-04 DIAGNOSIS — D708 Other neutropenia: Secondary | ICD-10-CM

## 2022-07-04 DIAGNOSIS — E611 Iron deficiency: Secondary | ICD-10-CM | POA: Diagnosis not present

## 2022-07-04 NOTE — Patient Instructions (Signed)
[facility] at [facility] **VISIT SUMMARY & IMPORTANT INSTRUCTIONS **   You were seen today by Rojelio Brenner PA-C for your follow-up visit.    LOW PLATELETS & LOW WHITE BLOOD CELLS You continue to have mildly low platelets and low white blood cells. We will continue to watch this closely with blood counts once a month. We will see you for follow-up visit in 3 months  IRON DEFICIENCY Your iron levels remain slightly low.  Continue to take iron tablet every day.  FOLLOW-UP APPOINTMENT: Follow-up visit in 3 months  ** Thank you for trusting me with your healthcare!  I strive to provide all of my patients with quality care at each visit.  If you receive a survey for this visit, I would be so grateful to you for taking the time to provide feedback.  Thank you in advance!  ~ Oona Trammel                   Dr. Doreatha Massed   &   Rojelio Brenner, PA-C   - - - - - - - - - - - - - - - - - -    Thank you for choosing [facility] at [facility] to provide your oncology and hematology care.  To afford each patient quality time with our provider, please arrive at least 15 minutes before your scheduled appointment time.   If you have a lab appointment with the Cancer Center please come in thru the Main Entrance and check in at the main information desk.  You need to re-schedule your appointment should you arrive 10 or more minutes late.  We strive to give you quality time with our providers, and arriving late affects you and other patients whose appointments are after yours.  Also, if you no show three or more times for appointments you may be dismissed from the clinic at the providers discretion.     Again, thank you for choosing [facility].  Our hope is that these requests will decrease the amount of time that you wait before being seen by our physicians.       _____________________________________________________________  Should  you have questions after your visit to [facility], please contact our office at [phone] and follow the prompts.  Our office hours are 8:00 a.m. and 4:30 p.m. Monday - Friday.  Please note that voicemails left after 4:00 p.m. may not be returned until the following business day.  We are closed weekends and major holidays.  You do have access to a nurse 24-7, just call the main number to the clinic [phone] and do not press any options, hold on the line and a nurse will answer the phone.    For prescription refill requests, have your pharmacy contact our office and allow 72 hours.

## 2022-07-22 ENCOUNTER — Other Ambulatory Visit (HOSPITAL_COMMUNITY): Payer: Self-pay | Admitting: Physician Assistant

## 2022-07-22 DIAGNOSIS — M5442 Lumbago with sciatica, left side: Secondary | ICD-10-CM

## 2022-08-05 ENCOUNTER — Other Ambulatory Visit: Payer: Self-pay

## 2022-08-05 DIAGNOSIS — D696 Thrombocytopenia, unspecified: Secondary | ICD-10-CM

## 2022-08-06 ENCOUNTER — Inpatient Hospital Stay: Payer: Non-veteran care | Attending: Physician Assistant

## 2022-08-06 DIAGNOSIS — D709 Neutropenia, unspecified: Secondary | ICD-10-CM | POA: Insufficient documentation

## 2022-08-06 DIAGNOSIS — E611 Iron deficiency: Secondary | ICD-10-CM | POA: Insufficient documentation

## 2022-08-06 DIAGNOSIS — D696 Thrombocytopenia, unspecified: Secondary | ICD-10-CM | POA: Diagnosis not present

## 2022-08-06 DIAGNOSIS — D751 Secondary polycythemia: Secondary | ICD-10-CM | POA: Insufficient documentation

## 2022-08-06 DIAGNOSIS — D573 Sickle-cell trait: Secondary | ICD-10-CM | POA: Insufficient documentation

## 2022-08-06 LAB — CBC WITH DIFFERENTIAL/PLATELET
Abs Immature Granulocytes: 0.01 10*3/uL (ref 0.00–0.07)
Basophils Absolute: 0 10*3/uL (ref 0.0–0.1)
Basophils Relative: 0 %
Eosinophils Absolute: 0.1 10*3/uL (ref 0.0–0.5)
Eosinophils Relative: 3 %
HCT: 52.1 % — ABNORMAL HIGH (ref 39.0–52.0)
Hemoglobin: 17.5 g/dL — ABNORMAL HIGH (ref 13.0–17.0)
Immature Granulocytes: 0 %
Lymphocytes Relative: 40 %
Lymphs Abs: 1.3 10*3/uL (ref 0.7–4.0)
MCH: 27.9 pg (ref 26.0–34.0)
MCHC: 33.6 g/dL (ref 30.0–36.0)
MCV: 83.1 fL (ref 80.0–100.0)
Monocytes Absolute: 0.4 10*3/uL (ref 0.1–1.0)
Monocytes Relative: 13 %
Neutro Abs: 1.4 10*3/uL — ABNORMAL LOW (ref 1.7–7.7)
Neutrophils Relative %: 44 %
Platelets: 125 10*3/uL — ABNORMAL LOW (ref 150–400)
RBC: 6.27 MIL/uL — ABNORMAL HIGH (ref 4.22–5.81)
RDW: 13.9 % (ref 11.5–15.5)
WBC: 3.2 10*3/uL — ABNORMAL LOW (ref 4.0–10.5)
nRBC: 0 % (ref 0.0–0.2)

## 2022-08-14 ENCOUNTER — Ambulatory Visit (HOSPITAL_COMMUNITY)
Admission: RE | Admit: 2022-08-14 | Discharge: 2022-08-14 | Disposition: A | Discharge status: Home / Self Care | Payer: No Typology Code available for payment source | Source: Ambulatory Visit | Attending: Physician Assistant | Admitting: Physician Assistant

## 2022-08-14 DIAGNOSIS — M5442 Lumbago with sciatica, left side: Secondary | ICD-10-CM | POA: Diagnosis not present

## 2022-09-04 ENCOUNTER — Other Ambulatory Visit: Payer: Self-pay

## 2022-09-04 DIAGNOSIS — D696 Thrombocytopenia, unspecified: Secondary | ICD-10-CM

## 2022-09-05 ENCOUNTER — Inpatient Hospital Stay: Payer: No Typology Code available for payment source | Attending: Physician Assistant

## 2022-09-05 DIAGNOSIS — D696 Thrombocytopenia, unspecified: Secondary | ICD-10-CM | POA: Insufficient documentation

## 2022-09-05 DIAGNOSIS — E611 Iron deficiency: Secondary | ICD-10-CM | POA: Diagnosis present

## 2022-09-05 DIAGNOSIS — D573 Sickle-cell trait: Secondary | ICD-10-CM | POA: Insufficient documentation

## 2022-09-05 LAB — CBC WITH DIFFERENTIAL/PLATELET
Abs Immature Granulocytes: 0 10*3/uL (ref 0.00–0.07)
Basophils Absolute: 0 10*3/uL (ref 0.0–0.1)
Basophils Relative: 0 %
Eosinophils Absolute: 0.1 10*3/uL (ref 0.0–0.5)
Eosinophils Relative: 5 %
HCT: 53.7 % — ABNORMAL HIGH (ref 39.0–52.0)
Hemoglobin: 18.1 g/dL — ABNORMAL HIGH (ref 13.0–17.0)
Immature Granulocytes: 0 %
Lymphocytes Relative: 40 %
Lymphs Abs: 1.1 10*3/uL (ref 0.7–4.0)
MCH: 27.8 pg (ref 26.0–34.0)
MCHC: 33.7 g/dL (ref 30.0–36.0)
MCV: 82.4 fL (ref 80.0–100.0)
Monocytes Absolute: 0.3 10*3/uL (ref 0.1–1.0)
Monocytes Relative: 10 %
Neutro Abs: 1.2 10*3/uL — ABNORMAL LOW (ref 1.7–7.7)
Neutrophils Relative %: 45 %
Platelets: 127 10*3/uL — ABNORMAL LOW (ref 150–400)
RBC: 6.52 MIL/uL — ABNORMAL HIGH (ref 4.22–5.81)
RDW: 15.3 % (ref 11.5–15.5)
WBC: 2.6 10*3/uL — ABNORMAL LOW (ref 4.0–10.5)
nRBC: 0 % (ref 0.0–0.2)

## 2022-10-02 ENCOUNTER — Other Ambulatory Visit: Payer: Self-pay

## 2022-10-02 DIAGNOSIS — D696 Thrombocytopenia, unspecified: Secondary | ICD-10-CM

## 2022-10-03 ENCOUNTER — Inpatient Hospital Stay: Payer: No Typology Code available for payment source | Attending: Physician Assistant

## 2022-10-03 DIAGNOSIS — D751 Secondary polycythemia: Secondary | ICD-10-CM | POA: Insufficient documentation

## 2022-10-03 DIAGNOSIS — G4733 Obstructive sleep apnea (adult) (pediatric): Secondary | ICD-10-CM | POA: Diagnosis not present

## 2022-10-03 DIAGNOSIS — Z800 Family history of malignant neoplasm of digestive organs: Secondary | ICD-10-CM | POA: Diagnosis not present

## 2022-10-03 DIAGNOSIS — Z87891 Personal history of nicotine dependence: Secondary | ICD-10-CM | POA: Diagnosis not present

## 2022-10-03 DIAGNOSIS — D696 Thrombocytopenia, unspecified: Secondary | ICD-10-CM | POA: Diagnosis not present

## 2022-10-03 DIAGNOSIS — D573 Sickle-cell trait: Secondary | ICD-10-CM | POA: Insufficient documentation

## 2022-10-03 DIAGNOSIS — Z8041 Family history of malignant neoplasm of ovary: Secondary | ICD-10-CM | POA: Insufficient documentation

## 2022-10-03 DIAGNOSIS — E611 Iron deficiency: Secondary | ICD-10-CM | POA: Insufficient documentation

## 2022-10-03 DIAGNOSIS — D709 Neutropenia, unspecified: Secondary | ICD-10-CM | POA: Insufficient documentation

## 2022-10-03 LAB — CBC WITH DIFFERENTIAL/PLATELET
Abs Immature Granulocytes: 0.01 10*3/uL (ref 0.00–0.07)
Basophils Absolute: 0 10*3/uL (ref 0.0–0.1)
Basophils Relative: 1 %
Eosinophils Absolute: 0.1 10*3/uL (ref 0.0–0.5)
Eosinophils Relative: 3 %
HCT: 52.4 % — ABNORMAL HIGH (ref 39.0–52.0)
Hemoglobin: 17.5 g/dL — ABNORMAL HIGH (ref 13.0–17.0)
Immature Granulocytes: 0 %
Lymphocytes Relative: 28 %
Lymphs Abs: 1 10*3/uL (ref 0.7–4.0)
MCH: 27.9 pg (ref 26.0–34.0)
MCHC: 33.4 g/dL (ref 30.0–36.0)
MCV: 83.4 fL (ref 80.0–100.0)
Monocytes Absolute: 0.4 10*3/uL (ref 0.1–1.0)
Monocytes Relative: 11 %
Neutro Abs: 2.1 10*3/uL (ref 1.7–7.7)
Neutrophils Relative %: 57 %
Platelets: 117 10*3/uL — ABNORMAL LOW (ref 150–400)
RBC: 6.28 MIL/uL — ABNORMAL HIGH (ref 4.22–5.81)
RDW: 14.6 % (ref 11.5–15.5)
WBC: 3.6 10*3/uL — ABNORMAL LOW (ref 4.0–10.5)
nRBC: 0 % (ref 0.0–0.2)

## 2022-10-03 LAB — IRON AND TIBC
Iron: 128 ug/dL (ref 45–182)
Saturation Ratios: 39 % (ref 17.9–39.5)
TIBC: 333 ug/dL (ref 250–450)
UIBC: 205 ug/dL

## 2022-10-03 LAB — COMPREHENSIVE METABOLIC PANEL
ALT: 33 U/L (ref 0–44)
AST: 21 U/L (ref 15–41)
Albumin: 3.9 g/dL (ref 3.5–5.0)
Alkaline Phosphatase: 70 U/L (ref 38–126)
Anion gap: 9 (ref 5–15)
BUN: 13 mg/dL (ref 8–23)
CO2: 26 mmol/L (ref 22–32)
Calcium: 8.9 mg/dL (ref 8.9–10.3)
Chloride: 101 mmol/L (ref 98–111)
Creatinine, Ser: 1.07 mg/dL (ref 0.61–1.24)
GFR, Estimated: >60 mL/min (ref >60)
Glucose, Bld: 117 mg/dL — ABNORMAL HIGH (ref 70–99)
Potassium: 3.3 mmol/L — ABNORMAL LOW (ref 3.5–5.1)
Sodium: 136 mmol/L (ref 135–145)
Total Bilirubin: 0.9 mg/dL (ref 0.3–1.2)
Total Protein: 6.8 g/dL (ref 6.5–8.1)

## 2022-10-03 LAB — LACTATE DEHYDROGENASE: LDH: 135 U/L (ref 98–192)

## 2022-10-03 LAB — FERRITIN: Ferritin: 77 ng/mL (ref 24–336)

## 2022-10-08 NOTE — Progress Notes (Unsigned)
[facility] [address], [place] [address]   CLINIC:  Medical Oncology/Hematology  PCP:  Clinic, [facility] [address] [facility] [place] [address] [phone]    *** RESUME PREP BELOW *** >> [facility] - HAD BMBX @ [facility] IN MAY 2024     REASON FOR VISIT:  Follow-up for sickle cell trait, iron deficiency without anemia, mild thrombocytopenia   PRIOR THERAPY: None   CURRENT THERAPY: Ferrous bisglycinate  INTERVAL HISTORY:   Mr. [place] 67 y.o. male returns for routine follow-up of iron deficiency, thrombocytopenia, and sickle cell trait.  He was last seen by Rojelio Brenner PA-C on 02/26/2022.  At today's visit, he reports feeling somewhat poorly due to ongoing fatigue, arthralgia, and neuropathic pain.  He "feels drained and tired all the time," reports that he is not sleeping well due to pain, PTSD, and poor fitting CPAP mask.  He continues to take iron tablet (ferrous bisglycinate) daily.  No rectal bleeding, melena, easy bruising, or petechial rash.  No B symptoms or frequent infections.  He reports intermittent shortness of breath from asthma.  Occasional chest pain when he lies down after eating, which he attributes to heartburn.  He has ongoing dizziness with position changes that he states feels similar to his previous bouts of vertigo.  He has occasional headaches.   No lightheadedness or syncope.  He has 40% energy and 70% appetite. He endorses that he is maintaining a stable weight.  ASSESSMENT & PLAN:  1.  Thrombocytopenia, mild and intermittent - Mild intermittent thrombocytopenia since February 2021 with platelets ranging from 101 to normal. - Workup of thrombocytopenia unremarkable: Normal nutritional panel (02/26/2022) with normal copper, iron, folate, methylmalonic acid, B12 Normal immature platelet fraction at 3.8% Normal SPEP (November 2022) Negative ANA and rheumatoid factor (November 2021) Negative  H. pylori IgG antibodies (testing done by [facility] December 2023) Negative for hepatitis B, hepatitis C. - US abdomen (06/27/2022): Negative for hepatosplenomegaly. - He denies any bleeding, bruising, or petechial rash. - CBC/D (06/27/2022): Platelets 123 - DIFFERENTIAL DIAGNOSIS includes ITP versus early signs of MDS - PLAN: Consider bone marrow biopsy if any worsening of his platelet count. - Monthly CBC/D with office visit in 3 months  2.  Neutropenia: - Chronic neutropenia since at least 2017 with baseline WBC 2.5-3.5 and ANC 1.3-1.5 - SPEP negative.  Normal folate, B12 and methylmalonic acid.  LDH normal. - He does not have any recurrent infections. - Most recent CBC (06/27/2022) with WBC 2.0/ANC 1.0, decreased from usual baseline. 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 - Monthly CBC/D with office visit in 3 months  3.  Iron deficiency without anemia  - EGD (11/12/2019): Normal stomach and duodenum.  No significant abnormalities in the esophagus. - Colonoscopy (08/18/2018): Colon polyp, otherwise unremarkable. - Taking daily iron supplementation (ferrous bisglycinate) since November 2022 - No hematemesis, hematochezia, or melena - Most recent labs (06/27/2022): Hgb 17.8/MCV 82.9, ferritin 27, iron saturation 19% - PLAN: Continue oral iron supplementation.  No need for IV iron at this time, but will consider IV iron if it is still low at follow-up - Repeat CBC/iron panel and RTC in 3 months.  4.  Erythrocytosis - He is noted to have mild intermittent erythrocytosis, likely related to OSA.  Also has some chronic elevations in RBCs with microcytosis that is attributable  to his sickle cell trait. - Most recent Hgb 17.8/hematocrit 52.2% (06/27/2022) - He uses CPAP for OSA, but reports that he "thinks the machine or mask is not working  right" - He uses topical testosterone supplements - No vasomotor symptoms or aquagenic pruritus - PLAN: Suspect secondary erythrocytosis from sleep apnea and poor compliance with CPAP as well as testosterone supplementation.   If any significant persistent erythrocytosis, would consider MPN workup.   5.  Sickle cell trait - Hemoglobin electrophoresis (02/02/2021) consistent with sickle cell trait (heterozygous) - Patient is asymptomatic, has never had any symptoms suggestive of acute pain crisis or venoocclusive events.   - Discussed with patient regarding sickle cell trait: Benign carrier condition that rarely results in significant clinical issues. In very rare cases patients can have symptoms of acute pain and venoocclusive disease. At risk for some blood sickling in cases of significant hypoxia (such as high-altitude and pressurized aircraft, very strenuous exercise with dehydration, etc.). Children and grandchildren would benefit from being screened for sickle cell trait to assist with future reproductive decisions. - PLAN: No indication for ongoing hematology follow-up of asymptomatic sickle cell trait.   6.  Social/family history: - He worked in the [employer] for 23 years and retired as a first Chief Strategy Officer.  No clear exposure to chemicals.  Quit smoking in 1979. - He has sleep apnea and has been using CPAP since 2006. - Son had HBs trait.  Mother is a Jehovah witness and also had colon cancer.  Maternal aunt died of ovarian cancer in her 53s.  PLAN SUMMARY: >> Monthly CBC/differential - [[follow trend of WBC and platelets and bring back sooner if any major changes]] >> Labs in 3 months = CBC/D, CMP, LDH, ferritin, iron/TIBC     REVIEW OF SYSTEMS:   Review of Systems  Constitutional:  Positive for fatigue. Negative for appetite change, chills, diaphoresis, fever and unexpected weight change.  HENT:   Negative for lump/mass and nosebleeds.   Eyes:  Negative for eye problems.   Respiratory:  Positive for shortness of breath. Negative for cough and hemoptysis.   Cardiovascular:  Positive for palpitations. Negative for chest pain and leg swelling.  Gastrointestinal:  Positive for constipation and nausea. Negative for abdominal pain, blood in stool, diarrhea and vomiting.  Genitourinary:  Negative for hematuria.   Musculoskeletal:  Positive for arthralgias.  Skin: Negative.   Neurological:  Positive for dizziness, headaches and numbness. Negative for light-headedness.  Hematological:  Does not bruise/bleed easily.     PHYSICAL EXAM:  ECOG PERFORMANCE STATUS: 1 - Symptomatic but completely ambulatory  There were no vitals filed for this visit. There were no vitals filed for this visit. Physical Exam Constitutional:      Appearance: Normal appearance. He is obese.  Cardiovascular:     Heart sounds: Normal heart sounds.  Pulmonary:     Breath sounds: Normal breath sounds.  Neurological:     General: No focal deficit present.     Mental Status: Mental status is at baseline.  Psychiatric:        Behavior: Behavior normal. Behavior is cooperative.    PAST MEDICAL/SURGICAL HISTORY:  Past Medical History:  Diagnosis Date   Arthritis    Asthma    GERD (gastroesophageal reflux disease)    Hearing loss    Hemorrhoids    Hyperlipidemia    Low testosterone in male    Peripheral neuropathy    Sleep apnea    wears cpap   Sore throat  Wears glasses    Wears hearing aid in both ears    Past Surgical History:  Procedure Laterality Date   ANTERIOR CERVICAL DECOMP/DISCECTOMY FUSION N/A 01/09/2017   Procedure: ANTERIOR CERVICAL DECOMPRESSION FUSION, CERVICAL FOUR-FIVE, CERVICAL FIVE-SIX INSTRUMENTATION AND ALLOGRAFT;  Surgeon: Estill Bamberg, MD;  Location: [facility] OR;  Service: Orthopedics;  Laterality: N/A;  ANTERIOR CERVICAL DECOMPRESSION FUSION, CERVICAL FOUR-FIVE, CERVICAL FIVE-SIX INSTRUMENTATION AND ALLOGRAFT   APPENDECTOMY     BIOPSY  11/12/2019    Procedure: BIOPSY;  Surgeon: Jeani Hawking, MD;  Location: [facility] ENDOSCOPY;  Service: Endoscopy;;   COLONOSCOPY     ESOPHAGOGASTRODUODENOSCOPY (EGD) WITH PROPOFOL N/A 11/12/2019   Procedure: ESOPHAGOGASTRODUODENOSCOPY (EGD) WITH PROPOFOL;  Surgeon: Jeani Hawking, MD;  Location: [facility] ENDOSCOPY;  Service: Endoscopy;  Laterality: N/A;   NECK SURGERY     SAVORY DILATION N/A 11/12/2019   Procedure: SAVORY DILATION;  Surgeon: Jeani Hawking, MD;  Location: [facility] ENDOSCOPY;  Service: Endoscopy;  Laterality: N/A;   SHOULDER SURGERY  2005   rotator cuff and tendons repaired    SOCIAL HISTORY:  Social History   Socioeconomic History   Marital status: Married    Spouse name: Not on file   Number of children: Not on file   Years of education: Not on file   Highest education level: Not on file  Occupational History   Not on file  Tobacco Use   Smoking status: Former    Current packs/day: 0.25    Average packs/day: 0.3 packs/day for 6.0 years (1.5 ttl pk-yrs)    Types: Cigarettes   Smokeless tobacco: Never  Vaping Use   Vaping status: Never Used  Substance and Sexual Activity   Alcohol use: Not Currently   Drug use: No   Sexual activity: Not on file  Other Topics Concern   Not on file  Social History Narrative   Not on file   Social Determinants of Health   Financial Resource Strain: Low Risk  (12/12/2021)   Overall Financial Resource Strain (CARDIA)    Difficulty of Paying Living Expenses: Not hard at all  Food Insecurity: No Food Insecurity (12/12/2021)   Hunger Vital Sign    Worried About Running Out of Food in the Last Year: Never true    Ran Out of Food in the Last Year: Never true  Transportation Needs: No Transportation Needs (12/12/2021)   PRAPARE - Administrator, Civil Service (Medical): No    Lack of Transportation (Non-Medical): No  Physical Activity: Sufficiently Active (12/12/2021)   Exercise Vital Sign    Days of Exercise per Week: 5 days    Minutes of Exercise per  Session: 50 min  Stress: Stress Concern Present (12/12/2021)   [employer] of Occupational Health - Occupational Stress Questionnaire    Feeling of Stress : To some extent  Social Connections: Unknown (08/24/2021)   [facility] from [facility]   Social Network    Social Network: Not on file  Intimate Partner Violence: Not At Risk (09/16/2022)   Received from [facility], [facility]   HITS    Over the last 12 months how often did your partner physically hurt you?: 1    Over the last 12 months how often did your partner insult you or talk down to you?: 1    Over the last 12 months how often did your partner threaten you with physical harm?: 1    Over the last 12 months how often did your partner scream or curse at  you?: 1    FAMILY HISTORY:  Family History  Problem Relation Age of Onset   Diabetes Mother    Hypertension Mother    Healthy Father    Cancer Other     CURRENT MEDICATIONS:  Outpatient Encounter Medications as of 10/09/2022  Medication Sig   albuterol (VENTOLIN HFA) 108 (90 Base) MCG/ACT inhaler USE 1 TO 2 INHALATIONS EVERY 6 HOURS AS NEEDED FOR WHEEZING OR SHORTNESS OF BREATH (Patient taking differently: Inhale 2 puffs into the lungs every 6 (six) hours as needed for wheezing or shortness of breath.)   amLODipine (NORVASC) 10 MG tablet Take 10 mg by mouth daily.   aspirin EC 81 MG tablet Take 81 mg by mouth daily.   atorvastatin (LIPITOR) 10 MG tablet TAKE 1 TABLET DAILY (Patient taking differently: Take 10 mg by mouth daily.)   benzonatate (TESSALON) 100 MG capsule Take 1 capsule (100 mg total) by mouth 2 (two) times daily as needed for cough.   Blood Glucose Monitoring Suppl KIT Use as directed to check blood sugars 1 time per day dx:e11.22   Buprenorphine HCl 450 MCG FILM Take 300 mcg by mouth every 12 (twelve) hours.    Buprenorphine HCl 450 MCG FILM PLACE ONE FILM TO CHEEK AND GUM UNTIL DISSOLVED EVERY 12 HOURS   EPINEPHrine 0.3 mg/0.3 mL IJ  SOAJ injection Inject 0.3 mLs (0.3 mg total) into the muscle as needed for anaphylaxis. (Patient taking differently: Inject 0.3 mg into the muscle daily as needed for anaphylaxis.)   fluticasone (FLONASE) 50 MCG/ACT nasal spray Place 2 sprays into both nostrils daily.   gabapentin (NEURONTIN) 300 MG capsule Take 300 mg by mouth 4 (four) times daily.   glucose blood test strip Use as directed to check blood sugars 1 time per day dx:e11.22   hydrocortisone 2.5 % cream SMARTSIG:1 Topical Daily   Lancets 33G MISC Use as directed to check blood sugars 1 time per day dx:e11.22   lansoprazole (PREVACID) 30 MG capsule Take 30 mg by mouth daily.    levalbuterol (XOPENEX HFA) 45 MCG/ACT inhaler Inhale into the lungs.   LINZESS 145 MCG CAPS capsule Take 145 mcg by mouth daily as needed (constipation).    loratadine (CLARITIN) 10 MG tablet Take 10 mg by mouth daily.   magnesium oxide (MAG-OX) 400 MG tablet Take 400 mg by mouth daily.   meloxicam (MOBIC) 15 MG tablet Take by mouth.   MI-ACID GAS RELIEF 80 MG chewable tablet    montelukast (SINGULAIR) 10 MG tablet TAKE 1 TABLET DAILY (Patient taking differently: Take 10 mg by mouth daily.)   neomycin-polymyxin-hydrocortisone (CORTISPORIN) 3.5-10000-1 OTIC suspension Place 4 drops into both ears 4 (four) times daily. X 7 days   sertraline (ZOLOFT) 100 MG tablet Take 100 mg by mouth daily.   tadalafil (CIALIS) 5 MG tablet Take 5 mg by mouth daily.    tamsulosin (FLOMAX) 0.4 MG CAPS capsule Take 0.4 mg by mouth at bedtime.   temazepam (RESTORIL) 15 MG capsule Take by mouth.   Testosterone 30 MG/ACT SOLN Apply 30 mg topically daily. Apply under each arm   tiZANidine (ZANAFLEX) 4 MG tablet Take 4 mg by mouth 3 (three) times daily.   urea (CARMOL) 20 % cream Apply 1 application topically daily as needed (Feet pain).    No facility-administered encounter medications on file as of 10/09/2022.    ALLERGIES:  No Known Allergies  LABORATORY DATA:  I have  reviewed the labs as listed.  CBC  Component Value Date/Time   WBC 3.6 (L) 10/03/2022 1334   RBC 6.28 (H) 10/03/2022 1334   HGB 17.5 (H) 10/03/2022 1334   HGB 17.4 12/12/2021 1545   HCT 52.4 (H) 10/03/2022 1334   HCT 51.4 (H) 12/12/2021 1545   PLT 117 (L) 10/03/2022 1334   PLT 110 (L) 12/12/2021 1545   MCV 83.4 10/03/2022 1334   MCV 83 12/12/2021 1545   MCH 27.9 10/03/2022 1334   MCHC 33.4 10/03/2022 1334   RDW 14.6 10/03/2022 1334   RDW 13.9 12/12/2021 1545   LYMPHSABS 1.0 10/03/2022 1334   LYMPHSABS 1.0 11/23/2020 1512   MONOABS 0.4 10/03/2022 1334   EOSABS 0.1 10/03/2022 1334   EOSABS 0.3 11/23/2020 1512   BASOSABS 0.0 10/03/2022 1334   BASOSABS 0.0 11/23/2020 1512      Latest Ref Rng & Units 10/03/2022    1:34 PM 06/27/2022    1:15 PM 06/13/2022   12:20 PM  CMP  Glucose 70 - 99 mg/dL 161  096  87   BUN 8 - 23 mg/dL 13  11  10    Creatinine 0.61 - 1.24 mg/dL 0.45  4.09  8.11   Sodium 135 - 145 mmol/L 136  134  139   Potassium 3.5 - 5.1 mmol/L 3.3  3.6  3.7   Chloride 98 - 111 mmol/L 101  101  99   CO2 22 - 32 mmol/L 26  26  23    Calcium 8.9 - 10.3 mg/dL 8.9  8.4  9.2   Total Protein 6.5 - 8.1 g/dL 6.8  6.8  6.9   Total Bilirubin 0.3 - 1.2 mg/dL 0.9  0.7  0.9   Alkaline Phos 38 - 126 U/L 70  61  78   AST 15 - 41 U/L 21  23  17    ALT 0 - 44 U/L 33  25  27     DIAGNOSTIC IMAGING:  I have independently reviewed the relevant imaging and discussed with the patient.   WRAP UP:  All questions were answered. The patient knows to call the clinic with any problems, questions or concerns.  Medical decision making: Moderate  Time spent on visit: I spent 25 minutes counseling the patient face to face. The total time spent in the appointment was 40 minutes and more than 50% was on counseling.  Carnella Guadalajara, PA-C  07/04/2022 6:38 PM

## 2022-10-09 ENCOUNTER — Inpatient Hospital Stay (HOSPITAL_BASED_OUTPATIENT_CLINIC_OR_DEPARTMENT_OTHER): Payer: No Typology Code available for payment source | Admitting: Physician Assistant

## 2022-10-09 VITALS — BP 139/88 | HR 79 | Temp 98.6°F | Resp 18 | Ht 68.0 in | Wt 202.4 lb

## 2022-10-09 DIAGNOSIS — D751 Secondary polycythemia: Secondary | ICD-10-CM | POA: Diagnosis not present

## 2022-10-09 DIAGNOSIS — Z87891 Personal history of nicotine dependence: Secondary | ICD-10-CM

## 2022-10-09 DIAGNOSIS — Z8041 Family history of malignant neoplasm of ovary: Secondary | ICD-10-CM

## 2022-10-09 DIAGNOSIS — D708 Other neutropenia: Secondary | ICD-10-CM

## 2022-10-09 DIAGNOSIS — D573 Sickle-cell trait: Secondary | ICD-10-CM

## 2022-10-09 DIAGNOSIS — D696 Thrombocytopenia, unspecified: Secondary | ICD-10-CM

## 2022-10-09 DIAGNOSIS — D509 Iron deficiency anemia, unspecified: Secondary | ICD-10-CM

## 2022-10-09 DIAGNOSIS — E611 Iron deficiency: Secondary | ICD-10-CM | POA: Diagnosis not present

## 2022-10-09 DIAGNOSIS — R718 Other abnormality of red blood cells: Secondary | ICD-10-CM

## 2022-10-09 DIAGNOSIS — Z800 Family history of malignant neoplasm of digestive organs: Secondary | ICD-10-CM

## 2022-10-09 NOTE — Patient Instructions (Signed)
[facility] at [facility] **VISIT SUMMARY & IMPORTANT INSTRUCTIONS **   You were seen today by Rojelio Brenner PA-C for your follow-up visit.    LOW PLATELETS & LOW WHITE BLOOD CELLS You continue to have mildly low platelets and low white blood cells. Your tests did NOT show any signs of blood or bone marrow cancer. Your low white blood cells are likely a normal variant and nothing to be concerned about. Your low platelets are possibly due to immune system dysfunction, but do not need treatment at this time. We will see you for follow-up visit in 3 months  IRON DEFICIENCY Your iron levels remain slightly low.  Continue to take iron tablet every day.  ELEVATED RED BLOOD CELLS Your red blood cells and hemoglobin are elevated due to your sleep apnea and your testosterone supplement. Your elevated blood cells put you at increased risk of blood clots, heart, attack and stroke. Take aspirin 81 mg daily. Donate blood once every 3 months  FOLLOW-UP APPOINTMENT: Follow-up visit in 3 months  ** Thank you for trusting me with your healthcare!  I strive to provide all of my patients with quality care at each visit.  If you receive a survey for this visit, I would be so grateful to you for taking the time to provide feedback.  Thank you in advance!  ~ Mercie Balsley                   Dr. Doreatha Massed   &   Rojelio Brenner, PA-C   - - - - - - - - - - - - - - - - - -    Thank you for choosing [facility] at [facility] to provide your oncology and hematology care.  To afford each patient quality time with our provider, please arrive at least 15 minutes before your scheduled appointment time.   If you have a lab appointment with the Cancer Center please come in thru the Main Entrance and check in at the main information desk.  You need to re-schedule your appointment should you arrive 10 or more minutes late.  We strive to give you quality time  with our providers, and arriving late affects you and other patients whose appointments are after yours.  Also, if you no show three or more times for appointments you may be dismissed from the clinic at the providers discretion.     Again, thank you for choosing [facility].  Our hope is that these requests will decrease the amount of time that you wait before being seen by our physicians.       _____________________________________________________________  Should you have questions after your visit to [facility], please contact our office at [phone] and follow the prompts.  Our office hours are 8:00 a.m. and 4:30 p.m. Monday - Friday.  Please note that voicemails left after 4:00 p.m. may not be returned until the following business day.  We are closed weekends and major holidays.  You do have access to a nurse 24-7, just call the main number to the clinic [phone] and do not press any options, hold on the line and a nurse will answer the phone.    For prescription refill requests, have your pharmacy contact our office and allow 72 hours.

## 2022-11-22 ENCOUNTER — Other Ambulatory Visit (HOSPITAL_COMMUNITY): Payer: Self-pay | Admitting: Physician Assistant

## 2022-11-22 DIAGNOSIS — M545 Low back pain, unspecified: Secondary | ICD-10-CM

## 2022-12-04 ENCOUNTER — Ambulatory Visit (HOSPITAL_COMMUNITY)
Admission: RE | Admit: 2022-12-04 | Discharge: 2022-12-04 | Disposition: A | Discharge status: Home / Self Care | Payer: No Typology Code available for payment source | Source: Ambulatory Visit | Attending: Physician Assistant | Admitting: Physician Assistant

## 2022-12-04 DIAGNOSIS — M545 Low back pain, unspecified: Secondary | ICD-10-CM | POA: Insufficient documentation

## 2023-01-01 ENCOUNTER — Ambulatory Visit: Payer: Medicare Other

## 2023-01-01 ENCOUNTER — Ambulatory Visit (INDEPENDENT_AMBULATORY_CARE_PROVIDER_SITE_OTHER): Payer: Medicare Other | Admitting: Internal Medicine

## 2023-01-01 ENCOUNTER — Encounter: Payer: Self-pay | Admitting: Internal Medicine

## 2023-01-01 VITALS — BP 128/62 | HR 81 | Temp 98.4°F | Ht 67.6 in | Wt 201.0 lb

## 2023-01-01 DIAGNOSIS — Z0000 Encounter for general adult medical examination without abnormal findings: Secondary | ICD-10-CM | POA: Diagnosis not present

## 2023-01-01 DIAGNOSIS — E876 Hypokalemia: Secondary | ICD-10-CM

## 2023-01-01 DIAGNOSIS — D573 Sickle-cell trait: Secondary | ICD-10-CM | POA: Diagnosis not present

## 2023-01-01 DIAGNOSIS — Z6830 Body mass index (BMI) 30.0-30.9, adult: Secondary | ICD-10-CM

## 2023-01-01 DIAGNOSIS — E6609 Other obesity due to excess calories: Secondary | ICD-10-CM

## 2023-01-01 DIAGNOSIS — R7309 Other abnormal glucose: Secondary | ICD-10-CM | POA: Insufficient documentation

## 2023-01-01 DIAGNOSIS — Z23 Encounter for immunization: Secondary | ICD-10-CM | POA: Diagnosis not present

## 2023-01-01 DIAGNOSIS — E7800 Pure hypercholesterolemia, unspecified: Secondary | ICD-10-CM

## 2023-01-01 DIAGNOSIS — I10 Essential (primary) hypertension: Secondary | ICD-10-CM | POA: Insufficient documentation

## 2023-01-01 DIAGNOSIS — E66811 Obesity, class 1: Secondary | ICD-10-CM

## 2023-01-01 MED ORDER — EPINEPHRINE 0.3 MG/0.3ML IJ SOAJ: 0.3000 mg | INTRAMUSCULAR | 2 refills | Status: AC | PRN

## 2023-01-01 NOTE — Assessment & Plan Note (Signed)
Chronic, fair control. Goal BP<120/80.  He will continue with amlodipine 10mg  He is encouraged to follow low sodium diet.

## 2023-01-01 NOTE — Patient Instructions (Signed)
Cholesterol Content in Foods Cholesterol is a waxy, fat-like substance that helps to carry fat in the blood. The body needs cholesterol in small amounts, but too much cholesterol can cause damage to the arteries and heart. What foods have cholesterol?  Cholesterol is found in animal-based foods, such as meat, seafood, and dairy. Generally, low-fat dairy and lean meats have less cholesterol than full-fat dairy and fatty meats. The milligrams of cholesterol per serving (mg per serving) of common cholesterol-containing foods are listed below. Meats and other proteins Egg -- one large whole egg has 186 mg. Veal shank -- 4 oz (113 g) has 141 mg. Lean ground turkey (93% lean) -- 4 oz (113 g) has 118 mg. Fat-trimmed lamb loin -- 4 oz (113 g) has 106 mg. Lean ground beef (90% lean) -- 4 oz (113 g) has 100 mg. Lobster -- 3.5 oz (99 g) has 90 mg. Pork loin chops -- 4 oz (113 g) has 86 mg. Canned salmon -- 3.5 oz (99 g) has 83 mg. Fat-trimmed beef top loin -- 4 oz (113 g) has 78 mg. Frankfurter -- 1 frank (3.5 oz or 99 g) has 77 mg. Crab -- 3.5 oz (99 g) has 71 mg. Roasted chicken without skin, white meat -- 4 oz (113 g) has 66 mg. Light bologna -- 2 oz (57 g) has 45 mg. Deli-cut turkey -- 2 oz (57 g) has 31 mg. Canned tuna -- 3.5 oz (99 g) has 31 mg. Bacon -- 1 oz (28 g) has 29 mg. Oysters and mussels (raw) -- 3.5 oz (99 g) has 25 mg. Mackerel -- 1 oz (28 g) has 22 mg. Trout -- 1 oz (28 g) has 20 mg. Pork sausage -- 1 link (1 oz or 28 g) has 17 mg. Salmon -- 1 oz (28 g) has 16 mg. Tilapia -- 1 oz (28 g) has 14 mg. Dairy Soft-serve ice cream --  cup (4 oz or 86 g) has 103 mg. Whole-milk yogurt -- 1 cup (8 oz or 245 g) has 29 mg. Cheddar cheese -- 1 oz (28 g) has 28 mg. American cheese -- 1 oz (28 g) has 28 mg. Whole milk -- 1 cup (8 oz or 250 mL) has 23 mg. 2% milk -- 1 cup (8 oz or 250 mL) has 18 mg. Cream cheese -- 1 tablespoon (Tbsp) (14.5 g) has 15 mg. Cottage cheese --  cup (4 oz or  113 g) has 14 mg. Low-fat (1%) milk -- 1 cup (8 oz or 250 mL) has 10 mg. Sour cream -- 1 Tbsp (12 g) has 8.5 mg. Low-fat yogurt -- 1 cup (8 oz or 245 g) has 8 mg. Nonfat Greek yogurt -- 1 cup (8 oz or 228 g) has 7 mg. Half-and-half cream -- 1 Tbsp (15 mL) has 5 mg. Fats and oils Cod liver oil -- 1 tablespoon (Tbsp) (13.6 g) has 82 mg. Butter -- 1 Tbsp (14 g) has 15 mg. Lard -- 1 Tbsp (12.8 g) has 14 mg. Bacon grease -- 1 Tbsp (12.9 g) has 14 mg. Mayonnaise -- 1 Tbsp (13.8 g) has 5-10 mg. Margarine -- 1 Tbsp (14 g) has 3-10 mg. The items listed above may not be a complete list of foods with cholesterol. Exact amounts of cholesterol in these foods may vary depending on specific ingredients and brands. Contact a dietitian for more information. What foods do not have cholesterol? Most plant-based foods do not have cholesterol unless you combine them with a food that has   cholesterol. Foods without cholesterol include: Grains and cereals. Vegetables. Fruits. Vegetable oils, such as olive, canola, and sunflower oil. Legumes, such as peas, beans, and lentils. Nuts and seeds. Egg whites. The items listed above may not be a complete list of foods that do not have cholesterol. Contact a dietitian for more information. Summary The body needs cholesterol in small amounts, but too much cholesterol can cause damage to the arteries and heart. Cholesterol is found in animal-based foods, such as meat, seafood, and dairy. Generally, low-fat dairy and lean meats have less cholesterol than full-fat dairy and fatty meats. This information is not intended to replace advice given to you by your health care provider. Make sure you discuss any questions you have with your health care provider. Document Revised: 07/21/2020 Document Reviewed: 07/21/2020 Elsevier Patient Education  2024 Elsevier Inc.  

## 2023-01-01 NOTE — Assessment & Plan Note (Signed)
He is encouraged to stay well hydrated. Currently asymptomatic.

## 2023-01-01 NOTE — Assessment & Plan Note (Signed)
BMI is acceptable for his demographic. He is encouraged to aim for at least 150 minutes of exercise/week.

## 2023-01-01 NOTE — Assessment & Plan Note (Addendum)
He is currently atorvastatin 10mg  daily. We discussed the use of cardiac calcium scoring. He is aware of $99 fee.

## 2023-01-01 NOTE — Progress Notes (Signed)
Subjective:   Andrew Fisher is a 67 y.o. male who presents for Medicare Annual/Subsequent preventive examination.  Visit Complete: In person    Cardiac Risk Factors include: advanced age (>25men, >31 women);male gender     Objective:    Today's Vitals   01/01/23 1407  BP: 128/62  Pulse: 81  Temp: 98.4 F (36.9 C)  TempSrc: Oral  SpO2: 95%  Weight: 201 lb (91.2 kg)  Height: 5' 7.6" (1.717 m)   Body mass index is 30.92 kg/m.     01/01/2023    2:24 PM 10/09/2022    1:13 PM 07/04/2022    1:54 PM 02/26/2022   10:17 AM 12/12/2021    4:04 PM 11/15/2021   10:39 AM 05/17/2021   10:12 AM  Advanced Directives  Does Patient Have a Medical Advance Directive? No No No No No No No  Would patient like information on creating a medical advance directive? No - Patient declined No - Patient declined No - Patient declined No - Patient declined No - Patient declined No - Patient declined No - Patient declined    Current Medications (verified) Outpatient Encounter Medications as of 01/01/2023  Medication Sig   acetaminophen (TYLENOL) 500 MG tablet Take by mouth.   albuterol (VENTOLIN HFA) 108 (90 Base) MCG/ACT inhaler USE 1 TO 2 INHALATIONS EVERY 6 HOURS AS NEEDED FOR WHEEZING OR SHORTNESS OF BREATH (Patient taking differently: Inhale 2 puffs into the lungs every 6 (six) hours as needed for wheezing or shortness of breath.)   amLODipine (NORVASC) 10 MG tablet Take 10 mg by mouth daily.   aspirin EC 81 MG tablet Take 81 mg by mouth daily.   atorvastatin (LIPITOR) 10 MG tablet TAKE 1 TABLET DAILY (Patient taking differently: Take 10 mg by mouth daily.)   benzonatate (TESSALON) 100 MG capsule Take 1 capsule (100 mg total) by mouth 2 (two) times daily as needed for cough.   Blood Glucose Monitoring Suppl KIT Use as directed to check blood sugars 1 time per day dx:e11.22   Buprenorphine HCl 450 MCG FILM Take 300 mcg by mouth every 12 (twelve) hours.    Buprenorphine HCl 450 MCG FILM PLACE  ONE FILM TO CHEEK AND GUM UNTIL DISSOLVED EVERY 12 HOURS   Carboxymethylcellulose Sod PF (REFRESH CELLUVISC) 1 % GEL Apply 1 drop to eye at bedtime.   Carboxymethylcellulose Sodium (REFRESH TEARS OP) Apply 1 drop to eye in the morning, at noon, in the evening, and at bedtime.   EPINEPHrine 0.3 mg/0.3 mL IJ SOAJ injection Inject 0.3 mLs (0.3 mg total) into the muscle as needed for anaphylaxis. (Patient taking differently: Inject 0.3 mg into the muscle daily as needed for anaphylaxis.)   fluticasone (FLONASE) 50 MCG/ACT nasal spray Place 2 sprays into both nostrils daily.   gabapentin (NEURONTIN) 300 MG capsule Take 300 mg by mouth 4 (four) times daily.   glucose blood test strip Use as directed to check blood sugars 1 time per day dx:e11.22   hydrocortisone 2.5 % cream SMARTSIG:1 Topical Daily   ibuprofen (ADVIL) 800 MG tablet Take by mouth.   IRON GLYCINATE PO Take 1 tablet by mouth daily.   Lancets 33G MISC Use as directed to check blood sugars 1 time per day dx:e11.22   lansoprazole (PREVACID) 30 MG capsule Take 30 mg by mouth daily.    levalbuterol (XOPENEX HFA) 45 MCG/ACT inhaler Inhale into the lungs.   LINZESS 145 MCG CAPS capsule Take 145 mcg by mouth daily as needed (constipation).  loratadine (CLARITIN) 10 MG tablet Take 10 mg by mouth daily.   magnesium oxide (MAG-OX) 400 MG tablet Take 400 mg by mouth daily.   meloxicam (MOBIC) 15 MG tablet Take by mouth.   MI-ACID GAS RELIEF 80 MG chewable tablet    montelukast (SINGULAIR) 10 MG tablet TAKE 1 TABLET DAILY (Patient taking differently: Take 10 mg by mouth daily.)   neomycin-polymyxin-hydrocortisone (CORTISPORIN) 3.5-10000-1 OTIC suspension Place 4 drops into both ears 4 (four) times daily. X 7 days   sertraline (ZOLOFT) 100 MG tablet Take 100 mg by mouth daily.   tadalafil (CIALIS) 5 MG tablet Take 5 mg by mouth daily.    tamsulosin (FLOMAX) 0.4 MG CAPS capsule Take 0.4 mg by mouth at bedtime.   temazepam (RESTORIL) 15 MG capsule  Take by mouth.   Testosterone 30 MG/ACT SOLN Apply 30 mg topically daily. Apply under each arm   tiZANidine (ZANAFLEX) 4 MG tablet Take 4 mg by mouth 3 (three) times daily.   urea (CARMOL) 20 % cream Apply 1 application topically daily as needed (Feet pain).    No facility-administered encounter medications on file as of 01/01/2023.    Allergies (verified) Patient has no known allergies.   History: Past Medical History:  Diagnosis Date   Arthritis    Asthma    GERD (gastroesophageal reflux disease)    Hearing loss    Hemorrhoids    Hyperlipidemia    Low testosterone in male    Peripheral neuropathy    Sleep apnea    wears cpap   Sore throat    Wears glasses    Wears hearing aid in both ears    Past Surgical History:  Procedure Laterality Date   ANTERIOR CERVICAL DECOMP/DISCECTOMY FUSION N/A 01/09/2017   Procedure: ANTERIOR CERVICAL DECOMPRESSION FUSION, CERVICAL FOUR-FIVE, CERVICAL FIVE-SIX INSTRUMENTATION AND ALLOGRAFT;  Surgeon: Estill Bamberg, MD;  Location: [facility] OR;  Service: Orthopedics;  Laterality: N/A;  ANTERIOR CERVICAL DECOMPRESSION FUSION, CERVICAL FOUR-FIVE, CERVICAL FIVE-SIX INSTRUMENTATION AND ALLOGRAFT   APPENDECTOMY     BIOPSY  11/12/2019   Procedure: BIOPSY;  Surgeon: Jeani Hawking, MD;  Location: [facility];  Service: Endoscopy;;   CARPAL TUNNEL RELEASE Left    06/2022   CARPAL TUNNEL RELEASE Right    10/2022   COLONOSCOPY     ESOPHAGOGASTRODUODENOSCOPY (EGD) WITH PROPOFOL N/A 11/12/2019   Procedure: ESOPHAGOGASTRODUODENOSCOPY (EGD) WITH PROPOFOL;  Surgeon: Jeani Hawking, MD;  Location: [facility];  Service: Endoscopy;  Laterality: N/A;   NECK SURGERY     SAVORY DILATION N/A 11/12/2019   Procedure: SAVORY DILATION;  Surgeon: Jeani Hawking, MD;  Location: [facility];  Service: Endoscopy;  Laterality: N/A;   SHOULDER SURGERY  2005   rotator cuff and tendons repaired   Family History  Problem Relation Age of Onset   Diabetes Mother    Hypertension  Mother    Healthy Father    Cancer Other    Social History   Socioeconomic History   Marital status: Married    Spouse name: Not on file   Number of children: Not on file   Years of education: Not on file   Highest education level: Not on file  Occupational History   Not on file  Tobacco Use   Smoking status: Former    Current packs/day: 0.25    Average packs/day: 0.3 packs/day for 6.0 years (1.5 ttl pk-yrs)    Types: Cigarettes   Smokeless tobacco: Never  Vaping Use   Vaping status: Never Used  Substance  and Sexual Activity   Alcohol use: Not Currently   Drug use: No   Sexual activity: Not on file  Other Topics Concern   Not on file  Social History Narrative   Not on file   Social Determinants of Health   Financial Resource Strain: Low Risk  (01/01/2023)   Overall Financial Resource Strain (CARDIA)    Difficulty of Paying Living Expenses: Not hard at all  Food Insecurity: No Food Insecurity (01/01/2023)   Hunger Vital Sign    Worried About Running Out of Food in the Last Year: Never true    Ran Out of Food in the Last Year: Never true  Transportation Needs: No Transportation Needs (01/01/2023)   PRAPARE - Administrator, Civil Service (Medical): No    Lack of Transportation (Non-Medical): No  Physical Activity: Insufficiently Active (01/01/2023)   Exercise Vital Sign    Days of Exercise per Week: 4 days    Minutes of Exercise per Session: 20 min  Stress: Stress Concern Present (01/01/2023)   [employer] of Occupational Health - Occupational Stress Questionnaire    Feeling of Stress : To some extent  Social Connections: Moderately Integrated (01/01/2023)   Social Connection and Isolation Panel [NHANES]    Frequency of Communication with Friends and Family: Three times a week    Frequency of Social Gatherings with Friends and Family: Once a week    Attends Religious Services: More than 4 times per year    Active Member of [employer] or Organizations: No     Attends Engineer, structural: Never    Marital Status: Married    Tobacco Counseling Counseling given: Not Answered   Clinical Intake:  Pre-visit preparation completed: Yes  Pain : No/denies pain     Nutritional Status: BMI > 30  Obese Nutritional Risks: Nausea/ vomitting/ diarrhea (nausea couple days ago) Diabetes: No  How often do you need to have someone help you when you read instructions, pamphlets, or other written materials from your doctor or pharmacy?: 1 - Never  Interpreter Needed?: No  Information entered by :: NAllen LPN   Activities of Daily Living    01/01/2023    2:08 PM  In your present state of health, do you have any difficulty performing the following activities:  Hearing? 1  Comment has hearing aids  Vision? 1  Comment double vision being taken care of  Difficulty concentrating or making decisions? 0  Walking or climbing stairs? 0  Dressing or bathing? 0  Doing errands, shopping? 0  Preparing Food and eating ? N  Using the Toilet? N  In the past six months, have you accidently leaked urine? N  Do you have problems with loss of bowel control? N  Managing your Medications? N  Managing your Finances? N  Housekeeping or managing your Housekeeping? N    Patient Care Team: Dorothyann Peng, MD as PCP - General (Internal Medicine) Doreatha Massed, MD as Medical Oncologist (Hematology)  Indicate any recent Medical Services you may have received from other than [facility] providers in the past year (date may be approximate).     Assessment:   This is a routine wellness examination for [place].  Hearing/Vision screen Hearing Screening - Comments:: Has hearing aids that are maintained Vision Screening - Comments:: [facility]   Goals Addressed             This Visit's Progress    Patient Stated       01/01/2023,  wants to lose weight and get through surgeries       Depression Screen    01/01/2023    2:27 PM 06/13/2022    11:35 AM 12/12/2021    4:05 PM 12/12/2021    2:17 PM 11/23/2020    2:23 PM 05/19/2019   11:33 AM 05/04/2018    3:06 PM  PHQ 2/9 Scores  PHQ - 2 Score 0 0 0 0 0 0 0    Fall Risk    01/01/2023    2:26 PM 06/13/2022   11:36 AM 06/13/2022   11:35 AM 12/12/2021    4:05 PM 12/12/2021    2:17 PM  Fall Risk   Falls in the past year? 0 0 0 0 0  Number falls in past yr: 0 0 0 0 0  Injury with Fall? 0  0 0 0  Risk for fall due to : Medication side effect No Fall Risks No Fall Risks Medication side effect No Fall Risks  Follow up Falls prevention discussed;Falls evaluation completed Falls evaluation completed Falls evaluation completed Falls prevention discussed;Education provided;Falls evaluation completed Falls evaluation completed    MEDICARE RISK AT HOME: Medicare Risk at Home Any stairs in or around the home?: No If so, are there any without handrails?: No Home free of loose throw rugs in walkways, pet beds, electrical cords, etc?: Yes Adequate lighting in your home to reduce risk of falls?: Yes Life alert?: No Use of a cane, walker or w/c?: No Grab bars in the bathroom?: No Shower chair or bench in shower?: Yes Elevated toilet seat or a handicapped toilet?: No  TIMED UP AND GO:  Was the test performed?  Yes  Length of time to ambulate 10 feet: 5 sec Gait steady and fast without use of assistive device    Cognitive Function:        01/01/2023    2:27 PM 12/12/2021    4:07 PM 11/23/2020    2:26 PM 11/17/2019    9:55 AM 11/17/2019    9:54 AM  6CIT Screen  What Year? 0 points 0 points 0 points  0 points  What month? 0 points 0 points 0 points  0 points  What time? 0 points 0 points 0 points 0 points   Count back from 20 0 points 0 points 0 points 0 points   Months in reverse 0 points 0 points 0 points 0 points   Repeat phrase 0 points 2 points 0 points 0 points   Total Score 0 points 2 points 0 points      Immunizations Immunization History  Administered Date(s) Administered    Fluad Quad(high Dose 65+) 12/13/2019, 11/23/2020, 12/12/2021   Influenza, High Dose Seasonal PF 12/13/2019   Influenza,inj,Quad PF,6+ Mos 11/16/2018   Influenza-Unspecified 03/25/2006, 11/28/2020   Moderna Covid-19 Fall Seasonal Vaccine 15yrs & older 01/01/2022   PFIZER(Purple Top)SARS-COV-2 Vaccination 05/21/2019, 06/12/2019   Pneumococcal Polysaccharide-23 01/13/2020   Tdap 12/13/2019   Zoster Recombinant(Shingrix) 01/13/2020, 07/31/2020    TDAP status: Up to date  Flu Vaccine status: Completed at today's visit  Pneumococcal vaccine status: Declined,  Education has been provided regarding the importance of this vaccine but patient still declined. Advised may receive this vaccine at local pharmacy or Health Dept. Aware to provide a copy of the vaccination record if obtained from local pharmacy or Health Dept. Verbalized acceptance and understanding.   Covid-19 vaccine status: Information provided on how to obtain vaccines.   Qualifies for Shingles Vaccine? Yes   Zostavax  completed Yes   Shingrix Completed?: Yes  Screening Tests Health Maintenance  Topic Date Due   Pneumonia Vaccine 20+ Years old (2 of 2 - PCV) 01/12/2021   INFLUENZA VACCINE  10/24/2022   COVID-19 Vaccine (5 - 2023-24 season) 11/24/2022   Medicare Annual Wellness (AWV)  01/01/2024   Colonoscopy  08/17/2028   DTaP/Tdap/Td (2 - Td or Tdap) 12/12/2029   Hepatitis C Screening  Completed   Zoster Vaccines- Shingrix  Completed   HPV VACCINES  Aged Out    Health Maintenance  Health Maintenance Due  Topic Date Due   Pneumonia Vaccine 25+ Years old (2 of 2 - PCV) 01/12/2021   INFLUENZA VACCINE  10/24/2022   COVID-19 Vaccine (5 - 2023-24 season) 11/24/2022    Colorectal cancer screening: Type of screening: Colonoscopy. Completed 08/18/2018. Repeat every 10 years  Lung Cancer Screening: (Low Dose CT Chest recommended if Age 20-80 years, 20 pack-year currently smoking OR have quit w/in 15years.) does not  qualify.   Lung Cancer Screening Referral: no  Additional Screening:  Hepatitis C Screening: does qualify; Completed 04/15/2012  Vision Screening: Recommended annual ophthalmology exams for early detection of glaucoma and other disorders of the eye. Is the patient up to date with their annual eye exam?  Yes  Who is the provider or what is the name of the office in which the patient attends annual eye exams? [facility] If pt is not established with a provider, would they like to be referred to a provider to establish care? No .   Dental Screening: Recommended annual dental exams for proper oral hygiene  Diabetic Foot Exam: n/a  Community Resource Referral / Chronic Care Management: CRR required this visit?  No   CCM required this visit?  No     Plan:     I have personally reviewed and noted the following in the patient's chart:   Medical and social history Use of alcohol, tobacco or illicit drugs  Current medications and supplements including opioid prescriptions. Patient is not currently taking opioid prescriptions. Functional ability and status Nutritional status Physical activity Advanced directives List of other physicians Hospitalizations, surgeries, and ER visits in previous 12 months Vitals Screenings to include cognitive, depression, and falls Referrals and appointments  In addition, I have reviewed and discussed with patient certain preventive protocols, quality metrics, and best practice recommendations. A written personalized care plan for preventive services as well as general preventive health recommendations were provided to patient.     Barb Merino, LPN   16/03/958   After Visit Summary: (In Person-Printed) AVS printed and given to the patient  Nurse Notes: none

## 2023-01-01 NOTE — Patient Instructions (Signed)
Andrew Fisher , Thank you for taking time to come for your Medicare Wellness Visit. I appreciate your ongoing commitment to your health goals. Please review the following plan we discussed and let me know if I can assist you in the future.   Referrals/Orders/Follow-Ups/Clinician Recommendations: none  This is a list of the screening recommended for you and due dates:  Health Maintenance  Topic Date Due   Pneumonia Vaccine (2 of 2 - PCV) 01/12/2021   Flu Shot  10/24/2022   COVID-19 Vaccine (5 - 2023-24 season) 11/24/2022   Medicare Annual Wellness Visit  01/01/2024   Colon Cancer Screening  08/17/2028   DTaP/Tdap/Td vaccine (2 - Td or Tdap) 12/12/2029   Hepatitis C Screening  Completed   Zoster (Shingles) Vaccine  Completed   HPV Vaccine  Aged Out    Advanced directives: (Declined) Advance directive discussed with you today. Even though you declined this today, please call our office should you change your mind, and we can give you the proper paperwork for you to fill out.  Next Medicare Annual Wellness Visit scheduled for next year: Yes  insert Preventive Care attachment Insert FALL PREVENTION attachment if needed

## 2023-01-01 NOTE — Progress Notes (Signed)
I,[name], CMA,acting as a Neurosurgeon for Andrew Aliment, MD.,have documented all relevant documentation on the behalf of Andrew Aliment, MD,as directed by  Andrew Aliment, MD while in the presence of Andrew Aliment, MD.  Subjective:  Patient ID: Andrew Fisher , male    DOB: December 12, 1955 , 67 y.o.   MRN: [id number]  Chief Complaint  Patient presents with   Hyperlipidemia   Hypertension    HPI  Pt presents today for bp & chol check.  He is also followed by the [facility] for his chronic conditions. Most of his specialists are now at the [place]. He denies headaches, chest pain and shortness of breath.   AWV completed with [facility] Advisor: [place].    Hyperlipidemia This is a chronic problem. The current episode started more than 1 year ago. The problem is controlled. He has no history of liver disease. Pertinent negatives include no shortness of breath. Current antihyperlipidemic treatment includes statins.  Hypertension This is a chronic problem. The current episode started more than 1 year ago. The problem has been gradually improving since onset. The problem is controlled. Pertinent negatives include no anxiety, neck pain, orthopnea or shortness of breath. Past treatments include calcium channel blockers. The current treatment provides moderate improvement.     Past Medical History:  Diagnosis Date   Arthritis    Asthma    GERD (gastroesophageal reflux disease)    Hearing loss    Hemorrhoids    Hyperlipidemia    Low testosterone in male    Peripheral neuropathy    Sleep apnea    wears cpap   Sore throat    Wears glasses    Wears hearing aid in both ears      Family History  Problem Relation Age of Onset   Diabetes Mother    Hypertension Mother    Healthy Father    Cancer Other      Current Outpatient Medications:    acetaminophen (TYLENOL) 500 MG tablet, Take by mouth., Disp: , Rfl:    albuterol (VENTOLIN HFA) 108 (90 Base) MCG/ACT inhaler, USE 1 TO 2 INHALATIONS  EVERY 6 HOURS AS NEEDED FOR WHEEZING OR SHORTNESS OF BREATH (Patient taking differently: Inhale 2 puffs into the lungs every 6 (six) hours as needed for wheezing or shortness of breath.), Disp: 17 g, Rfl: 4   amLODipine (NORVASC) 10 MG tablet, Take 10 mg by mouth daily., Disp: , Rfl:    aspirin EC 81 MG tablet, Take 81 mg by mouth daily., Disp: , Rfl:    atorvastatin (LIPITOR) 10 MG tablet, TAKE 1 TABLET DAILY (Patient taking differently: Take 10 mg by mouth daily.), Disp: 90 tablet, Rfl: 3   benzonatate (TESSALON) 100 MG capsule, Take 1 capsule (100 mg total) by mouth 2 (two) times daily as needed for cough., Disp: 20 capsule, Rfl: 0   Blood Glucose Monitoring Suppl KIT, Use as directed to check blood sugars 1 time per day dx:e11.22, Disp: 1 kit, Rfl: 1   Buprenorphine HCl 450 MCG FILM, Take 300 mcg by mouth every 12 (twelve) hours. , Disp: , Rfl:    Buprenorphine HCl 450 MCG FILM, PLACE ONE FILM TO CHEEK AND GUM UNTIL DISSOLVED EVERY 12 HOURS, Disp: , Rfl:    Carboxymethylcellulose Sod PF (REFRESH CELLUVISC) 1 % GEL, Apply 1 drop to eye at bedtime., Disp: , Rfl:    Carboxymethylcellulose Sodium (REFRESH TEARS OP), Apply 1 drop to eye in the morning, at noon, in the evening,  and at bedtime., Disp: , Rfl:    EPINEPHrine 0.3 mg/0.3 mL IJ SOAJ injection, Inject 0.3 mg into the muscle as needed for anaphylaxis., Disp: 1 each, Rfl: 2   fluticasone (FLONASE) 50 MCG/ACT nasal spray, Place 2 sprays into both nostrils daily., Disp: 16 g, Rfl: 0   gabapentin (NEURONTIN) 300 MG capsule, Take 300 mg by mouth 4 (four) times daily., Disp: , Rfl:    glucose blood test strip, Use as directed to check blood sugars 1 time per day dx:e11.22, Disp: 150 each, Rfl: 2   hydrocortisone 2.5 % cream, SMARTSIG:1 Topical Daily, Disp: , Rfl:    ibuprofen (ADVIL) 800 MG tablet, Take by mouth., Disp: , Rfl:    IRON GLYCINATE PO, Take 1 tablet by mouth daily., Disp: , Rfl:    Lancets 33G MISC, Use as directed to check blood  sugars 1 time per day dx:e11.22, Disp: 150 each, Rfl: 2   lansoprazole (PREVACID) 30 MG capsule, Take 30 mg by mouth daily. , Disp: , Rfl:    levalbuterol (XOPENEX HFA) 45 MCG/ACT inhaler, Inhale into the lungs., Disp: , Rfl:    LINZESS 145 MCG CAPS capsule, Take 145 mcg by mouth daily as needed (constipation). , Disp: , Rfl:    loratadine (CLARITIN) 10 MG tablet, Take 10 mg by mouth daily., Disp: , Rfl:    magnesium oxide (MAG-OX) 400 MG tablet, Take 400 mg by mouth daily., Disp: , Rfl:    meloxicam (MOBIC) 15 MG tablet, Take by mouth., Disp: , Rfl:    MI-ACID GAS RELIEF 80 MG chewable tablet, , Disp: , Rfl:    montelukast (SINGULAIR) 10 MG tablet, TAKE 1 TABLET DAILY (Patient taking differently: Take 10 mg by mouth daily.), Disp: 90 tablet, Rfl: 1   neomycin-polymyxin-hydrocortisone (CORTISPORIN) 3.5-10000-1 OTIC suspension, Place 4 drops into both ears 4 (four) times daily. X 7 days, Disp: 10 mL, Rfl: 0   sertraline (ZOLOFT) 100 MG tablet, Take 100 mg by mouth daily., Disp: , Rfl:    tadalafil (CIALIS) 5 MG tablet, Take 5 mg by mouth daily. , Disp: , Rfl:    tamsulosin (FLOMAX) 0.4 MG CAPS capsule, Take 0.4 mg by mouth at bedtime., Disp: , Rfl:    temazepam (RESTORIL) 15 MG capsule, Take by mouth., Disp: , Rfl:    Testosterone 30 MG/ACT SOLN, Apply 30 mg topically daily. Apply under each arm, Disp: , Rfl:    tiZANidine (ZANAFLEX) 4 MG tablet, Take 4 mg by mouth 3 (three) times daily., Disp: , Rfl:    urea (CARMOL) 20 % cream, Apply 1 application topically daily as needed (Feet pain). , Disp: , Rfl:    No Known Allergies   Review of Systems  Constitutional: Negative.   HENT: Negative.    Eyes: Negative.   Respiratory: Negative.  Negative for shortness of breath.   Cardiovascular:  Negative for orthopnea.  Gastrointestinal: Negative.   Musculoskeletal:  Negative for neck pain.  Skin: Negative.   Allergic/Immunologic: Negative.   Neurological: Negative.   Hematological: Negative.       Today's Vitals   01/01/23 1418  BP: 128/62  Pulse: 81  Temp: 98.4 F (36.9 C)  Weight: 201 lb (91.2 kg)  Height: 5' 7.6" (1.717 m)   Body mass index is 30.92 kg/m.  Wt Readings from Last 3 Encounters:  01/01/23 201 lb (91.2 kg)  01/01/23 201 lb (91.2 kg)  10/09/22 202 lb 6.4 oz (91.8 kg)     Objective:  Physical Exam Vitals  and nursing note reviewed.  Constitutional:      Appearance: Normal appearance.  HENT:     Head: Normocephalic and atraumatic.  Eyes:     Extraocular Movements: Extraocular movements intact.  Cardiovascular:     Rate and Rhythm: Normal rate and regular rhythm.     Heart sounds: Normal heart sounds.  Pulmonary:     Effort: Pulmonary effort is normal.     Breath sounds: Normal breath sounds.  Musculoskeletal:     Cervical back: Normal range of motion.  Skin:    General: Skin is warm.  Neurological:     General: No focal deficit present.     Mental Status: He is alert.  Psychiatric:        Mood and Affect: Mood normal.         Assessment And Plan:  Pure hypercholesterolemia Assessment & Plan: He is currently atorvastatin 10mg  daily. We discussed the use of cardiac calcium scoring. He is aware of $99 fee.   Orders: -     CT CARDIAC SCORING (SELF PAY ONLY); Future -     TSH  Essential hypertension, benign Assessment & Plan: Chronic, fair control. Goal BP<120/80.  He will continue with amlodipine 10mg  He is encouraged to follow low sodium diet.  Orders: -     BMP8+EGFR  Hypokalemia Assessment & Plan: Potassium level 3.3 at his last visit. I will recheck level today.   Orders: -     BMP8+EGFR  Sickle cell trait (HCC) Assessment & Plan: He is encouraged to stay well hydrated. Currently asymptomatic.    Class 1 obesity due to excess calories with serious comorbidity and body mass index (BMI) of 30.0 to 30.9 in adult Assessment & Plan: BMI is acceptable for his demographic. He is encouraged to aim for at least 150 minutes of  exercise/week.    Other orders -     EPINEPHrine; Inject 0.3 mg into the muscle as needed for anaphylaxis.  Dispense: 1 each; Refill: 2     Return in about 6 months (around 07/02/2023) for bpc.  Patient was given opportunity to ask questions. Patient verbalized understanding of the plan and was able to repeat key elements of the plan. All questions were answered to their satisfaction.    I, Andrew Aliment, MD, have reviewed all documentation for this visit. The documentation on 01/01/23 for the exam, diagnosis, procedures, and orders are all accurate and complete.   IF YOU HAVE BEEN REFERRED TO A SPECIALIST, IT MAY TAKE 1-2 WEEKS TO SCHEDULE/PROCESS THE REFERRAL. IF YOU HAVE NOT HEARD FROM US/SPECIALIST IN TWO WEEKS, PLEASE GIVE [place] A CALL AT [phone].   THE PATIENT IS ENCOURAGED TO PRACTICE SOCIAL DISTANCING DUE TO THE COVID-19 PANDEMIC.

## 2023-01-01 NOTE — Assessment & Plan Note (Signed)
Previous labs reviewed, her A1c has been elevated in the past. I will check an A1c today. Reminded to avoid refined sugars including sugary drinks/foods and processed meats including bacon, sausages and deli meats.  

## 2023-01-01 NOTE — Assessment & Plan Note (Signed)
Potassium level 3.3 at his last visit. I will recheck level today.

## 2023-01-02 LAB — BMP8+EGFR
BUN/Creatinine Ratio: 10 (ref 10–24)
BUN: 12 mg/dL (ref 8–27)
CO2: 24 mmol/L (ref 20–29)
Calcium: 9.1 mg/dL (ref 8.6–10.2)
Chloride: 102 mmol/L (ref 96–106)
Creatinine, Ser: 1.24 mg/dL (ref 0.76–1.27)
Glucose: 114 mg/dL — ABNORMAL HIGH (ref 70–99)
Potassium: 3.7 mmol/L (ref 3.5–5.2)
Sodium: 141 mmol/L (ref 134–144)
eGFR: 63 mL/min/{1.73_m2} (ref >59)

## 2023-01-02 LAB — TSH: TSH: 1.53 u[IU]/mL (ref 0.450–4.500)

## 2023-01-15 ENCOUNTER — Inpatient Hospital Stay: Payer: Medicare Other | Attending: Physician Assistant

## 2023-01-15 DIAGNOSIS — D708 Other neutropenia: Secondary | ICD-10-CM | POA: Diagnosis not present

## 2023-01-15 DIAGNOSIS — E611 Iron deficiency: Secondary | ICD-10-CM | POA: Diagnosis present

## 2023-01-15 DIAGNOSIS — D696 Thrombocytopenia, unspecified: Secondary | ICD-10-CM | POA: Insufficient documentation

## 2023-01-15 DIAGNOSIS — Z87891 Personal history of nicotine dependence: Secondary | ICD-10-CM | POA: Insufficient documentation

## 2023-01-15 DIAGNOSIS — R718 Other abnormality of red blood cells: Secondary | ICD-10-CM

## 2023-01-15 DIAGNOSIS — Z800 Family history of malignant neoplasm of digestive organs: Secondary | ICD-10-CM | POA: Diagnosis not present

## 2023-01-15 DIAGNOSIS — D573 Sickle-cell trait: Secondary | ICD-10-CM | POA: Insufficient documentation

## 2023-01-15 DIAGNOSIS — Z8041 Family history of malignant neoplasm of ovary: Secondary | ICD-10-CM | POA: Insufficient documentation

## 2023-01-15 DIAGNOSIS — Z9989 Dependence on other enabling machines and devices: Secondary | ICD-10-CM | POA: Insufficient documentation

## 2023-01-15 DIAGNOSIS — D751 Secondary polycythemia: Secondary | ICD-10-CM | POA: Diagnosis not present

## 2023-01-15 DIAGNOSIS — G4733 Obstructive sleep apnea (adult) (pediatric): Secondary | ICD-10-CM | POA: Insufficient documentation

## 2023-01-15 DIAGNOSIS — Z7982 Long term (current) use of aspirin: Secondary | ICD-10-CM | POA: Diagnosis not present

## 2023-01-15 DIAGNOSIS — D509 Iron deficiency anemia, unspecified: Secondary | ICD-10-CM

## 2023-01-15 DIAGNOSIS — Z67A1 Duffy null: Secondary | ICD-10-CM

## 2023-01-15 LAB — CBC WITH DIFFERENTIAL/PLATELET
Abs Immature Granulocytes: 0 10*3/uL (ref 0.00–0.07)
Basophils Absolute: 0 10*3/uL (ref 0.0–0.1)
Basophils Relative: 0 %
Eosinophils Absolute: 0.1 10*3/uL (ref 0.0–0.5)
Eosinophils Relative: 4 %
HCT: 48.4 % (ref 39.0–52.0)
Hemoglobin: 16.7 g/dL (ref 13.0–17.0)
Immature Granulocytes: 0 %
Lymphocytes Relative: 32 %
Lymphs Abs: 0.9 10*3/uL (ref 0.7–4.0)
MCH: 28.9 pg (ref 26.0–34.0)
MCHC: 34.5 g/dL (ref 30.0–36.0)
MCV: 83.9 fL (ref 80.0–100.0)
Monocytes Absolute: 0.2 10*3/uL (ref 0.1–1.0)
Monocytes Relative: 7 %
Neutro Abs: 1.6 10*3/uL — ABNORMAL LOW (ref 1.7–7.7)
Neutrophils Relative %: 57 %
Platelets: 125 10*3/uL — ABNORMAL LOW (ref 150–400)
RBC: 5.77 MIL/uL (ref 4.22–5.81)
RDW: 13.2 % (ref 11.5–15.5)
WBC: 2.8 10*3/uL — ABNORMAL LOW (ref 4.0–10.5)
nRBC: 0 % (ref 0.0–0.2)

## 2023-01-15 LAB — COMPREHENSIVE METABOLIC PANEL
ALT: 25 U/L (ref 0–44)
AST: 19 U/L (ref 15–41)
Albumin: 4 g/dL (ref 3.5–5.0)
Alkaline Phosphatase: 59 U/L (ref 38–126)
Anion gap: 7 (ref 5–15)
BUN: 11 mg/dL (ref 8–23)
CO2: 28 mmol/L (ref 22–32)
Calcium: 8.7 mg/dL — ABNORMAL LOW (ref 8.9–10.3)
Chloride: 101 mmol/L (ref 98–111)
Creatinine, Ser: 1 mg/dL (ref 0.61–1.24)
GFR, Estimated: >60 mL/min (ref >60)
Glucose, Bld: 103 mg/dL — ABNORMAL HIGH (ref 70–99)
Potassium: 3.2 mmol/L — ABNORMAL LOW (ref 3.5–5.1)
Sodium: 136 mmol/L (ref 135–145)
Total Bilirubin: 1.2 mg/dL (ref 0.3–1.2)
Total Protein: 6.9 g/dL (ref 6.5–8.1)

## 2023-01-15 LAB — IRON AND TIBC
Iron: 136 ug/dL (ref 45–182)
Saturation Ratios: 39 % (ref 17.9–39.5)
TIBC: 348 ug/dL (ref 250–450)
UIBC: 212 ug/dL

## 2023-01-15 LAB — FERRITIN: Ferritin: 88 ng/mL (ref 24–336)

## 2023-01-15 LAB — LACTATE DEHYDROGENASE: LDH: 138 U/L (ref 98–192)

## 2023-01-22 ENCOUNTER — Inpatient Hospital Stay: Payer: Medicare Other | Admitting: Physician Assistant

## 2023-01-22 VITALS — BP 137/82 | HR 65 | Temp 98.4°F | Resp 18 | Ht 68.0 in | Wt 202.0 lb

## 2023-01-22 DIAGNOSIS — D751 Secondary polycythemia: Secondary | ICD-10-CM | POA: Diagnosis not present

## 2023-01-22 DIAGNOSIS — G4733 Obstructive sleep apnea (adult) (pediatric): Secondary | ICD-10-CM

## 2023-01-22 DIAGNOSIS — Z87891 Personal history of nicotine dependence: Secondary | ICD-10-CM

## 2023-01-22 DIAGNOSIS — D696 Thrombocytopenia, unspecified: Secondary | ICD-10-CM | POA: Diagnosis not present

## 2023-01-22 DIAGNOSIS — D573 Sickle-cell trait: Secondary | ICD-10-CM

## 2023-01-22 DIAGNOSIS — Z67A1 Duffy null: Secondary | ICD-10-CM

## 2023-01-22 DIAGNOSIS — R718 Other abnormality of red blood cells: Secondary | ICD-10-CM

## 2023-01-22 DIAGNOSIS — D709 Neutropenia, unspecified: Secondary | ICD-10-CM

## 2023-01-22 DIAGNOSIS — E611 Iron deficiency: Secondary | ICD-10-CM

## 2023-01-22 DIAGNOSIS — D509 Iron deficiency anemia, unspecified: Secondary | ICD-10-CM

## 2023-01-22 NOTE — Progress Notes (Signed)
[facility] [address] [place], [place] [address]   CLINIC:  Medical Oncology/Hematology  PCP:  Dorothyann Peng, MD [address] [place] [address] [phone]    REASON FOR VISIT:  Follow-up for sickle cell trait, iron deficiency without anemia, mild thrombocytopenia   PRIOR THERAPY: None   CURRENT THERAPY: Ferrous bisglycinate  INTERVAL HISTORY:   Andrew Fisher 67 y.o. male returns for routine follow-up of iron deficiency, thrombocytopenia, and sickle cell trait.  He was last seen by Rojelio Brenner PA-C on 10/09/2022.  At today's visit, he reports experiencing ongoing fatigue, arthralgia, and neuropathic pain.   He continues to take iron tablet (ferrous bisglycinate) daily.  No rectal bleeding, melena, easy bruising, or petechial rash.  No B symptoms or frequent infections.  He reports intermittent shortness of breath from asthma.   Occasional chest pain when he lies down after eating, which he attributes to heartburn.  He has ongoing dizziness with position changes that he states feels similar to his previous bouts of vertigo.  He has occasional headaches.   No syncope.  He has given blood once in the past 3 months.  He continues to maintain excellent compliance with his CPAP, and reports it is working better now that he has a new mask.  He has 50% energy and 75% appetite. He endorses that he is maintaining a stable weight.  ASSESSMENT & PLAN:  1.  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 - Workup of thrombocytopenia unremarkable: Normal nutritional panel (02/26/2022) with normal copper, iron, folate, methylmalonic acid, B12 Normal immature platelet fraction at 3.8% Normal SPEP (November 2022) Negative ANA and rheumatoid factor (November 2021) Negative H. pylori IgG antibodies (testing done by [facility] December  2023) Negative for hepatitis B, hepatitis C. - US abdomen (06/27/2022): Negative for hepatosplenomegaly. - [facility] workup was unremarkable, and hematologist felt that thrombocytopenia was possibly due to ITP.  He was recommended to follow-up with local hematology every 3 months, and if stable transition to every 7-month follow-up. Bone marrow biopsy (08/14/2022): Normocellular marrow with no evidence of dysplasia or increase in blasts.  Myeloid hypoplasia and absent iron stores. NGS panel negative. 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 - CBC/D (01/15/2023): Platelets 125 - DIFFERENTIAL DIAGNOSIS favors chronic ITP  - PLAN:  CBC/D with office visit in 6 months - Per [facility] recommend platelet goal >50 for major surgery; plt>80 for epidural catheter; plt >100 for neurosurgery or ophthalmic surgery; plt > 30 for minor surgery; it is okay to transfuse platelets to reach higher goal for procedure/surgeries   2.  Neutropenia: - Chronic neutropenia since at least 2017 with baseline WBC 2.5-3.5 and ANC 1.3-1.5 - SPEP negative.  Normal folate, B12 and methylmalonic acid.  LDH normal. -  [facility] workup was unremarkable, and hematologist felt that neutropenia likely a normal variant, since patient had a normal bone marrow biopsy (08/14/2022), normal NGS, normal cytogenetics - He does not have any recurrent infections. 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 - CBC/D with office visit in 6 months  3.  Iron deficiency without anemia  - EGD (11/12/2019): Normal  stomach and duodenum.  No significant abnormalities in the esophagus. - Colonoscopy (08/18/2018): Colon polyp, otherwise  unremarkable. - Taking daily iron supplementation (ferrous bisglycinate) since November 2022 - Bone marrow biopsy ([facility] 08/14/2022) showed absent iron stores in bone marrow - No hematemesis, hematochezia, or melena - Most recent labs (01/15/2023): Ferritin 88, iron saturation 39% - PLAN: Continue oral iron supplementation with vitamin C/orange juice. - No need for IV iron at this time, but will consider IV iron if it is still low at follow-up - Repeat CBC/iron panel and RTC in 6 months.  4.  Erythrocytosis - He is noted to have mild intermittent erythrocytosis, likely related to OSA and topical testosterone supplements.  Also has some chronic elevations in RBCs with microcytosis that is attributable to his sickle cell trait. - [facility] workup with normal bone marrow biopsy (08/14/2022), normal cytogenetics, and normal NGS panel - He uses CPAP for OSA, but reports that he "thinks the machine or mask is not working right."  He was recently switched to BiPAP. - Most recent CBC/D (01/15/2023): Hgb 16.7/hematocrit 48.4% - He has fatigue, dizziness, and headaches.  No aquagenic pruritus. - He recalls that he used to donate blood every 3 months at the direction of his urologist.  He stopped doing this about a year ago and reports that he has felt worse since that time.  Restarted voluntary phlebotomy in July 2024. - PLAN: Suspect secondary erythrocytosis from sleep apnea and poor compliance with CPAP as well as testosterone supplementation. - Continue voluntary blood donation every 3 months. - Continue aspirin 81 mg daily in the setting of secondary erythrocytosis and other cardiac risk factors (hypertension, hyperlipidemia, diabetes)   5.  Sickle cell trait - Hemoglobin electrophoresis (02/02/2021) consistent with sickle cell trait (heterozygous) - Abdominal US 06/27/2022 with normal appearing kidneys, no masses. Patient remains asymptomatic.  - Patient is asymptomatic, has never had any  symptoms suggestive of acute pain crisis or venoocclusive events.   - Discussed with patient regarding sickle cell trait: Benign carrier condition that rarely results in significant clinical issues. In very rare cases patients can have symptoms of acute pain and venoocclusive disease. At risk for some blood sickling in cases of significant hypoxia (such as high-altitude and pressurized aircraft, very strenuous exercise with dehydration, etc.). Children and grandchildren would benefit from being screened for sickle cell trait to assist with future reproductive decisions. - PLAN: No indication for ongoing hematology follow-up of asymptomatic sickle cell trait.   6.  Social/family history: - He worked in the [employer] for 23 years and retired as a first Chief Strategy Officer.  No clear exposure to chemicals.  Quit smoking in 1979. - He has sleep apnea and has been using CPAP since 2006. - Son had HBs trait.  Mother is a Jehovah witness and also had colon cancer.  Maternal aunt died of ovarian cancer in her 38s.   PLAN SUMMARY: >> Labs in 6 months = CBC/D, CMP, LDH, ferritin, iron/TIBC >> OFFICE visit in 6 months (1 week after labs)     REVIEW OF SYSTEMS:   Review of Systems  Constitutional:  Positive for fatigue. Negative for appetite change, chills, diaphoresis, fever and unexpected weight change.  HENT:   Negative for lump/mass and nosebleeds.   Eyes:  Negative for eye problems.  Respiratory:  Positive for shortness of breath (at times, none today). Negative for cough and hemoptysis.   Cardiovascular:  Positive for chest pain (at times, none today). Negative for leg swelling  and palpitations.  Gastrointestinal:  Positive for nausea. Negative for abdominal pain, blood in stool, constipation, diarrhea and vomiting.  Genitourinary:  Negative for hematuria.   Musculoskeletal:  Positive for arthralgias and back pain.  Skin: Negative.   Neurological:  Positive for dizziness, headaches and numbness.  Negative for light-headedness.  Hematological:  Does not bruise/bleed easily.  Psychiatric/Behavioral:  Positive for depression and sleep disturbance. The patient is nervous/anxious.      PHYSICAL EXAM:  ECOG PERFORMANCE STATUS: 1 - Symptomatic but completely ambulatory  Vitals:   01/22/23 1353  BP: 137/82  Pulse: 65  Resp: 18  Temp: 98.4 F (36.9 C)  SpO2: 96%   Filed Weights   01/22/23 1353  Weight: 202 lb (91.6 kg)   Physical Exam Constitutional:      Appearance: Normal appearance. He is obese.  Cardiovascular:     Heart sounds: Normal heart sounds.  Pulmonary:     Breath sounds: Normal breath sounds.  Neurological:     General: No focal deficit present.     Mental Status: Mental status is at baseline.  Psychiatric:        Behavior: Behavior normal. Behavior is cooperative.    PAST MEDICAL/SURGICAL HISTORY:  Past Medical History:  Diagnosis Date   Arthritis    Asthma    GERD (gastroesophageal reflux disease)    Hearing loss    Hemorrhoids    Hyperlipidemia    Low testosterone in male    Peripheral neuropathy    Sleep apnea    wears cpap   Sore throat    Wears glasses    Wears hearing aid in both ears    Past Surgical History:  Procedure Laterality Date   ANTERIOR CERVICAL DECOMP/DISCECTOMY FUSION N/A 01/09/2017   Procedure: ANTERIOR CERVICAL DECOMPRESSION FUSION, CERVICAL FOUR-FIVE, CERVICAL FIVE-SIX INSTRUMENTATION AND ALLOGRAFT;  Surgeon: Estill Bamberg, MD;  Location: [facility] OR;  Service: Orthopedics;  Laterality: N/A;  ANTERIOR CERVICAL DECOMPRESSION FUSION, CERVICAL FOUR-FIVE, CERVICAL FIVE-SIX INSTRUMENTATION AND ALLOGRAFT   APPENDECTOMY     BIOPSY  11/12/2019   Procedure: BIOPSY;  Surgeon: Jeani Hawking, MD;  Location: [facility];  Service: Endoscopy;;   CARPAL TUNNEL RELEASE Left    06/2022   CARPAL TUNNEL RELEASE Right    10/2022   COLONOSCOPY     ESOPHAGOGASTRODUODENOSCOPY (EGD) WITH PROPOFOL N/A 11/12/2019   Procedure:  ESOPHAGOGASTRODUODENOSCOPY (EGD) WITH PROPOFOL;  Surgeon: Jeani Hawking, MD;  Location: [facility];  Service: Endoscopy;  Laterality: N/A;   NECK SURGERY     SAVORY DILATION N/A 11/12/2019   Procedure: SAVORY DILATION;  Surgeon: Jeani Hawking, MD;  Location: [facility];  Service: Endoscopy;  Laterality: N/A;   SHOULDER SURGERY  2005   rotator cuff and tendons repaired    SOCIAL HISTORY:  Social History   Socioeconomic History   Marital status: Married    Spouse name: Not on file   Number of children: Not on file   Years of education: Not on file   Highest education level: Not on file  Occupational History   Not on file  Tobacco Use   Smoking status: Former    Current packs/day: 0.25    Average packs/day: 0.3 packs/day for 6.0 years (1.5 ttl pk-yrs)    Types: Cigarettes   Smokeless tobacco: Never  Vaping Use   Vaping status: Never Used  Substance and Sexual Activity   Alcohol use: Not Currently   Drug use: No   Sexual activity: Not on file  Other Topics Concern  Not on file  Social History Narrative   Not on file   Social Determinants of Health   Financial Resource Strain: Low Risk  (01/01/2023)   Overall Financial Resource Strain (CARDIA)    Difficulty of Paying Living Expenses: Not hard at all  Food Insecurity: No Food Insecurity (01/01/2023)   Hunger Vital Sign    Worried About Running Out of Food in the Last Year: Never true    Ran Out of Food in the Last Year: Never true  Transportation Needs: No Transportation Needs (01/01/2023)   PRAPARE - Administrator, Civil Service (Medical): No    Lack of Transportation (Non-Medical): No  Physical Activity: Insufficiently Active (01/01/2023)   Exercise Vital Sign    Days of Exercise per Week: 4 days    Minutes of Exercise per Session: 20 min  Stress: Stress Concern Present (01/01/2023)   [employer] of Occupational Health - Occupational Stress Questionnaire    Feeling of Stress : To some extent   Social Connections: Moderately Integrated (01/01/2023)   Social Connection and Isolation Panel [NHANES]    Frequency of Communication with Friends and Family: Three times a week    Frequency of Social Gatherings with Friends and Family: Once a week    Attends Religious Services: More than 4 times per year    Active Member of [employer] or Organizations: No    Attends Banker Meetings: Never    Marital Status: Married  Catering manager Violence: Not At Risk (01/01/2023)   Humiliation, Afraid, Rape, and Kick questionnaire    Fear of Current or Ex-Partner: No    Emotionally Abused: No    Physically Abused: No    Sexually Abused: No    FAMILY HISTORY:  Family History  Problem Relation Age of Onset   Diabetes Mother    Hypertension Mother    Healthy Father    Cancer Other     CURRENT MEDICATIONS:  Outpatient Encounter Medications as of 01/22/2023  Medication Sig   acetaminophen (TYLENOL) 500 MG tablet Take by mouth.   albuterol (VENTOLIN HFA) 108 (90 Base) MCG/ACT inhaler USE 1 TO 2 INHALATIONS EVERY 6 HOURS AS NEEDED FOR WHEEZING OR SHORTNESS OF BREATH (Patient taking differently: Inhale 2 puffs into the lungs every 6 (six) hours as needed for wheezing or shortness of breath.)   amLODipine (NORVASC) 10 MG tablet Take 10 mg by mouth daily.   aspirin EC 81 MG tablet Take 81 mg by mouth daily.   atorvastatin (LIPITOR) 10 MG tablet TAKE 1 TABLET DAILY (Patient taking differently: Take 10 mg by mouth daily.)   benzonatate (TESSALON) 100 MG capsule Take 1 capsule (100 mg total) by mouth 2 (two) times daily as needed for cough.   Blood Glucose Monitoring Suppl KIT Use as directed to check blood sugars 1 time per day dx:e11.22   Buprenorphine HCl 450 MCG FILM Take 300 mcg by mouth every 12 (twelve) hours.    Buprenorphine HCl 450 MCG FILM PLACE ONE FILM TO CHEEK AND GUM UNTIL DISSOLVED EVERY 12 HOURS   Carboxymethylcellulose Sod PF (REFRESH CELLUVISC) 1 % GEL Apply 1 drop to eye at  bedtime.   Carboxymethylcellulose Sodium (REFRESH TEARS OP) Apply 1 drop to eye in the morning, at noon, in the evening, and at bedtime.   EPINEPHrine 0.3 mg/0.3 mL IJ SOAJ injection Inject 0.3 mg into the muscle as needed for anaphylaxis.   fluticasone (FLONASE) 50 MCG/ACT nasal spray Place 2 sprays into both nostrils daily.  gabapentin (NEURONTIN) 300 MG capsule Take 300 mg by mouth 4 (four) times daily.   glucose blood test strip Use as directed to check blood sugars 1 time per day dx:e11.22   hydrocortisone 2.5 % cream SMARTSIG:1 Topical Daily   ibuprofen (ADVIL) 800 MG tablet Take by mouth.   IRON GLYCINATE PO Take 1 tablet by mouth daily.   Lancets 33G MISC Use as directed to check blood sugars 1 time per day dx:e11.22   lansoprazole (PREVACID) 30 MG capsule Take 30 mg by mouth daily.    levalbuterol (XOPENEX HFA) 45 MCG/ACT inhaler Inhale into the lungs.   LINZESS 145 MCG CAPS capsule Take 145 mcg by mouth daily as needed (constipation).    loratadine (CLARITIN) 10 MG tablet Take 10 mg by mouth daily.   magnesium oxide (MAG-OX) 400 MG tablet Take 400 mg by mouth daily.   meloxicam (MOBIC) 15 MG tablet Take by mouth.   MI-ACID GAS RELIEF 80 MG chewable tablet    montelukast (SINGULAIR) 10 MG tablet TAKE 1 TABLET DAILY (Patient taking differently: Take 10 mg by mouth daily.)   neomycin-polymyxin-hydrocortisone (CORTISPORIN) 3.5-10000-1 OTIC suspension Place 4 drops into both ears 4 (four) times daily. X 7 days   sertraline (ZOLOFT) 100 MG tablet Take 100 mg by mouth daily.   tadalafil (CIALIS) 5 MG tablet Take 5 mg by mouth daily.    tamsulosin (FLOMAX) 0.4 MG CAPS capsule Take 0.4 mg by mouth at bedtime.   temazepam (RESTORIL) 15 MG capsule Take by mouth.   Testosterone 30 MG/ACT SOLN Apply 30 mg topically daily. Apply under each arm   tiZANidine (ZANAFLEX) 4 MG tablet Take 4 mg by mouth 3 (three) times daily.   urea (CARMOL) 20 % cream Apply 1 application topically daily as needed  (Feet pain).    traZODone (DESYREL) 50 MG tablet Take by mouth.   No facility-administered encounter medications on file as of 01/22/2023.    ALLERGIES:  No Known Allergies  LABORATORY DATA:  I have reviewed the labs as listed.  CBC    Component Value Date/Time   WBC 2.8 (L) 01/15/2023 1359   RBC 5.77 01/15/2023 1359   HGB 16.7 01/15/2023 1359   HGB 17.4 12/12/2021 1545   HCT 48.4 01/15/2023 1359   HCT 51.4 (H) 12/12/2021 1545   PLT 125 (L) 01/15/2023 1359   PLT 110 (L) 12/12/2021 1545   MCV 83.9 01/15/2023 1359   MCV 83 12/12/2021 1545   MCH 28.9 01/15/2023 1359   MCHC 34.5 01/15/2023 1359   RDW 13.2 01/15/2023 1359   RDW 13.9 12/12/2021 1545   LYMPHSABS 0.9 01/15/2023 1359   LYMPHSABS 1.0 11/23/2020 1512   MONOABS 0.2 01/15/2023 1359   EOSABS 0.1 01/15/2023 1359   EOSABS 0.3 11/23/2020 1512   BASOSABS 0.0 01/15/2023 1359   BASOSABS 0.0 11/23/2020 1512      Latest Ref Rng & Units 01/15/2023    1:59 PM 01/01/2023    3:20 PM 10/03/2022    1:34 PM  CMP  Glucose 70 - 99 mg/dL 202  542  706   BUN 8 - 23 mg/dL 11  12  13    Creatinine 0.61 - 1.24 mg/dL 2.37  6.28  3.15   Sodium 135 - 145 mmol/L 136  141  136   Potassium 3.5 - 5.1 mmol/L 3.2  3.7  3.3   Chloride 98 - 111 mmol/L 101  102  101   CO2 22 - 32 mmol/L 28  24  26   Calcium 8.9 - 10.3 mg/dL 8.7  9.1  8.9   Total Protein 6.5 - 8.1 g/dL 6.9   6.8   Total Bilirubin 0.3 - 1.2 mg/dL 1.2   0.9   Alkaline Phos 38 - 126 U/L 59   70   AST 15 - 41 U/L 19   21   ALT 0 - 44 U/L 25   33     DIAGNOSTIC IMAGING:  I have independently reviewed the relevant imaging and discussed with the patient.   WRAP UP:  All questions were answered. The patient knows to call the clinic with any problems, questions or concerns.  Medical decision making: Moderate  Time spent on visit: I spent 20 minutes counseling the patient face to face. The total time spent in the appointment was 30 minutes and more than 50% was on  counseling.  Carnella Guadalajara, PA-C  01/22/2023 3:54 PM

## 2023-01-22 NOTE — Patient Instructions (Signed)
[facility] at [facility] **VISIT SUMMARY & IMPORTANT INSTRUCTIONS **   You were seen today by Rojelio Brenner PA-C for your follow-up visit.    LOW PLATELETS & LOW WHITE BLOOD CELLS You continue to have mildly low platelets and low white blood cells. Your tests did NOT show any signs of blood or bone marrow cancer. Your low white blood cells are likely a normal variant and nothing to be concerned about. Your low platelets are possibly due to immune system dysfunction, but do not need treatment at this time. We will see you for follow-up visit in 6 months  IRON DEFICIENCY Your iron levels are improving.  Continue to take iron tablet every day.  ELEVATED RED BLOOD CELLS Your red blood cells and hemoglobin are elevated due to your sleep apnea and your testosterone supplement. Your elevated blood cells put you at increased risk of blood clots, heart, attack and stroke. Take aspirin 81 mg daily. Donate blood once every 3 months  FOLLOW-UP APPOINTMENT: Follow-up visit in 6 months  ** Thank you for trusting me with your healthcare!  I strive to provide all of my patients with quality care at each visit.  If you receive a survey for this visit, I would be so grateful to you for taking the time to provide feedback.  Thank you in advance!  ~ Omer Puccinelli                   Dr. Doreatha Massed   &   Rojelio Brenner, PA-C   - - - - - - - - - - - - - - - - - -    Thank you for choosing [facility] at [facility] to provide your oncology and hematology care.  To afford each patient quality time with our provider, please arrive at least 15 minutes before your scheduled appointment time.   If you have a lab appointment with the Cancer Center please come in thru the Main Entrance and check in at the main information desk.  You need to re-schedule your appointment should you arrive 10 or more minutes late.  We strive to give you quality time with our  providers, and arriving late affects you and other patients whose appointments are after yours.  Also, if you no show three or more times for appointments you may be dismissed from the clinic at the providers discretion.     Again, thank you for choosing [facility].  Our hope is that these requests will decrease the amount of time that you wait before being seen by our physicians.       _____________________________________________________________  Should you have questions after your visit to [facility], please contact our office at [phone] and follow the prompts.  Our office hours are 8:00 a.m. and 4:30 p.m. Monday - Friday.  Please note that voicemails left after 4:00 p.m. may not be returned until the following business day.  We are closed weekends and major holidays.  You do have access to a nurse 24-7, just call the main number to the clinic [phone] and do not press any options, hold on the line and a nurse will answer the phone.    For prescription refill requests, have your pharmacy contact our office and allow 72 hours.

## 2023-01-31 ENCOUNTER — Ambulatory Visit (HOSPITAL_COMMUNITY): Payer: Medicare Other

## 2023-07-02 ENCOUNTER — Ambulatory Visit: Payer: Medicare Other | Admitting: Internal Medicine

## 2023-07-02 ENCOUNTER — Encounter: Payer: Self-pay | Admitting: Internal Medicine

## 2023-07-02 VITALS — BP 108/64 | HR 55 | Temp 98.5°F | Ht 68.0 in | Wt 190.6 lb

## 2023-07-02 DIAGNOSIS — R0789 Other chest pain: Secondary | ICD-10-CM

## 2023-07-02 DIAGNOSIS — R9431 Abnormal electrocardiogram [ECG] [EKG]: Secondary | ICD-10-CM | POA: Diagnosis not present

## 2023-07-02 DIAGNOSIS — D573 Sickle-cell trait: Secondary | ICD-10-CM

## 2023-07-02 DIAGNOSIS — E7800 Pure hypercholesterolemia, unspecified: Secondary | ICD-10-CM | POA: Diagnosis not present

## 2023-07-02 DIAGNOSIS — K219 Gastro-esophageal reflux disease without esophagitis: Secondary | ICD-10-CM

## 2023-07-02 DIAGNOSIS — I10 Essential (primary) hypertension: Secondary | ICD-10-CM

## 2023-07-02 DIAGNOSIS — E611 Iron deficiency: Secondary | ICD-10-CM

## 2023-07-02 DIAGNOSIS — E291 Testicular hypofunction: Secondary | ICD-10-CM

## 2023-07-02 DIAGNOSIS — D696 Thrombocytopenia, unspecified: Secondary | ICD-10-CM

## 2023-07-02 DIAGNOSIS — G8929 Other chronic pain: Secondary | ICD-10-CM

## 2023-07-02 DIAGNOSIS — M545 Low back pain, unspecified: Secondary | ICD-10-CM

## 2023-07-02 MED ORDER — PANTOPRAZOLE SODIUM 40 MG PO TBEC: 40.0000 mg | DELAYED_RELEASE_TABLET | Freq: Every day | ORAL | 1 refills | Status: DC

## 2023-07-02 NOTE — Patient Instructions (Addendum)
 Stop prevacid  Start pantoprazole once daily for reflux   Hypertension, Adult Hypertension is another name for high blood pressure. High blood pressure forces your heart to work harder to pump blood. This can cause problems over time. There are two numbers in a blood pressure reading. There is a top number (systolic) over a bottom number (diastolic). It is best to have a blood pressure that is below 120/80. What are the causes? The cause of this condition is not known. Some other conditions can lead to high blood pressure. What increases the risk? Some lifestyle factors can make you more likely to develop high blood pressure: Smoking. Not getting enough exercise or physical activity. Being overweight. Having too much fat, sugar, calories, or salt (sodium) in your diet. Drinking too much alcohol. Other risk factors include: Having any of these conditions: Heart disease. Diabetes. High cholesterol. Kidney disease. Obstructive sleep apnea. Having a family history of high blood pressure and high cholesterol. Age. The risk increases with age. Stress. What are the signs or symptoms? High blood pressure may not cause symptoms. Very high blood pressure (hypertensive crisis) may cause: Headache. Fast or uneven heartbeats (palpitations). Shortness of breath. Nosebleed. Vomiting or feeling like you may vomit (nauseous). Changes in how you see. Very bad chest pain. Feeling dizzy. Seizures. How is this treated? This condition is treated by making healthy lifestyle changes, such as: Eating healthy foods. Exercising more. Drinking less alcohol. Your doctor may prescribe medicine if lifestyle changes do not help enough and if: Your top number is above 130. Your bottom number is above 80. Your personal target blood pressure may vary. Follow these instructions at home: Eating and drinking  If told, follow the DASH eating plan. To follow this plan: Fill one half of your plate at each  meal with fruits and vegetables. Fill one fourth of your plate at each meal with whole grains. Whole grains include whole-wheat pasta, brown rice, and whole-grain bread. Eat or drink low-fat dairy products, such as skim milk or low-fat yogurt. Fill one fourth of your plate at each meal with low-fat (lean) proteins. Low-fat proteins include fish, chicken without skin, eggs, beans, and tofu. Avoid fatty meat, cured and processed meat, or chicken with skin. Avoid pre-made or processed food. Limit the amount of salt in your diet to less than 1,500 mg each day. Do not drink alcohol if: Your doctor tells you not to drink. You are pregnant, may be pregnant, or are planning to become pregnant. If you drink alcohol: Limit how much you have to: 0-1 drink a day for women. 0-2 drinks a day for men. Know how much alcohol is in your drink. In the [place] one drink equals one 12 oz bottle of beer (355 mL), one 5 oz glass of wine (148 mL), or one 1 oz glass of hard liquor (44 mL). Lifestyle  Work with your doctor to stay at a healthy weight or to lose weight. Ask your doctor what the best weight is for you. Get at least 30 minutes of exercise that causes your heart to beat faster (aerobic exercise) most days of the week. This may include walking, swimming, or biking. Get at least 30 minutes of exercise that strengthens your muscles (resistance exercise) at least 3 days a week. This may include lifting weights or doing Pilates. Do not smoke or use any products that contain nicotine or tobacco. If you need help quitting, ask your doctor. Check your blood pressure at home as told by your  doctor. Keep all follow-up visits. Medicines Take over-the-counter and prescription medicines only as told by your doctor. Follow directions carefully. Do not skip doses of blood pressure medicine. The medicine does not work as well if you skip doses. Skipping doses also puts you at risk for problems. Ask your doctor about  side effects or reactions to medicines that you should watch for. Contact a doctor if: You think you are having a reaction to the medicine you are taking. You have headaches that keep coming back. You feel dizzy. You have swelling in your ankles. You have trouble with your vision. Get help right away if: You get a very bad headache. You start to feel mixed up (confused). You feel weak or numb. You feel faint. You have very bad pain in your: Chest. Belly (abdomen). You vomit more than once. You have trouble breathing. These symptoms may be an emergency. Get help right away. Call 911. Do not wait to see if the symptoms will go away. Do not drive yourself to the hospital. Summary Hypertension is another name for high blood pressure. High blood pressure forces your heart to work harder to pump blood. For most people, a normal blood pressure is less than 120/80. Making healthy choices can help lower blood pressure. If your blood pressure does not get lower with healthy choices, you may need to take medicine. This information is not intended to replace advice given to you by your health care provider. Make sure you discuss any questions you have with your health care provider. Document Revised: 12/28/2020 Document Reviewed: 12/28/2020 Elsevier Patient Education  2024 [employer].

## 2023-07-02 NOTE — Assessment & Plan Note (Signed)
 Chronic, he is on supplemental testosterone as per the [place].  - Will check testosterone levels today - Will also check CBC and LFTs

## 2023-07-02 NOTE — Assessment & Plan Note (Signed)
 Indigestion and reflux not currently managed with lansoprazole. Pantoprazole prescribed with follow-up in 6 weeks. - Discontinue lansoprazole. - Prescribe pantoprazole. - Follow up in 6 weeks to assess effectiveness. - Reminded to stop eating 3 hours prior to lying down

## 2023-07-02 NOTE — Assessment & Plan Note (Signed)
 He is currently atorvastatin 10mg  daily. He is encouraged to follow heart healthy lifestyle. Unfortunately, he never had cardiac calcium CT that was ordered last fall. -Will check lipoprotein (a)

## 2023-07-02 NOTE — Assessment & Plan Note (Signed)
 Most recent Hematology note reviewed in full detail. I will check iron panel and CBC today. He is encouraged to keep f/u Hematology appts.

## 2023-07-02 NOTE — Assessment & Plan Note (Signed)
 Chronic, well controlled.  -Managed with amlodipine 10 mg daily.

## 2023-07-02 NOTE — Assessment & Plan Note (Signed)
 Chronic, followed by Hematology. Most recent note reviewed. Denies recent bruising. Will check CBC today and forward results to Hematology.

## 2023-07-02 NOTE — Assessment & Plan Note (Addendum)
 EKG performed, Sinus Bradycardia -First degree A-V block, w/ negative T waves, possible anterior ischemia. Low voltage.  He has both typical/atypical features.  -Intermittent chest pain with differential diagnosis of heartburn or cardiac origin. Concern for cardiac issues due to age, hypertension, ethnicity and hyperlipidemia. EKG and cardiac ultrasound planned. LP(a) levels to assess genetic predisposition. - Order cardiac ultrasound to evaluate heart function. - Evaluate LP(a) levels to assess genetic predisposition to heart disease. - Consider referral to cardiologist if cardiac issues are suspected.

## 2023-07-02 NOTE — Assessment & Plan Note (Signed)
 Chronic back pain managed with buprenorphine and gabapentin. [facility] with [facility] for foot pain. - Continue current pain management regimen. - Proceed with scheduled nerve study for foot pain.

## 2023-07-02 NOTE — Progress Notes (Signed)
 I,[name], CMA,acting as a Neurosurgeon for [name], MD.,have documented all relevant documentation on the behalf of [name], MD,as directed by  [name], MD while in the presence of [name], MD.  Subjective:  Patient ID: Andrew Fisher , male    DOB: 04/09/1955 , 68 y.o.   MRN: [id number]  Chief Complaint  Patient presents with   Hypertension    Patient presents today for bp & cholesterol follow up. He reports compliance with medications. Denies headache, chest pain & sob. He has no specific questions or concerns.    Hyperlipidemia    HPI Discussed the use of AI scribe software for clinical note transcription with the patient, who gave verbal consent to proceed.  History of Present Illness Andrew Fisher is a 68 year old male with hypertension and hyperlipidemia who presents for a blood pressure and cholesterol check.  He experiences chest pain on the right side and sometimes in the center of the chest, initially thought to be heartburn. Belching occasionally alleviates the pain, and he uses ginger ale for relief. No associated sob/palpitatoins and/or diaphoresis.  No family history of heart disease.  He experiences nausea, particularly upon waking, which he attributes to eating junk food late at night. Dietary changes have led to a decrease in nausea.   He is currently taking amlodipine 10 mg daily for blood pressure, atorvastatin 10 mg daily for cholesterol, aspirin 81 mg daily, buprenorphine (Belbuca) twice daily for back pain, gabapentin four times a day, lansoprazole for reflux, Linzess 145 mcg for constipation, loratadine 10 mg, montelukast, sertraline, Flomax, testosterone injections, tizanidine as needed for back pain, and trazodone for sleep. He has stopped taking meloxicam.  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 His levels had previously stabilized, allowing for less frequent  monitoring, but has recently returned to more frequent checks.  He has a family history of diabetes on both sides, with relatives having severe complications. He is vigilant about his diet to prevent diabetes.  He quit smoking in 1977 and is a disabled veteran, which influences his healthcare access and costs.      Hyperlipidemia This is a chronic problem. The current episode started more than 1 year ago. The problem is controlled. He has no history of liver disease. Associated symptoms include chest pain. Pertinent negatives include no shortness of breath. Current antihyperlipidemic treatment includes statins.  Hypertension This is a chronic problem. The current episode started more than 1 year ago. The problem has been gradually improving since onset. The problem is controlled. Associated symptoms include chest pain. Pertinent negatives include no anxiety, neck pain, orthopnea or shortness of breath. Past treatments include calcium channel blockers. The current treatment provides moderate improvement.     Past Medical History:  Diagnosis Date   Arthritis    Asthma    GERD (gastroesophageal reflux disease)    Hearing loss    Hemorrhoids    Hyperlipidemia    Low testosterone in male    Peripheral neuropathy    Sleep apnea    wears cpap   Sore throat    Wears glasses    Wears hearing aid in both ears      Family History  Problem Relation Age of Onset   Diabetes Mother    Hypertension Mother    Healthy Father    Cancer Other      Current Outpatient Medications:  acetaminophen (TYLENOL) 500 MG tablet, Take by mouth., Disp: , Rfl:    albuterol (VENTOLIN HFA) 108 (90 Base) MCG/ACT inhaler, USE 1 TO 2 INHALATIONS EVERY 6 HOURS AS NEEDED FOR WHEEZING OR SHORTNESS OF BREATH (Patient taking differently: Inhale 2 puffs into the lungs every 6 (six) hours as needed for wheezing or shortness of breath.), Disp: 17 g, Rfl: 4   amLODipine (NORVASC) 10 MG tablet, Take 10 mg by mouth  daily., Disp: , Rfl:    aspirin EC 81 MG tablet, Take 81 mg by mouth daily., Disp: , Rfl:    atorvastatin (LIPITOR) 10 MG tablet, TAKE 1 TABLET DAILY (Patient taking differently: Take 10 mg by mouth daily.), Disp: 90 tablet, Rfl: 3   benzonatate (TESSALON) 100 MG capsule, Take 1 capsule (100 mg total) by mouth 2 (two) times daily as needed for cough., Disp: 20 capsule, Rfl: 0   Blood Glucose Monitoring Suppl KIT, Use as directed to check blood sugars 1 time per day dx:e11.22, Disp: 1 kit, Rfl: 1   Buprenorphine HCl 450 MCG FILM, Take 300 mcg by mouth every 12 (twelve) hours. , Disp: , Rfl:    Buprenorphine HCl 450 MCG FILM, PLACE ONE FILM TO CHEEK AND GUM UNTIL DISSOLVED EVERY 12 HOURS, Disp: , Rfl:    Carboxymethylcellulose Sod PF (REFRESH CELLUVISC) 1 % GEL, Apply 1 drop to eye at bedtime., Disp: , Rfl:    Carboxymethylcellulose Sodium (REFRESH TEARS OP), Apply 1 drop to eye in the morning, at noon, in the evening, and at bedtime., Disp: , Rfl:    EPINEPHrine 0.3 mg/0.3 mL IJ SOAJ injection, Inject 0.3 mg into the muscle as needed for anaphylaxis., Disp: 1 each, Rfl: 2   fluticasone (FLONASE) 50 MCG/ACT nasal spray, Place 2 sprays into both nostrils daily., Disp: 16 g, Rfl: 0   gabapentin (NEURONTIN) 300 MG capsule, Take 300 mg by mouth 4 (four) times daily., Disp: , Rfl:    glucose blood test strip, Use as directed to check blood sugars 1 time per day dx:e11.22, Disp: 150 each, Rfl: 2   hydrocortisone 2.5 % cream, SMARTSIG:1 Topical Daily, Disp: , Rfl:    ibuprofen (ADVIL) 800 MG tablet, Take by mouth., Disp: , Rfl:    IRON GLYCINATE PO, Take 1 tablet by mouth daily., Disp: , Rfl:    Lancets 33G MISC, Use as directed to check blood sugars 1 time per day dx:e11.22, Disp: 150 each, Rfl: 2   levalbuterol (XOPENEX HFA) 45 MCG/ACT inhaler, Inhale into the lungs., Disp: , Rfl:    LINZESS 145 MCG CAPS capsule, Take 145 mcg by mouth daily as needed (constipation). , Disp: , Rfl:    loratadine  (CLARITIN) 10 MG tablet, Take 10 mg by mouth daily., Disp: , Rfl:    MI-ACID GAS RELIEF 80 MG chewable tablet, , Disp: , Rfl:    montelukast (SINGULAIR) 10 MG tablet, TAKE 1 TABLET DAILY (Patient taking differently: Take 10 mg by mouth daily.), Disp: 90 tablet, Rfl: 1   neomycin-polymyxin-hydrocortisone (CORTISPORIN) 3.5-10000-1 OTIC suspension, Place 4 drops into both ears 4 (four) times daily. X 7 days, Disp: 10 mL, Rfl: 0   pantoprazole (PROTONIX) 40 MG tablet, Take 1 tablet (40 mg total) by mouth daily., Disp: 90 tablet, Rfl: 1   sertraline (ZOLOFT) 100 MG tablet, Take 100 mg by mouth daily., Disp: , Rfl:    tadalafil (CIALIS) 5 MG tablet, Take 5 mg by mouth daily. , Disp: , Rfl:    tamsulosin (FLOMAX) 0.4 MG  CAPS capsule, Take 0.4 mg by mouth at bedtime., Disp: , Rfl:    temazepam (RESTORIL) 15 MG capsule, Take by mouth., Disp: , Rfl:    Testosterone 30 MG/ACT SOLN, Apply 30 mg topically daily. Apply under each arm, Disp: , Rfl:    tiZANidine (ZANAFLEX) 4 MG tablet, Take 4 mg by mouth 3 (three) times daily., Disp: , Rfl:    traZODone (DESYREL) 50 MG tablet, Take by mouth., Disp: , Rfl:    urea (CARMOL) 20 % cream, Apply 1 application topically daily as needed (Feet pain). , Disp: , Rfl:    No Known Allergies   Review of Systems  Constitutional: Negative.   HENT: Negative.    Respiratory: Negative.  Negative for shortness of breath.   Cardiovascular:  Positive for chest pain. Negative for orthopnea.  Gastrointestinal: Negative.        Increased belching, indigestion  Genitourinary: Negative.   Musculoskeletal:  Negative for neck pain.  Skin: Negative.   Allergic/Immunologic: Negative.   Neurological: Negative.   Hematological: Negative.      Today's Vitals   07/02/23 1422  BP: 108/64  Pulse: (!) 55  Temp: 98.5 F (36.9 C)  SpO2: 98%  Weight: 190 lb 9.6 oz (86.5 kg)  Height: 5\' 8"  (1.727 m)   Body mass index is 28.98 kg/m.  Wt Readings from Last 3 Encounters:  07/02/23  190 lb 9.6 oz (86.5 kg)  01/22/23 202 lb (91.6 kg)  01/01/23 201 lb (91.2 kg)     Objective:  Physical Exam Vitals and nursing note reviewed.  Constitutional:      Appearance: Normal appearance.  HENT:     Head: Normocephalic and atraumatic.  Eyes:     Extraocular Movements: Extraocular movements intact.  Cardiovascular:     Rate and Rhythm: Normal rate and regular rhythm.     Heart sounds: Normal heart sounds.  Pulmonary:     Effort: Pulmonary effort is normal.     Breath sounds: Normal breath sounds.  Musculoskeletal:     Cervical back: Normal range of motion.  Skin:    General: Skin is warm.  Neurological:     General: No focal deficit present.     Mental Status: He is alert.  Psychiatric:        Mood and Affect: Mood normal.         Assessment And Plan:  Essential hypertension, benign Assessment & Plan: Chronic, well controlled.  -Managed with amlodipine 10 mg daily.   Pure hypercholesterolemia Assessment & Plan: He is currently atorvastatin 10mg  daily. He is encouraged to follow heart healthy lifestyle. Unfortunately, he never had cardiac calcium CT that was ordered last fall. -Will check lipoprotein (a)  Orders: -     CMP14+EGFR -     Lipid panel -     Lipoprotein A (LPA)  Atypical chest pain Assessment & Plan: EKG performed, Sinus Bradycardia -First degree A-V block, w/ negative T waves, possible anterior ischemia. Low voltage.  He has both typical/atypical features.  -Intermittent chest pain with differential diagnosis of heartburn or cardiac origin. Concern for cardiac issues due to age, hypertension, ethnicity and hyperlipidemia. EKG and cardiac ultrasound planned. LP(a) levels to assess genetic predisposition. - Order cardiac ultrasound to evaluate heart function. - Evaluate LP(a) levels to assess genetic predisposition to heart disease. - Consider referral to cardiologist if cardiac issues are suspected.  Orders: -     EKG 12-Lead -      ECHOCARDIOGRAM COMPLETE; Future -  Ambulatory referral to Cardiology  Abnormal EKG -     Ambulatory referral to Cardiology  Gastroesophageal reflux disease without esophagitis Assessment & Plan: Indigestion and reflux not currently managed with lansoprazole. Pantoprazole prescribed with follow-up in 6 weeks. - Discontinue lansoprazole. - Prescribe pantoprazole. - Follow up in 6 weeks to assess effectiveness. - Reminded to stop eating 3 hours prior to lying down   Chronic bilateral low back pain without sciatica Assessment & Plan: Chronic back pain managed with buprenorphine and gabapentin. [facility] with [facility] nerve [facility] for foot pain. - Continue current pain management regimen. - Proceed with scheduled nerve study for foot pain.   Thrombocytopenia (HCC) Assessment & Plan: Chronic, followed by Hematology. Most recent note reviewed. Denies recent bruising. Will check CBC today and forward results to Hematology.   Orders: -     CBC with Differential/Platelet  Dietary iron deficiency without anemia Assessment & Plan: Most recent Hematology note reviewed in full detail. I will check iron panel and CBC today. He is encouraged to keep f/u Hematology appts.   Orders: -     Iron, TIBC and Ferritin Panel  Hypogonadism in male Assessment & Plan: Chronic, [facility] is [facility]  - Will check testosterone levels today - Will also check CBC and LFTs  Orders: -     Testosterone,Free and Total -     CBC with Differential/Platelet  Sickle cell trait (HCC) -     CBC with Differential/Platelet  Other orders -     Pantoprazole Sodium; Take 1 tablet (40 mg total) by mouth daily.  Dispense: 90 tablet; Refill: 1  Follow-up Follow-up plans discussed for ongoing management. Labs ready by end of week. Communication with oncology office advised. - Return in 6 weeks to assess response to pantoprazole. - Coordinate with hematologist  for follow-up appointments. - Monitor lab results and communicate with oncology office. and Plan   Return if symptoms worsen or fail to improve.  Patient was given opportunity to ask questions. Patient verbalized understanding of the plan and was able to repeat key elements of the plan. All questions were answered to their satisfaction.    I, [name], MD, have reviewed all documentation for this visit. The documentation on 07/02/23 for the exam, diagnosis, procedures, and orders are all accurate and complete.   IF YOU HAVE BEEN REFERRED TO A SPECIALIST, IT MAY TAKE 1-2 WEEKS TO SCHEDULE/PROCESS THE REFERRAL. IF YOU HAVE NOT HEARD FROM US/SPECIALIST IN TWO WEEKS, PLEASE GIVE [place] A CALL AT [phone].   THE PATIENT IS ENCOURAGED TO PRACTICE SOCIAL DISTANCING DUE TO THE COVID-19 PANDEMIC.

## 2023-07-05 LAB — IRON,TIBC AND FERRITIN PANEL
Ferritin: 395 ng/mL (ref 30–400)
Iron Saturation: 31 % (ref 15–55)
Iron: 77 ug/dL (ref 38–169)
Total Iron Binding Capacity: 250 ug/dL (ref 250–450)
UIBC: 173 ug/dL (ref 111–343)

## 2023-07-05 LAB — CBC WITH DIFFERENTIAL/PLATELET
Basophils Absolute: 0 10*3/uL (ref 0.0–0.2)
Basos: 0 %
EOS (ABSOLUTE): 0.1 10*3/uL (ref 0.0–0.4)
Eos: 3 %
Hematocrit: 45.3 % (ref 37.5–51.0)
Hemoglobin: 14.9 g/dL (ref 13.0–17.7)
Immature Grans (Abs): 0 10*3/uL (ref 0.0–0.1)
Immature Granulocytes: 0 %
Lymphocytes Absolute: 0.9 10*3/uL (ref 0.7–3.1)
Lymphs: 28 %
MCH: 28.9 pg (ref 26.6–33.0)
MCHC: 32.9 g/dL (ref 31.5–35.7)
MCV: 88 fL (ref 79–97)
Monocytes Absolute: 0.3 10*3/uL (ref 0.1–0.9)
Monocytes: 9 %
Neutrophils Absolute: 2 10*3/uL (ref 1.4–7.0)
Neutrophils: 60 %
Platelets: 138 10*3/uL — ABNORMAL LOW (ref 150–450)
RBC: 5.16 x10E6/uL (ref 4.14–5.80)
RDW: 13.1 % (ref 11.6–15.4)
WBC: 3.3 10*3/uL — ABNORMAL LOW (ref 3.4–10.8)

## 2023-07-05 LAB — CMP14+EGFR
ALT: 22 IU/L (ref 0–44)
AST: 22 IU/L (ref 0–40)
Albumin: 4.3 g/dL (ref 3.9–4.9)
Alkaline Phosphatase: 79 IU/L (ref 44–121)
BUN/Creatinine Ratio: 12 (ref 10–24)
BUN: 12 mg/dL (ref 8–27)
Bilirubin Total: 0.6 mg/dL (ref 0.0–1.2)
CO2: 25 mmol/L (ref 20–29)
Calcium: 9.2 mg/dL (ref 8.6–10.2)
Chloride: 102 mmol/L (ref 96–106)
Creatinine, Ser: 0.99 mg/dL (ref 0.76–1.27)
Globulin, Total: 2.2 g/dL (ref 1.5–4.5)
Glucose: 88 mg/dL (ref 70–99)
Potassium: 4 mmol/L (ref 3.5–5.2)
Sodium: 142 mmol/L (ref 134–144)
Total Protein: 6.5 g/dL (ref 6.0–8.5)
eGFR: 83 mL/min/{1.73_m2} (ref >59)

## 2023-07-05 LAB — LIPID PANEL
Chol/HDL Ratio: 3.9 ratio (ref 0.0–5.0)
Cholesterol, Total: 140 mg/dL (ref 100–199)
HDL: 36 mg/dL — ABNORMAL LOW (ref >39)
LDL Chol Calc (NIH): 85 mg/dL (ref 0–99)
Triglycerides: 102 mg/dL (ref 0–149)
VLDL Cholesterol Cal: 19 mg/dL (ref 5–40)

## 2023-07-05 LAB — TESTOSTERONE,FREE AND TOTAL
Testosterone, Free: 3 pg/mL — ABNORMAL LOW (ref 6.6–18.1)
Testosterone: 289 ng/dL (ref 264–916)

## 2023-07-05 LAB — LIPOPROTEIN A (LPA): Lipoprotein (a): 23.4 nmol/L (ref <75.0)

## 2023-07-07 ENCOUNTER — Encounter: Payer: Self-pay | Admitting: Internal Medicine

## 2023-07-16 ENCOUNTER — Inpatient Hospital Stay: Payer: Medicare Other

## 2023-07-17 ENCOUNTER — Ambulatory Visit (HOSPITAL_COMMUNITY)
Admission: RE | Admit: 2023-07-17 | Discharge: 2023-07-17 | Disposition: A | Discharge status: Home / Self Care | Source: Ambulatory Visit | Attending: Internal Medicine | Admitting: Internal Medicine

## 2023-07-17 DIAGNOSIS — R0789 Other chest pain: Secondary | ICD-10-CM | POA: Diagnosis present

## 2023-07-17 LAB — ECHOCARDIOGRAM COMPLETE
AR max vel: 2.8 cm2
AV Area VTI: 2.76 cm2
AV Area mean vel: 2.46 cm2
AV Mean grad: 3.3 mmHg
AV Peak grad: 6 mmHg
Ao pk vel: 1.22 m/s
Area-P 1/2: 4.15 cm2
S' Lateral: 3 cm

## 2023-07-17 NOTE — Progress Notes (Signed)
*  PRELIMINARY RESULTS* Echocardiogram 2D Echocardiogram has been performed.  Bernis Brisker 07/17/2023, 9:19 AM

## 2023-07-21 ENCOUNTER — Encounter: Payer: Self-pay | Admitting: Internal Medicine

## 2023-07-22 NOTE — Progress Notes (Unsigned)
 [facility] [address], [place] [address]   CLINIC:  Medical Oncology/Hematology  PCP:  Cleave Curling, MD [address] [place] [address] [phone]    REASON FOR VISIT:  Follow-up for sickle cell trait, iron deficiency without anemia, mild thrombocytopenia   PRIOR THERAPY: None   CURRENT THERAPY: Ferrous bisglycinate  INTERVAL HISTORY:   [facility] 68 y.o. male returns for routine follow-up of iron deficiency, thrombocytopenia, and sickle cell trait.  He was last seen by Sheril Dines PA-C on 01/22/2019 for.  At today's visit, he reports*** experiencing ongoing fatigue, arthralgia, and neuropathic pain. ***  He continues to take iron tablet (ferrous bisglycinate) daily.  *** *** No rectal bleeding, melena, easy bruising, or petechial rash.   ***No B symptoms or frequent infections.  He reports intermittent shortness of breath from asthma.  *** *** Occasional chest pain when he lies down after eating, which he attributes to heartburn.   ***He has ongoing dizziness with position changes that he states feels similar to his previous bouts of vertigo.  *** He has occasional headaches.  ***  No syncope.   ***He has given blood once in the past 3 months. *** He continues to maintain excellent compliance with his CPAP, and reports it is working better now that he has a new mask. *** Still using topical testosterone ?  He has 50***% energy and 75***% appetite. He endorses that he is maintaining a stable weight.  ASSESSMENT & PLAN:  1.  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 - Workup of thrombocytopenia unremarkable: Normal nutritional panel (02/26/2022) with normal copper , iron, folate, methylmalonic acid, B12 Normal immature platelet fraction at 3.8% Normal SPEP (November 2022) Negative ANA and  rheumatoid factor (November 2021) Negative H. pylori IgG antibodies (testing done by [facility] December 2023) Negative for hepatitis B, hepatitis C. - US  abdomen (06/27/2022): Negative for hepatosplenomegaly. - [facility] workup was unremarkable, and hematologist felt that thrombocytopenia was possibly due to ITP.  He was recommended to follow-up with local hematology every 3 months, and if stable transition to every 37-month follow-up. Bone marrow biopsy (08/14/2022): Normocellular marrow with no evidence of dysplasia or increase in blasts.  Myeloid hypoplasia and absent iron stores. NGS panel negative. 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  Neutropenia: - Chronic neutropenia since at least 2017 with baseline WBC 2.5-3.5 and ANC 1.3-1.5 - SPEP negative.  Normal folate, B12 and methylmalonic acid.  LDH normal. 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 - CBC/D with office visit in  6 months  3.  Iron deficiency without anemia  - EGD (11/12/2019): Normal stomach and duodenum.   No significant abnormalities in the esophagus. - Colonoscopy (08/18/2018): Colon polyp, otherwise unremarkable. - Taking daily iron supplementation (ferrous bisglycinate) since November 2022 - Bone marrow biopsy ([facility] 08/14/2022) showed absent iron stores in bone marrow - No hematemesis, hematochezia, or melena*** - Most recent labs (07/02/2023): Ferritin 395, iron saturation 31% - PLAN: Patient can discontinue iron supplement at this time. - No need for IV iron at this time, but will consider IV iron if it is still low at follow-up - Repeat CBC/iron panel and RTC in 6 months.  4.  Erythrocytosis - He is noted to have mild intermittent erythrocytosis, likely related to OSA and topical testosterone  supplements. *** Also has some chronic elevations in RBCs with microcytosis that is attributable to his sickle cell trait. - [facility] workup with normal bone marrow biopsy (08/14/2022), normal cytogenetics, and normal NGS panel - He uses CPAP for OSA, but reports that he "thinks the machine or mask is not working right."  He was recently switched to BiPAP.*** - Most recent CBC/D (07/02/2023): Hgb 14.9/hematocrit 45.3% - He has fatigue, dizziness, and headaches.  No aquagenic pruritus.*** - He recalls that he used to donate blood every 3 months at the direction of his urologist.  ***He stopped doing this about a year ago and reports that he has felt worse since that time.  Restarted voluntary phlebotomy in July 2024, with most recent blood donation on ***. 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  Sickle cell trait - Hemoglobin electrophoresis (02/02/2021) consistent with sickle cell trait  (heterozygous) - Abdominal US  06/27/2022 with normal appearing kidneys, no masses. Patient remains asymptomatic.  - Patient is asymptomatic, has never had any symptoms suggestive of acute pain crisis or venoocclusive events.   - Discussed with patient regarding sickle cell trait: Benign carrier condition that rarely results in significant clinical issues. In very rare cases patients can have symptoms of acute pain and venoocclusive disease. At risk for some blood sickling in cases of significant hypoxia (such as high-altitude and pressurized aircraft, very strenuous exercise with dehydration, etc.). Children and grandchildren would benefit from being screened for sickle cell trait to assist with future reproductive decisions. - PLAN: No indication for ongoing hematology follow-up of asymptomatic sickle cell trait.   6.  Social/family history: - He worked in the [employer] for 23 years and retired as a first Chief Strategy Officer.  No clear exposure to chemicals.  Quit smoking in 1979. - He has sleep apnea and has been using CPAP since 2006. - Son had HBs trait.  Mother is a Jehovah witness and also had colon cancer.  Maternal aunt died of ovarian cancer in her 55s.   PLAN SUMMARY: *** Review *** >> Labs in 6 months = CBC/D, CMP, LDH, ferritin, iron/TIBC >> OFFICE visit in 6 months (1 week after labs)     REVIEW OF SYSTEMS: ***  Review of Systems  Constitutional:  Positive for fatigue. Negative for appetite change, chills, diaphoresis, fever and unexpected weight change.  HENT:   Negative for lump/mass and nosebleeds.   Eyes:  Negative for eye problems.  Respiratory:  Positive for shortness of breath (at  times, none today). Negative for cough and hemoptysis.   Cardiovascular:  Positive for chest pain (at times, none today). Negative for leg swelling and palpitations.  Gastrointestinal:  Positive for nausea. Negative for abdominal pain, blood in stool, constipation, diarrhea and vomiting.   Genitourinary:  Negative for hematuria.   Musculoskeletal:  Positive for arthralgias and back pain.  Skin: Negative.   Neurological:  Positive for dizziness, headaches and numbness. Negative for light-headedness.  Hematological:  Does not bruise/bleed easily.  Psychiatric/Behavioral:  Positive for depression and sleep disturbance. The patient is nervous/anxious.      PHYSICAL EXAM:  ECOG PERFORMANCE STATUS: 1 - Symptomatic but completely ambulatory *** There were no vitals filed for this visit.  There were no vitals filed for this visit.  Physical Exam Constitutional:      Appearance: Normal appearance. He is obese.  Cardiovascular:     Heart sounds: Normal heart sounds.  Pulmonary:     Breath sounds: Normal breath sounds.  Neurological:     General: No focal deficit present.     Mental Status: Mental status is at baseline.  Psychiatric:        Behavior: Behavior normal. Behavior is cooperative.    PAST MEDICAL/SURGICAL HISTORY:  Past Medical History:  Diagnosis Date   Arthritis    Asthma    GERD (gastroesophageal reflux disease)    Hearing loss    Hemorrhoids    Hyperlipidemia    Low testosterone  in male    Peripheral neuropathy    Sleep apnea    wears cpap   Sore throat    Wears glasses    Wears hearing aid in both ears    Past Surgical History:  Procedure Laterality Date   ANTERIOR CERVICAL DECOMP/DISCECTOMY FUSION N/A 01/09/2017   Procedure: ANTERIOR CERVICAL DECOMPRESSION FUSION, CERVICAL FOUR-FIVE, CERVICAL FIVE-SIX INSTRUMENTATION AND ALLOGRAFT;  Surgeon: Virl Grimes, MD;  Location: [facility] OR;  Service: Orthopedics;  Laterality: N/A;  ANTERIOR CERVICAL DECOMPRESSION FUSION, CERVICAL FOUR-FIVE, CERVICAL FIVE-SIX INSTRUMENTATION AND ALLOGRAFT   APPENDECTOMY     BIOPSY  11/12/2019   Procedure: BIOPSY;  Surgeon: Alvis Jourdain, MD;  Location: [facility];  Service: Endoscopy;;   CARPAL TUNNEL RELEASE Left    06/2022   CARPAL TUNNEL RELEASE Right    10/2022    COLONOSCOPY     ESOPHAGOGASTRODUODENOSCOPY (EGD) WITH PROPOFOL  N/A 11/12/2019   Procedure: ESOPHAGOGASTRODUODENOSCOPY (EGD) WITH PROPOFOL ;  Surgeon: Alvis Jourdain, MD;  Location: [facility];  Service: Endoscopy;  Laterality: N/A;   NECK SURGERY     SAVORY DILATION N/A 11/12/2019   Procedure: SAVORY DILATION;  Surgeon: Alvis Jourdain, MD;  Location: [facility];  Service: Endoscopy;  Laterality: N/A;   SHOULDER SURGERY  2005   rotator cuff and tendons repaired    SOCIAL HISTORY:  Social History   Socioeconomic History   Marital status: Married    Spouse name: Not on file   Number of children: Not on file   Years of education: Not on file   Highest education level: Not on file  Occupational History   Not on file  Tobacco Use   Smoking status: Former    Current packs/day: 0.25    Average packs/day: 0.3 packs/day for 6.0 years (1.5 ttl pk-yrs)    Types: Cigarettes   Smokeless tobacco: Never  Vaping Use   Vaping status: Never Used  Substance and Sexual Activity   Alcohol use: Not Currently   Drug use: No   Sexual activity: Not on file  Other Topics  Concern   Not on file  Social History Narrative   Not on file   Social Drivers of Health   Financial Resource Strain: Low Risk  (01/01/2023)   Overall Financial Resource Strain (CARDIA)    Difficulty of Paying Living Expenses: Not hard at all  Food Insecurity: No Food Insecurity (01/01/2023)   Hunger Vital Sign    Worried About Running Out of Food in the Last Year: Never true    Ran Out of Food in the Last Year: Never true  Transportation Needs: No Transportation Needs (01/01/2023)   PRAPARE - Administrator, Civil Service (Medical): No    Lack of Transportation (Non-Medical): No  Physical Activity: Insufficiently Active (01/01/2023)   Exercise Vital Sign    Days of Exercise per Week: 4 days    Minutes of Exercise per Session: 20 min  Stress: Stress Concern Present (01/01/2023)   [employer] of Occupational  Health - Occupational Stress Questionnaire    Feeling of Stress : To some extent  Social Connections: Moderately Integrated (01/01/2023)   Social Connection and Isolation Panel [NHANES]    Frequency of Communication with Friends and Family: Three times a week    Frequency of Social Gatherings with Friends and Family: Once a week    Attends Religious Services: More than 4 times per year    Active Member of [employer] or Organizations: No    Attends Banker Meetings: Never    Marital Status: Married  Catering manager Violence: Not At Risk (01/01/2023)   Humiliation, Afraid, Rape, and Kick questionnaire    Fear of Current or Ex-Partner: No    Emotionally Abused: No    Physically Abused: No    Sexually Abused: No    FAMILY HISTORY:  Family History  Problem Relation Age of Onset   Diabetes Mother    Hypertension Mother    Healthy Father    Cancer Other     CURRENT MEDICATIONS:  Outpatient Encounter Medications as of 07/23/2023  Medication Sig   acetaminophen  (TYLENOL ) 500 MG tablet Take by mouth.   albuterol  (VENTOLIN  HFA) 108 (90 Base) MCG/ACT inhaler USE 1 TO 2 INHALATIONS EVERY 6 HOURS AS NEEDED FOR WHEEZING OR SHORTNESS OF BREATH (Patient taking differently: Inhale 2 puffs into the lungs every 6 (six) hours as needed for wheezing or shortness of breath.)   amLODipine (NORVASC) 10 MG tablet Take 10 mg by mouth daily.   aspirin EC 81 MG tablet Take 81 mg by mouth daily.   atorvastatin  (LIPITOR) 10 MG tablet TAKE 1 TABLET DAILY (Patient taking differently: Take 10 mg by mouth daily.)   benzonatate  (TESSALON ) 100 MG capsule Take 1 capsule (100 mg total) by mouth 2 (two) times daily as needed for cough.   Blood Glucose Monitoring Suppl KIT Use as directed to check blood sugars 1 time per day dx:e11.22   Buprenorphine HCl 450 MCG FILM Take 300 mcg by mouth every 12 (twelve) hours.    Buprenorphine HCl 450 MCG FILM PLACE ONE FILM TO CHEEK AND GUM UNTIL DISSOLVED EVERY 12 HOURS    Carboxymethylcellulose Sod PF (REFRESH CELLUVISC) 1 % GEL Apply 1 drop to eye at bedtime.   Carboxymethylcellulose Sodium (REFRESH TEARS OP) Apply 1 drop to eye in the morning, at noon, in the evening, and at bedtime.   EPINEPHrine  0.3 mg/0.3 mL IJ SOAJ injection Inject 0.3 mg into the muscle as needed for anaphylaxis.   fluticasone  (FLONASE ) 50 MCG/ACT nasal spray Place 2 sprays into  both nostrils daily.   gabapentin  (NEURONTIN ) 300 MG capsule Take 300 mg by mouth 4 (four) times daily.   glucose blood test strip Use as directed to check blood sugars 1 time per day dx:e11.22   hydrocortisone  2.5 % cream SMARTSIG:1 Topical Daily   ibuprofen (ADVIL) 800 MG tablet Take by mouth.   IRON GLYCINATE PO Take 1 tablet by mouth daily.   Lancets 33G MISC Use as directed to check blood sugars 1 time per day dx:e11.22   levalbuterol (XOPENEX HFA) 45 MCG/ACT inhaler Inhale into the lungs.   LINZESS 145 MCG CAPS capsule Take 145 mcg by mouth daily as needed (constipation).    loratadine  (CLARITIN ) 10 MG tablet Take 10 mg by mouth daily.   MI-ACID GAS RELIEF 80 MG chewable tablet    montelukast  (SINGULAIR ) 10 MG tablet TAKE 1 TABLET DAILY (Patient taking differently: Take 10 mg by mouth daily.)   neomycin -polymyxin-hydrocortisone  (CORTISPORIN) 3.5-10000-1 OTIC suspension Place 4 drops into both ears 4 (four) times daily. X 7 days   pantoprazole  (PROTONIX ) 40 MG tablet Take 1 tablet (40 mg total) by mouth daily.   sertraline (ZOLOFT) 100 MG tablet Take 100 mg by mouth daily.   tadalafil (CIALIS) 5 MG tablet Take 5 mg by mouth daily.    tamsulosin  (FLOMAX ) 0.4 MG CAPS capsule Take 0.4 mg by mouth at bedtime.   temazepam (RESTORIL) 15 MG capsule Take by mouth.   Testosterone  30 MG/ACT SOLN Apply 30 mg topically daily. Apply under each arm   tiZANidine (ZANAFLEX) 4 MG tablet Take 4 mg by mouth 3 (three) times daily.   traZODone (DESYREL) 50 MG tablet Take by mouth.   urea (CARMOL) 20 % cream Apply 1 application  topically daily as needed (Feet pain).    No facility-administered encounter medications on file as of 07/23/2023.    ALLERGIES:  No Known Allergies  LABORATORY DATA:  I have reviewed the labs as listed.  CBC    Component Value Date/Time   WBC 3.3 (L) 07/02/2023 1515   WBC 2.8 (L) 01/15/2023 1359   RBC 5.16 07/02/2023 1515   RBC 5.77 01/15/2023 1359   HGB 14.9 07/02/2023 1515   HCT 45.3 07/02/2023 1515   PLT 138 (L) 07/02/2023 1515   MCV 88 07/02/2023 1515   MCH 28.9 07/02/2023 1515   MCH 28.9 01/15/2023 1359   MCHC 32.9 07/02/2023 1515   MCHC 34.5 01/15/2023 1359   RDW 13.1 07/02/2023 1515   LYMPHSABS 0.9 07/02/2023 1515   MONOABS 0.2 01/15/2023 1359   EOSABS 0.1 07/02/2023 1515   BASOSABS 0.0 07/02/2023 1515      Latest Ref Rng & Units 07/02/2023    3:15 PM 01/15/2023    1:59 PM 01/01/2023    3:20 PM  CMP  Glucose 70 - 99 mg/dL 88  161  096   BUN 8 - 27 mg/dL 12  11  12    Creatinine 0.76 - 1.27 mg/dL 0.45  4.09  8.11   Sodium 134 - 144 mmol/L 142  136  141   Potassium 3.5 - 5.2 mmol/L 4.0  3.2  3.7   Chloride 96 - 106 mmol/L 102  101  102   CO2 20 - 29 mmol/L 25  28  24    Calcium  8.6 - 10.2 mg/dL 9.2  8.7  9.1   Total Protein 6.0 - 8.5 g/dL 6.5  6.9    Total Bilirubin 0.0 - 1.2 mg/dL 0.6  1.2    Alkaline Phos 44 -  121 IU/L 79  59    AST 0 - 40 IU/L 22  19    ALT 0 - 44 IU/L 22  25      DIAGNOSTIC IMAGING:  I have independently reviewed the relevant imaging and discussed with the patient.   WRAP UP:  All questions were answered. The patient knows to call the clinic with any problems, questions or concerns.  Medical decision making: Moderate***  Time spent on visit: I spent 20 minutes counseling the patient face to face. The total time spent in the appointment was 30 minutes and more than 50% was on counseling.  Sonnie Dusky, PA-C  ***

## 2023-07-23 ENCOUNTER — Inpatient Hospital Stay: Payer: Medicare Other | Attending: Physician Assistant | Admitting: Physician Assistant

## 2023-07-23 VITALS — BP 126/81 | HR 62 | Temp 97.8°F | Resp 18 | Wt 188.7 lb

## 2023-07-23 DIAGNOSIS — D509 Iron deficiency anemia, unspecified: Secondary | ICD-10-CM | POA: Diagnosis not present

## 2023-07-23 DIAGNOSIS — Z67A1 Duffy null: Secondary | ICD-10-CM | POA: Diagnosis not present

## 2023-07-23 DIAGNOSIS — D573 Sickle-cell trait: Secondary | ICD-10-CM | POA: Diagnosis not present

## 2023-07-23 DIAGNOSIS — Z800 Family history of malignant neoplasm of digestive organs: Secondary | ICD-10-CM | POA: Insufficient documentation

## 2023-07-23 DIAGNOSIS — Z7982 Long term (current) use of aspirin: Secondary | ICD-10-CM | POA: Diagnosis not present

## 2023-07-23 DIAGNOSIS — D709 Neutropenia, unspecified: Secondary | ICD-10-CM | POA: Diagnosis not present

## 2023-07-23 DIAGNOSIS — D696 Thrombocytopenia, unspecified: Secondary | ICD-10-CM | POA: Insufficient documentation

## 2023-07-23 DIAGNOSIS — Z87891 Personal history of nicotine dependence: Secondary | ICD-10-CM | POA: Diagnosis not present

## 2023-07-23 DIAGNOSIS — E611 Iron deficiency: Secondary | ICD-10-CM | POA: Diagnosis not present

## 2023-07-23 DIAGNOSIS — R718 Other abnormality of red blood cells: Secondary | ICD-10-CM

## 2023-07-23 DIAGNOSIS — D751 Secondary polycythemia: Secondary | ICD-10-CM | POA: Insufficient documentation

## 2023-07-23 DIAGNOSIS — Z8041 Family history of malignant neoplasm of ovary: Secondary | ICD-10-CM | POA: Diagnosis not present

## 2023-07-23 NOTE — Patient Instructions (Signed)
 [facility] **VISIT SUMMARY & IMPORTANT INSTRUCTIONS **   You were seen today by Sheril Dines PA-C for your follow-up visit.    LOW PLATELETS & LOW Andrew BLOOD CELLS You continue to have mildly low platelets and low Andrew blood cells. Your tests did NOT show any signs of blood or bone marrow cancer. Your low Andrew blood cells are likely a normal variant and nothing to be concerned about. Your low platelets are possibly due to immune system dysfunction, but do not need treatment at this time. We will see you for follow-up visit in 6 months  IRON DEFICIENCY Your iron levels look great! You can stop taking your iron tablet for now, but we will recheck your levels in 6 months.  ELEVATED RED BLOOD CELLS Your red blood cells and hemoglobin were previously elevated due to your testosterone  supplement. Since you have not been taking testosterone  supplement lately, your blood counts are back to normal. Once you restart your testosterone , it is likely that your blood counts will become elevated again. Your elevated blood cells put you at increased risk of blood clots, heart, attack and stroke. Take aspirin 81 mg daily. Donate blood once every 3 months  FOLLOW-UP APPOINTMENT: Follow-up visit in 6 months  ** Thank you for trusting me with your healthcare!  I strive to provide all of my patients with quality care at each visit.  If you receive a survey for this visit, I would be so grateful to you for taking the time to provide feedback.  Thank you in advance!  ~ Nyiah Pianka                   Dr. Paulett Boros   &   Sheril Dines, PA-C   - - - - - - - - - - - - - - - - - -    Thank you for choosing [facility] to provide your oncology and hematology care.  To afford each patient quality time with our provider, please arrive at least 15 minutes before your scheduled appointment time.   If you have a lab  appointment with the [facility] please come in thru the Main Entrance and check in at the main information desk.  You need to re-schedule your appointment should you arrive 10 or more minutes late.  We strive to give you quality time with our providers, and arriving late affects you and other patients whose appointments are after yours.  Also, if you no show three or more times for appointments you may be dismissed from the clinic at the providers discretion.     Again, thank you for choosing [facility].  Our hope is that these requests will decrease the amount of time that you wait before being seen by our physicians.       _____________________________________________________________  Should you have questions after your visit to [facility], please contact our office at [phone] and follow the prompts.  Our office hours are 8:00 a.m. and 4:30 p.m. Monday - Friday.  Please note that voicemails left after 4:00 p.m. may not be returned until the following business day.  We are closed weekends and major holidays.  You do have access to a nurse 24-7, just call the main number to the clinic [phone] and do not press any options, hold on the line and a nurse will answer the phone.  For prescription refill requests, have your pharmacy contact our office and allow 72 hours.

## 2023-08-06 ENCOUNTER — Other Ambulatory Visit: Payer: Self-pay

## 2023-08-06 MED ORDER — PANTOPRAZOLE SODIUM 40 MG PO TBEC: 40.0000 mg | DELAYED_RELEASE_TABLET | Freq: Every day | ORAL | 1 refills | Status: DC

## 2023-09-12 IMAGING — MR MR CERVICAL SPINE W/O CM
4 of 5 series · 29 of 48 positions shown · non-contrast
Comparison: 01/05/2018

CLINICAL DATA: Neck pain, pain between the shoulder blades
radiating to the arms bilaterally.

EXAM:
MRI CERVICAL SPINE WITHOUT CONTRAST
TECHNIQUE: Multiplanar, multisequence MR imaging of the cervical spine was
performed. No intravenous contrast was administered.

[Series 3: T2 · sagittal · 3.0mm · 0.66mm/px · 8 of 18 slices shown (1 of 2)]
[im 1/18]
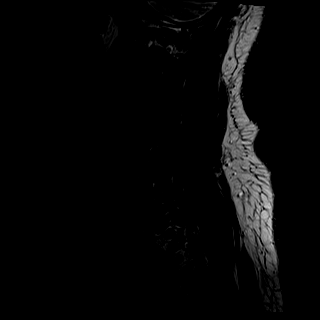
[im 3/18]
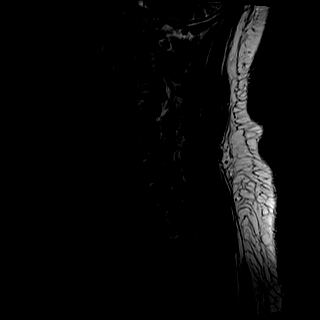
[im 5/18]
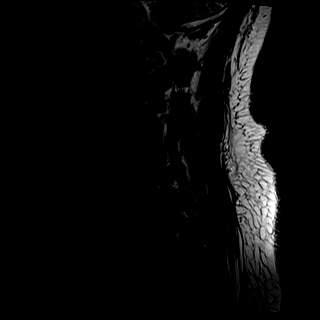
[im 8/18]
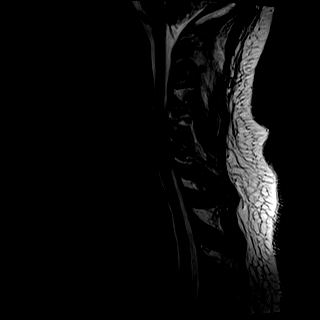
[im 10/18]
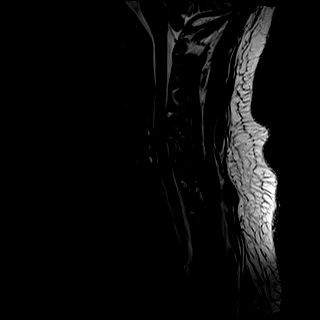
[im 13/18]
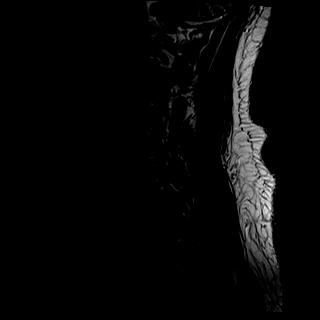
[im 15/18]
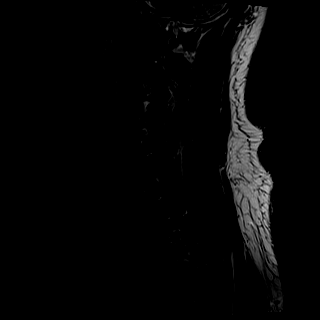
[im 18/18]
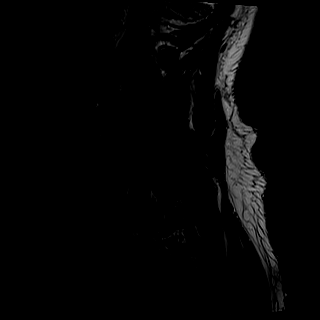

[Series 4: STIR · sagittal · 3.0mm · 0.41mm/px · 5 of 18 slices shown]
[im 1/18]
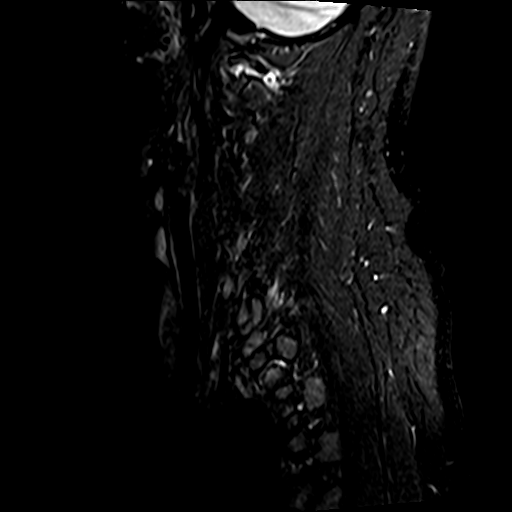
[im 3/18]
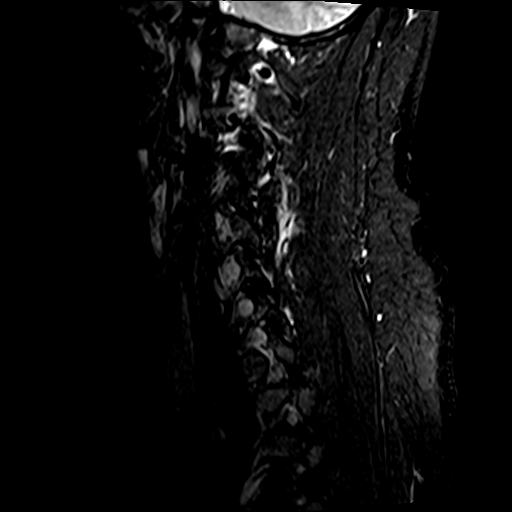
[im 6/18]
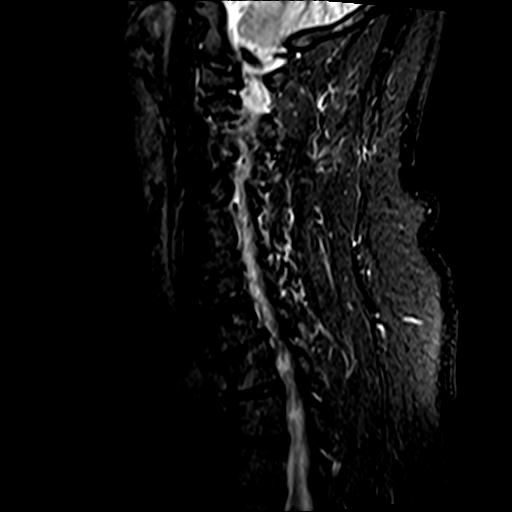
[im 9/18]
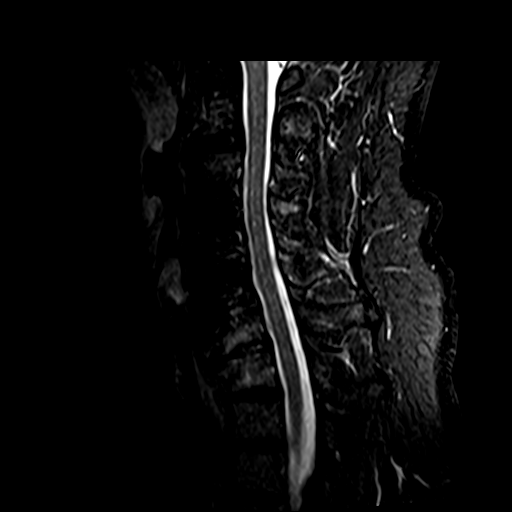
[im 15/18]
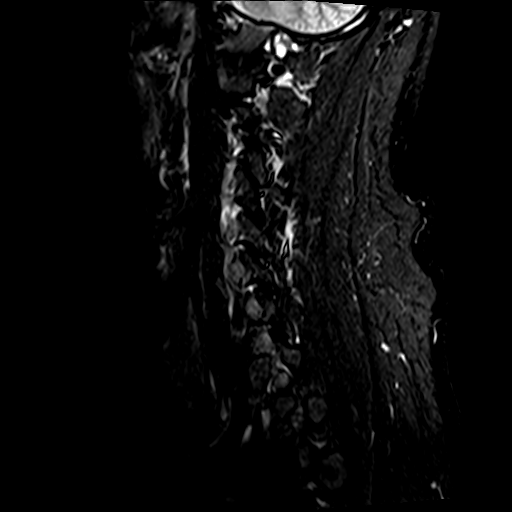

[Series 5: T1 · sagittal · 3.0mm · 0.41mm/px · 7 of 18 slices shown]
[im 1/18]
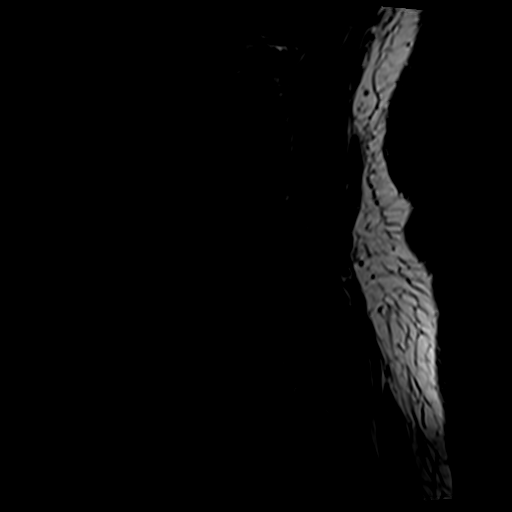
[im 3/18]
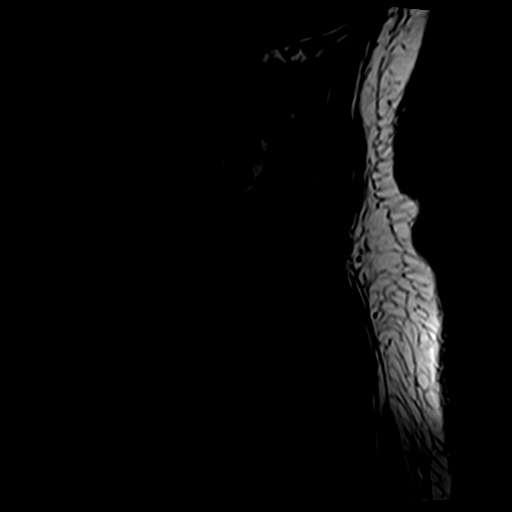
[im 6/18]
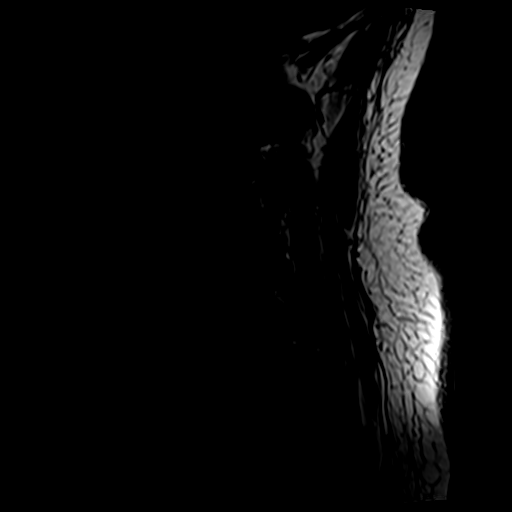
[im 9/18]
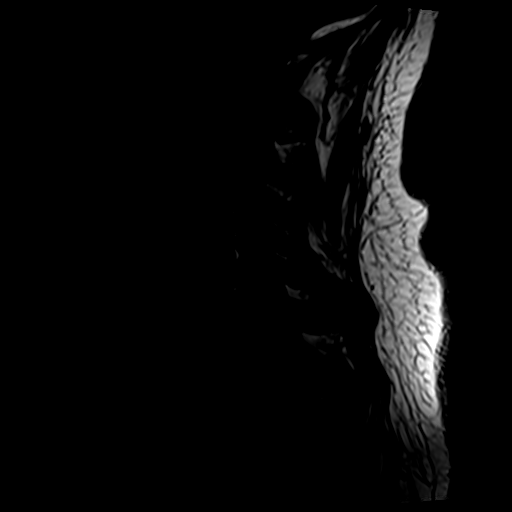
[im 12/18]
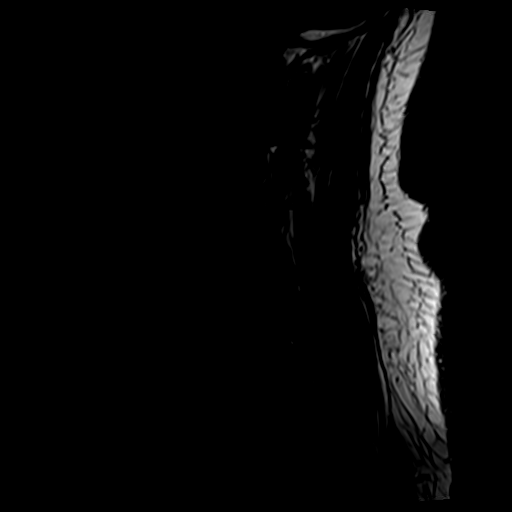
[im 15/18]
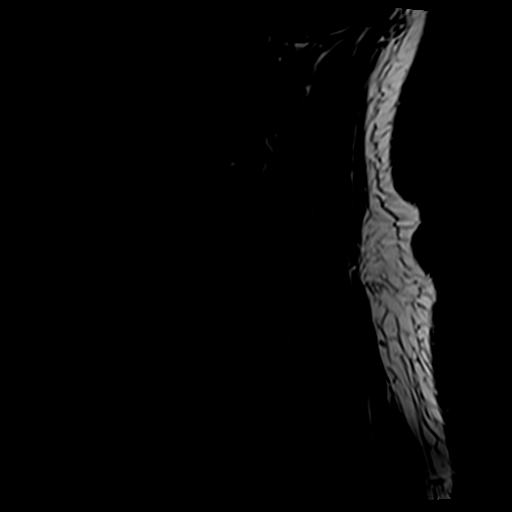
[im 18/18]
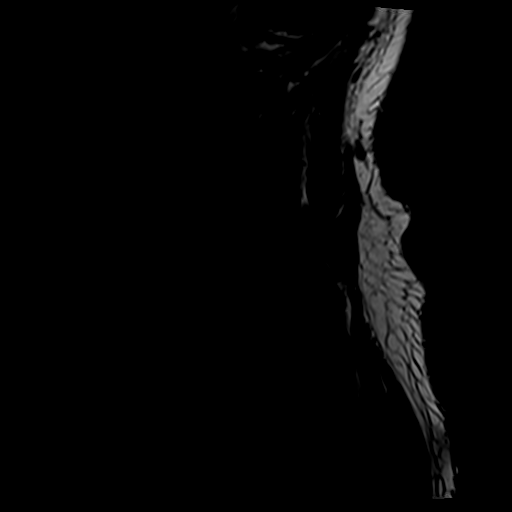

[Series 7: T2 · axial · 3.0mm · 0.70mm/px · z∈[-106,+14]mm · 9 of 34 slices shown (2 of 2)]
[im 1/34]
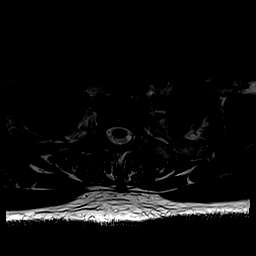
[im 6/34]
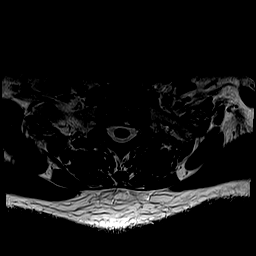
[im 12/34]
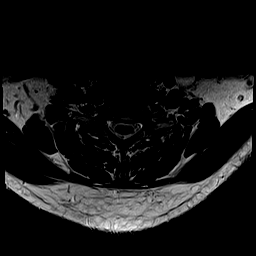
[im 14/34]
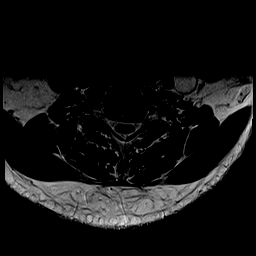
[im 17/34]
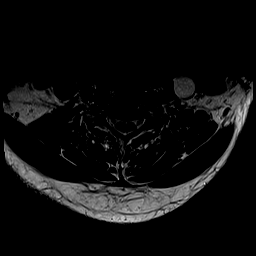
[im 20/34]
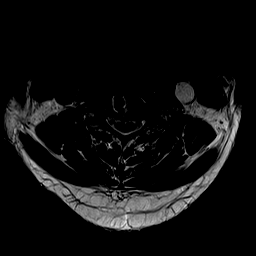
[im 23/34]
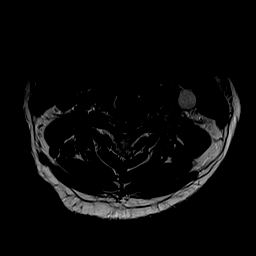
[im 28/34]
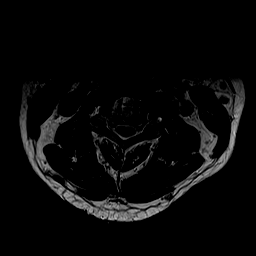
[im 34/34]
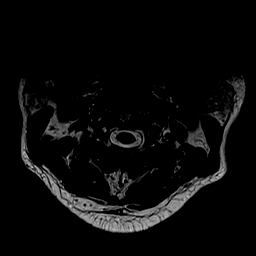

[29 of 48 positions shown; findings below may reference images not displayed]

FINDINGS: Alignment: Loss of the normal cervical lordosis with straightening.

Vertebrae: No acute fracture, evidence of discitis, or aggressive
bone lesion.

Cord: Normal signal and morphology.

Posterior Fossa, vertebral arteries, paraspinal tissues: Posterior
fossa demonstrates no focal abnormality. Vertebral artery flow voids
are maintained. Paraspinal soft tissues are unremarkable.

Disc levels:

Discs: Anterior cervical fusion from C3 through C6. Solid osseous
fusion across the disc spaces. Degenerative disease with disc height
loss at C6-7 and C7-T1. Disc desiccation at C2-3.

C2-3: No disc protrusion. Mild right foraminal stenosis. No left
foraminal stenosis. No central canal stenosis.

C3-4: Interbody fusion.  No foraminal or central canal stenosis.

C4-5: Interbody fusion.  No foraminal or central canal stenosis.

C5-6: Interbody fusion.  No foraminal or central canal stenosis.

C6-7: Mild broad-based disc bulge. Minimal bilateral foraminal
narrowing. No spinal stenosis.

C7-T1: Mild broad-based disc bulge. Bilateral uncovertebral
degenerative changes. Severe right foraminal stenosis. Moderate left
foraminal stenosis. No spinal stenosis.
IMPRESSION: 1. At C7-T1 there is a mild broad-based disc bulge. Bilateral
uncovertebral degenerative changes. Severe right foraminal stenosis.
Moderate left foraminal stenosis.
2. Anterior cervical fusion from C3 through C6 without foraminal or
central canal stenosis.
3. At C6-7 there is a mild broad-based disc bulge. Minimal bilateral
foraminal narrowing.

## 2023-09-23 ENCOUNTER — Other Ambulatory Visit: Payer: Self-pay | Admitting: Internal Medicine

## 2023-09-23 ENCOUNTER — Ambulatory Visit: Attending: Internal Medicine | Admitting: Internal Medicine

## 2023-09-23 ENCOUNTER — Telehealth: Payer: Self-pay | Admitting: Internal Medicine

## 2023-09-23 ENCOUNTER — Encounter: Payer: Self-pay | Admitting: Internal Medicine

## 2023-09-23 ENCOUNTER — Ambulatory Visit: Attending: Internal Medicine

## 2023-09-23 VITALS — BP 130/72 | HR 69 | Ht 68.0 in | Wt 184.4 lb

## 2023-09-23 DIAGNOSIS — R079 Chest pain, unspecified: Secondary | ICD-10-CM | POA: Diagnosis not present

## 2023-09-23 DIAGNOSIS — R002 Palpitations: Secondary | ICD-10-CM

## 2023-09-23 NOTE — Progress Notes (Deleted)
 Cardiology Office Note  Date: 09/23/2023   ID: Andrew Fisher, DOB Aug 02, 1955, MRN [id number]  PCP:  Andrew Medici, MD  Cardiologist:  None Electrophysiologist:  None   History of Present Illness: Andrew Fisher is a 68 y.o. male  Past Medical History:  Diagnosis Date   Arthritis    Asthma    GERD (gastroesophageal reflux disease)    Hearing loss    Hemorrhoids    Hyperlipidemia    Low testosterone  in male    Peripheral neuropathy    Sleep apnea    wears cpap   Sore throat    Wears glasses    Wears hearing aid in both ears     Past Surgical History:  Procedure Laterality Date   ANTERIOR CERVICAL DECOMP/DISCECTOMY FUSION N/A 01/09/2017   Procedure: ANTERIOR CERVICAL DECOMPRESSION FUSION, CERVICAL FOUR-FIVE, CERVICAL FIVE-SIX INSTRUMENTATION AND ALLOGRAFT;  Surgeon: Beuford Anes, MD;  [facility]: [facility];  Service: Orthopedics;  Laterality: N/A;  ANTERIOR CERVICAL DECOMPRESSION FUSION, CERVICAL FOUR-FIVE, CERVICAL FIVE-SIX INSTRUMENTATION AND ALLOGRAFT   APPENDECTOMY     BIOPSY  11/12/2019   Procedure: BIOPSY;  Surgeon: Rollin Dover, MD;  [facility]: [facility];  Service: Endoscopy;;   CARPAL TUNNEL RELEASE Left    06/2022   CARPAL TUNNEL RELEASE Right    10/2022   COLONOSCOPY     ESOPHAGOGASTRODUODENOSCOPY (EGD) WITH PROPOFOL  N/A 11/12/2019   Procedure: ESOPHAGOGASTRODUODENOSCOPY (EGD) WITH PROPOFOL ;  Surgeon: Rollin Dover, MD;  [facility];  Service: Endoscopy;  Laterality: N/A;   NECK SURGERY     SAVORY DILATION N/A 11/12/2019   Procedure: SAVORY DILATION;  Surgeon: Rollin Dover, MD;  [facility];  Service: Endoscopy;  Laterality: N/A;   SHOULDER SURGERY  2005   rotator cuff and tendons repaired    Current Outpatient Medications  Medication Sig Dispense Refill   acetaminophen  (TYLENOL ) 500 MG tablet Take by mouth.     albuterol  (VENTOLIN  HFA) 108 (90 Base) MCG/ACT inhaler USE 1 TO 2 INHALATIONS EVERY 6 HOURS AS NEEDED FOR  WHEEZING OR SHORTNESS OF BREATH (Patient taking differently: Inhale 2 puffs into the lungs every 6 (six) hours as needed for wheezing or shortness of breath.) 17 g 4   amLODipine (NORVASC) 10 MG tablet Take 10 mg by mouth daily.     aspirin EC 81 MG tablet Take 81 mg by mouth daily.     atorvastatin  (LIPITOR) 10 MG tablet TAKE 1 TABLET DAILY (Patient taking differently: Take 10 mg by mouth daily.) 90 tablet 3   benzonatate  (TESSALON ) 100 MG capsule Take 1 capsule (100 mg total) by mouth 2 (two) times daily as needed for cough. 20 capsule 0   Blood Glucose Monitoring Suppl KIT Use as directed to check blood sugars 1 time per day dx:e11.22 1 kit 1   Buprenorphine HCl 450 MCG FILM Take 300 mcg by mouth every 12 (twelve) hours.      Buprenorphine HCl 450 MCG FILM PLACE ONE FILM TO CHEEK AND GUM UNTIL DISSOLVED EVERY 12 HOURS     Carboxymethylcellulose Sod PF (REFRESH CELLUVISC) 1 % GEL Apply 1 drop to eye at bedtime.     Carboxymethylcellulose Sodium (REFRESH TEARS OP) Apply 1 drop to eye in the morning, at noon, in the evening, and at bedtime.     EPINEPHrine  0.3 mg/0.3 mL IJ SOAJ injection Inject 0.3 mg into the muscle as needed for anaphylaxis. 1 each 2   fluticasone  (FLONASE ) 50 MCG/ACT nasal spray Place 2 sprays into both nostrils  daily. 16 g 0   gabapentin  (NEURONTIN ) 300 MG capsule Take 300 mg by mouth 4 (four) times daily.     glucose blood test strip Use as directed to check blood sugars 1 time per day dx:e11.22 150 each 2   hydrocortisone  2.5 % cream SMARTSIG:1 Topical Daily     ibuprofen (ADVIL) 800 MG tablet Take by mouth.     IRON GLYCINATE PO Take 1 tablet by mouth daily.     Lancets 33G MISC Use as directed to check blood sugars 1 time per day dx:e11.22 150 each 2   levalbuterol (XOPENEX HFA) 45 MCG/ACT inhaler Inhale into the lungs.     LINZESS 145 MCG CAPS capsule Take 145 mcg by mouth daily as needed (constipation).      loratadine  (CLARITIN ) 10 MG tablet Take 10 mg by mouth daily.      MI-ACID GAS RELIEF 80 MG chewable tablet      montelukast  (SINGULAIR ) 10 MG tablet TAKE 1 TABLET DAILY (Patient taking differently: Take 10 mg by mouth daily.) 90 tablet 1   neomycin -polymyxin-hydrocortisone  (CORTISPORIN) 3.5-10000-1 OTIC suspension Place 4 drops into both ears 4 (four) times daily. X 7 days 10 mL 0   pantoprazole  (PROTONIX ) 40 MG tablet Take 1 tablet (40 mg total) by mouth daily. 90 tablet 1   sertraline (ZOLOFT) 100 MG tablet Take 100 mg by mouth daily.     tadalafil (CIALIS) 5 MG tablet Take 5 mg by mouth daily.      tamsulosin  (FLOMAX ) 0.4 MG CAPS capsule Take 0.4 mg by mouth at bedtime.     temazepam (RESTORIL) 15 MG capsule Take by mouth.     Testosterone  30 MG/ACT SOLN Apply 30 mg topically daily. Apply under each arm     tiZANidine (ZANAFLEX) 4 MG tablet Take 4 mg by mouth 3 (three) times daily.     traZODone (DESYREL) 50 MG tablet Take by mouth.     urea (CARMOL) 20 % cream Apply 1 application topically daily as needed (Feet pain).      No current facility-administered medications for this visit.   Allergies:  Patient has no known allergies.   Social History: The patient  reports that he has quit smoking. His smoking use included cigarettes. He has a 1.5 pack-year smoking history. He has never used smokeless tobacco. He reports that he does not currently use alcohol. He reports that he does not use drugs.   Family History: The patient's family history includes Cancer in an other family member; Diabetes in his mother; Healthy in his father; Hypertension in his mother.   ROS:  Please see the history of present illness. Otherwise, complete review of systems is positive for none  All other systems are reviewed and negative.   Physical Exam: VS:  There were no vitals taken for this visit., BMI There is no height or weight on file to calculate BMI.  Wt Readings from Last 3 Encounters:  07/23/23 188 lb 11.4 oz (85.6 kg)  07/02/23 190 lb 9.6 oz (86.5 kg)  01/22/23  202 lb (91.6 kg)    General: Patient appears comfortable at rest. HEENT: Conjunctiva and lids normal, oropharynx clear with moist mucosa. Neck: Supple, no elevated JVP or carotid bruits, no thyromegaly. Lungs: Clear to auscultation, nonlabored breathing at rest. Cardiac: Regular rate and rhythm, no S3 or significant systolic murmur, no pericardial rub. Abdomen: Soft, nontender, no hepatomegaly, bowel sounds present, no guarding or rebound. Extremities: No pitting edema, distal pulses 2+. Skin: Warm  and dry. Musculoskeletal: No kyphosis. Neuropsychiatric: Alert and oriented x3, affect grossly appropriate.  Recent Labwork: 01/01/2023: TSH 1.530 07/02/2023: ALT 22; AST 22; BUN 12; Creatinine, Ser 0.99; Hemoglobin 14.9; Platelets 138; Potassium 4.0; Sodium 142     Component Value Date/Time   CHOL 140 07/02/2023 1515   TRIG 102 07/02/2023 1515   HDL 36 (L) 07/02/2023 1515   CHOLHDL 3.9 07/02/2023 1515   LDLCALC 85 07/02/2023 1515    Other Studies Reviewed Today:   Assessment and Plan:         Medication Adjustments/Labs and Tests Ordered: Current medicines are reviewed at length with the patient today.  Concerns regarding medicines are outlined above.    Disposition:  Follow up {follow up:15908}  Signed Camielle Sizer Priya Scotlyn Mccranie, MD, 09/23/2023 1:07 PM    [facility] at [facility] [address] [place], [place], [place] [address]

## 2023-09-23 NOTE — Patient Instructions (Addendum)
 Medication Instructions:  Your physician recommends that you continue on your current medications as directed. Please refer to the Current Medication list given to you today.   Labwork: None  Testing/Procedures: Your physician has requested that you have a lexiscan myoview. For further information please visit [email or web address] Please follow instruction sheet, as given.  Your physician has recommended that you wear a Zio monitor.   This monitor is a medical device that records the heart's electrical activity. Doctors most often use these monitors to diagnose arrhythmias. Arrhythmias are problems with the speed or rhythm of the heartbeat. The monitor is a small device applied to your chest. You can wear one while you do your normal daily activities. While wearing this monitor if you have any symptoms to push the button and record what you felt. Once you have worn this monitor for the period of time provider prescribed (for 14 days), you will return the monitor device in the postage paid box. Once it is returned they will download the data collected and provide us  with a report which the provider will then review and we will call you with those results. Important tips:  Avoid showering during the first 24 hours of wearing the monitor. Avoid excessive sweating to help maximize wear time. Do not submerge the device, no hot tubs, and no swimming pools. Keep any lotions or oils away from the patch. After 24 hours you may shower with the patch on. Take brief showers with your back facing the shower head.  Do not remove patch once it has been placed because that will interrupt data and decrease adhesive wear time. Push the button when you have any symptoms and write down what you were feeling. Once you have completed wearing your monitor, remove and place into box which has postage paid and place in your outgoing mailbox.  If for some reason you have misplaced your box then call our office and we can  provide another box and/or mail it off for you.   Follow-Up: Your physician recommends that you schedule a follow-up appointment in: Pending Results  Any Other Special Instructions Will Be Listed Below (If Applicable). Thank you for choosing [facility]!     If you need a refill on your cardiac medications before your next appointment, please call your pharmacy.

## 2023-09-23 NOTE — Progress Notes (Signed)
 Cardiology Office Note  Date: 09/23/2023   ID: Andrew Fisher, DOB 1956-03-25, MRN [id number]  PCP:  Jarold Medici, MD  Cardiologist:  Diannah SHAUNNA Maywood, MD Electrophysiologist:  None   History of Present Illness: Andrew Fisher is a 68 y.o. male  Referred to cardiology clinic for evaluation of chest pain.  Echocardiogram in 2025 was unremarkable.  He has chest pains ongoing for the last 6 months.  He develops chest pains few times a week most commonly after he eats and resolves partially with belching.  Associated with bloating, palpitations etc.  Currently on PPI and Pepcid.  His chest pain improved with these medications but not resolved completely.  Does not have any other symptoms of syncope, DOE or leg swelling.  Past Medical History:  Diagnosis Date   Arthritis    Asthma    GERD (gastroesophageal reflux disease)    Hearing loss    Hemorrhoids    Hyperlipidemia    Low testosterone  in male    Peripheral neuropathy    Sleep apnea    wears cpap   Sore throat    Wears glasses    Wears hearing aid in both ears     Past Surgical History:  Procedure Laterality Date   ANTERIOR CERVICAL DECOMP/DISCECTOMY FUSION N/A 01/09/2017   Procedure: ANTERIOR CERVICAL DECOMPRESSION FUSION, CERVICAL FOUR-FIVE, CERVICAL FIVE-SIX INSTRUMENTATION AND ALLOGRAFT;  Surgeon: Beuford Anes, MD;  Location: [facility];  Service: Orthopedics;  Laterality: N/A;  ANTERIOR CERVICAL DECOMPRESSION FUSION, CERVICAL FOUR-FIVE, CERVICAL FIVE-SIX INSTRUMENTATION AND ALLOGRAFT   APPENDECTOMY     BIOPSY  11/12/2019   Procedure: BIOPSY;  Surgeon: Rollin Dover, MD;  [facility]: [facility];  Service: Endoscopy;;   CARPAL TUNNEL RELEASE Left    06/2022   CARPAL TUNNEL RELEASE Right    10/2022   COLONOSCOPY     ESOPHAGOGASTRODUODENOSCOPY (EGD) WITH PROPOFOL  N/A 11/12/2019   Procedure: ESOPHAGOGASTRODUODENOSCOPY (EGD) WITH PROPOFOL ;  Surgeon: Rollin Dover, MD;  [facility];  Service:  Endoscopy;  Laterality: N/A;   NECK SURGERY     SAVORY DILATION N/A 11/12/2019   Procedure: SAVORY DILATION;  Surgeon: Rollin Dover, MD;  [facility];  Service: Endoscopy;  Laterality: N/A;   SHOULDER SURGERY  2005   rotator cuff and tendons repaired    Current Outpatient Medications  Medication Sig Dispense Refill   acetaminophen  (TYLENOL ) 500 MG tablet Take by mouth.     albuterol  (VENTOLIN  HFA) 108 (90 Base) MCG/ACT inhaler USE 1 TO 2 INHALATIONS EVERY 6 HOURS AS NEEDED FOR WHEEZING OR SHORTNESS OF BREATH (Patient taking differently: Inhale 2 puffs into the lungs every 6 (six) hours as needed for wheezing or shortness of breath.) 17 g 4   amLODipine (NORVASC) 10 MG tablet Take 10 mg by mouth daily.     aspirin EC 81 MG tablet Take 81 mg by mouth daily.     atorvastatin  (LIPITOR) 10 MG tablet TAKE 1 TABLET DAILY (Patient taking differently: Take 10 mg by mouth daily.) 90 tablet 3   Blood Glucose Monitoring Suppl KIT Use as directed to check blood sugars 1 time per day dx:e11.22 1 kit 1   Buprenorphine HCl 450 MCG FILM Place 450 mcg inside cheek 2 (two) times daily.     capsaicin (ZOSTRIX) 0.025 % cream Apply topically as needed.     Carboxymethylcellulose Sod PF (REFRESH CELLUVISC) 1 % GEL Apply 1 drop to eye at bedtime.     Carboxymethylcellulose Sodium (REFRESH TEARS OP) Apply 1 drop  to eye in the morning, at noon, in the evening, and at bedtime.     EPINEPHrine  0.3 mg/0.3 mL IJ SOAJ injection Inject 0.3 mg into the muscle as needed for anaphylaxis. 1 each 2   famotidine (PEPCID) 10 MG tablet Take 10 mg by mouth at bedtime.     fluticasone  (FLONASE ) 50 MCG/ACT nasal spray Place 2 sprays into both nostrils daily. 16 g 0   gabapentin  (NEURONTIN ) 300 MG capsule Take 300 mg by mouth 4 (four) times daily.     glucose blood test strip Use as directed to check blood sugars 1 time per day dx:e11.22 150 each 2   hydrocortisone  2.5 % cream SMARTSIG:1 Topical Daily     ibuprofen  (ADVIL) 800 MG tablet Take by mouth.     IRON GLYCINATE PO Take 1 tablet by mouth daily.     Lancets 33G MISC Use as directed to check blood sugars 1 time per day dx:e11.22 150 each 2   levalbuterol (XOPENEX HFA) 45 MCG/ACT inhaler Inhale into the lungs.     LINZESS 145 MCG CAPS capsule Take 145 mcg by mouth daily as needed (constipation).      loratadine  (CLARITIN ) 10 MG tablet Take 10 mg by mouth daily.     meloxicam  (MOBIC ) 15 MG tablet Take 15 mg by mouth as needed.     methocarbamol (ROBAXIN) 500 MG tablet Take 500-1,000 mg by mouth every 6 (six) hours as needed.     MI-ACID GAS RELIEF 80 MG chewable tablet      montelukast  (SINGULAIR ) 10 MG tablet TAKE 1 TABLET DAILY (Patient taking differently: Take 10 mg by mouth daily.) 90 tablet 1   pantoprazole  (PROTONIX ) 40 MG tablet Take 1 tablet (40 mg total) by mouth daily. 90 tablet 1   sertraline (ZOLOFT) 100 MG tablet Take 100 mg by mouth daily.     tadalafil (CIALIS) 5 MG tablet Take 5 mg by mouth daily.      tamsulosin  (FLOMAX ) 0.4 MG CAPS capsule Take 0.4 mg by mouth at bedtime.     temazepam (RESTORIL) 15 MG capsule Take by mouth.     Testosterone  30 MG/ACT SOLN Apply 30 mg topically daily. Apply under each arm     tiZANidine (ZANAFLEX) 4 MG tablet Take 4 mg by mouth as needed.     traZODone (DESYREL) 150 MG tablet Take 150 mg by mouth as needed.     urea (CARMOL) 20 % cream Apply 1 application topically daily as needed (Feet pain).      No current facility-administered medications for this visit.   Allergies:  Patient has no known allergies.   Social History: The patient  reports that he has quit smoking. His smoking use included cigarettes. He has a 1.5 pack-year smoking history. He has never used smokeless tobacco. He reports that he does not currently use alcohol. He reports that he does not use drugs.   Family History: The patient's family history includes Cancer in an other family member; Diabetes in his mother; Healthy in his  father; Hypertension in his mother.   ROS:  Please see the history of present illness. Otherwise, complete review of systems is positive for none  All other systems are reviewed and negative.   Physical Exam: VS:  BP 130/72   Pulse 69   Ht 5' 8 (1.727 m)   Wt 184 lb 6.4 oz (83.6 kg)   SpO2 98%   BMI 28.04 kg/m , BMI Body mass index is 28.04 kg/m.  Wt Readings from Last 3 Encounters:  09/23/23 184 lb 6.4 oz (83.6 kg)  07/23/23 188 lb 11.4 oz (85.6 kg)  07/02/23 190 lb 9.6 oz (86.5 kg)    General: Patient appears comfortable at rest. HEENT: Conjunctiva and lids normal, oropharynx clear with moist mucosa. Neck: Supple, no elevated JVP or carotid bruits, no thyromegaly. Lungs: Clear to auscultation, nonlabored breathing at rest. Cardiac: Regular rate and rhythm, no S3 or significant systolic murmur, no pericardial rub. Abdomen: Soft, nontender, no hepatomegaly, bowel sounds present, no guarding or rebound. Extremities: No pitting edema, distal pulses 2+. Skin: Warm and dry. Musculoskeletal: No kyphosis. Neuropsychiatric: Alert and oriented x3, affect grossly appropriate.  Recent Labwork: 01/01/2023: TSH 1.530 07/02/2023: ALT 22; AST 22; BUN 12; Creatinine, Ser 0.99; Hemoglobin 14.9; Platelets 138; Potassium 4.0; Sodium 142     Component Value Date/Time   CHOL 140 07/02/2023 1515   TRIG 102 07/02/2023 1515   HDL 36 (L) 07/02/2023 1515   CHOLHDL 3.9 07/02/2023 1515   LDLCALC 85 07/02/2023 1515    Other Studies Reviewed Today:   Assessment and Plan:  Chest pain: Ongoing chest pain for the last 6 months.  He has chest pain few times per week followed by chest pain free interval with recurrence of chest pains again.  It occurs most commonly after he eats anything.  Associated with palpitations, bloating, belching etc.  He has a history of GERD.  Currently on PPI and Pepcid.  He noticed improvement in his symptoms after he was started on these medications but did not resolve  completely.  Drinks Pepsi and sodas daily.  Strongly encouraged to quit.  His chest pain is likely GI related.  Will need to rule out CAD due to advanced age.  Obtain Lexiscan.  Echocardiogram was unremarkable.  Palpitations: Palpitations occur in relation to chest pains most commonly and does not last long.  Obtain 2-week event monitor, NL.       Medication Adjustments/Labs and Tests Ordered: Current medicines are reviewed at length with the patient today.  Concerns regarding medicines are outlined above.    Disposition:  Follow up pending results  Signed Rober Skeels Priya Zakir Henner, MD, 09/23/2023 2:03 PM    [facility] at [facility] [address] [place], [place], [place] [address]

## 2023-09-23 NOTE — Telephone Encounter (Signed)
 Checking percert on the following patient for testing scheduled at [facility]    LEXISCAN  10/09/2023  LONG TERM MONITOR (3-14 DAYS) 14 DAY

## 2023-10-09 ENCOUNTER — Ambulatory Visit (HOSPITAL_COMMUNITY)
Admission: RE | Admit: 2023-10-09 | Discharge: 2023-10-09 | Disposition: A | Discharge status: Home / Self Care | Source: Ambulatory Visit | Attending: Internal Medicine | Admitting: Internal Medicine

## 2023-10-09 ENCOUNTER — Other Ambulatory Visit: Payer: Self-pay | Admitting: Physician Assistant

## 2023-10-09 DIAGNOSIS — R079 Chest pain, unspecified: Secondary | ICD-10-CM | POA: Insufficient documentation

## 2023-10-09 LAB — NM MYOCAR MULTI W/SPECT W/WALL MOTION / EF
Base ST Depression (mm): 0 mm
LV dias vol: 98 mL (ref 62–150)
LV sys vol: 39 mL (ref 4.2–5.8)
MPHR: 152 {beats}/min
Nuc Stress EF: 60 %
Peak HR: 70 {beats}/min
Percent HR: 46 %
RATE: 0.4
Rest HR: 49 {beats}/min
Rest Nuclear Isotope Dose: 10.5 mCi
SDS: 6
SRS: 0
SSS: 6
ST Depression (mm): 0 mm
Stress Nuclear Isotope Dose: 32.7 mCi
TID: 1.41

## 2023-10-09 MED ORDER — TECHNETIUM TC 99M TETROFOSMIN IV KIT
30.0000 | PACK | Freq: Once | INTRAVENOUS | Status: AC | PRN
  Administered 2023-10-09: 32.7 via INTRAVENOUS

## 2023-10-09 MED ORDER — TECHNETIUM TC 99M TETROFOSMIN IV KIT
10.5000 | PACK | Freq: Once | INTRAVENOUS | Status: AC | PRN
  Administered 2023-10-09: 10.5 via INTRAVENOUS

## 2023-10-09 MED ORDER — REGADENOSON 0.4 MG/5ML IV SOLN
INTRAVENOUS | Status: AC
  Administered 2023-10-09: 0.4 mg via INTRAVENOUS
  Filled 2023-10-09: qty 5

## 2023-10-09 MED ORDER — SODIUM CHLORIDE FLUSH 0.9 % IV SOLN
INTRAVENOUS | Status: AC
Start: 2023-10-09 — End: 2023-10-09
  Administered 2023-10-09: 10 mL via INTRAVENOUS
  Filled 2023-10-09: qty 10

## 2023-10-09 NOTE — Progress Notes (Signed)
     Andrew Fisher presented for a Lexiscan  nuclear stress test today.  I Lorette CINDERELLA Kapur, PA-C, provided direct supervision and was present during the stress portion of the study today, which was completed without significant symptoms, immediate complications, or acute ST/T changes on ECG.  Stress imaging is pending at this time.  Preliminary ECG findings may be listed in the chart, but the stress test result will not be finalized until perfusion imaging is complete.  Lorette CINDERELLA Kapur, PA-C  10/09/2023, 10:15 AM

## 2023-10-14 ENCOUNTER — Ambulatory Visit: Payer: Self-pay | Admitting: Internal Medicine

## 2023-10-14 DIAGNOSIS — I2510 Atherosclerotic heart disease of native coronary artery without angina pectoris: Secondary | ICD-10-CM

## 2023-10-14 DIAGNOSIS — Z136 Encounter for screening for cardiovascular disorders: Secondary | ICD-10-CM

## 2023-10-14 MED ORDER — METOPROLOL TARTRATE 100 MG PO TABS: 100.0000 mg | ORAL_TABLET | Freq: Once | ORAL | 0 refills | Status: DC

## 2023-10-14 NOTE — Telephone Encounter (Signed)
-----   Message from Vishnu P Mallipeddi sent at 10/14/2023  2:54 PM EDT ----- No ischemia but TID score is elevated, need to r/o multivessel CAD or LM CAD. Obtain CT cardiac. ----- Message ----- From: Okey Vina GAILS, MD Sent: 10/09/2023   1:07 PM EDT To: Vishnu P Mallipeddi, MD

## 2023-10-14 NOTE — Telephone Encounter (Signed)
 Advised patient that Coronary CTA has been ordered and that our schedulers will reach out to him to give him a time and date. Also let him know that I will send him MyChart message with instructions. Provided Stress Test results and PCP copied.

## 2023-10-17 ENCOUNTER — Ambulatory Visit (HOSPITAL_COMMUNITY): Admission: RE | Admit: 2023-10-17 | Source: Ambulatory Visit

## 2023-10-22 ENCOUNTER — Telehealth (HOSPITAL_COMMUNITY): Payer: Self-pay | Admitting: Emergency Medicine

## 2023-10-22 NOTE — Telephone Encounter (Signed)
 Reaching out to patient to offer assistance regarding upcoming cardiac imaging study; pt verbalizes understanding of appt date/time, parking situation and where to check in, pre-test NPO status and medications ordered, and verified current allergies; name and call back number provided for further questions should they arise Rockwell Alexandria RN Navigator Cardiac Imaging [facility] [phone] office [phone] cell

## 2023-10-23 ENCOUNTER — Ambulatory Visit (HOSPITAL_COMMUNITY)
Admission: RE | Admit: 2023-10-23 | Discharge: 2023-10-23 | Disposition: A | Discharge status: Home / Self Care | Source: Ambulatory Visit | Attending: Internal Medicine | Admitting: Internal Medicine

## 2023-10-23 DIAGNOSIS — Z136 Encounter for screening for cardiovascular disorders: Secondary | ICD-10-CM | POA: Insufficient documentation

## 2023-10-23 DIAGNOSIS — I2510 Atherosclerotic heart disease of native coronary artery without angina pectoris: Secondary | ICD-10-CM | POA: Diagnosis not present

## 2023-10-23 MED ORDER — NITROGLYCERIN 0.4 MG SL SUBL: 0.8000 mg | SUBLINGUAL_TABLET | Freq: Once | SUBLINGUAL | Status: DC

## 2023-10-23 MED ORDER — IOHEXOL 350 MG/ML SOLN
100.0000 mL | Freq: Once | INTRAVENOUS | Status: AC | PRN
  Administered 2023-10-23: 100 mL via INTRAVENOUS

## 2023-10-29 ENCOUNTER — Ambulatory Visit (HOSPITAL_BASED_OUTPATIENT_CLINIC_OR_DEPARTMENT_OTHER)
Admission: RE | Admit: 2023-10-29 | Discharge: 2023-10-29 | Disposition: A | Discharge status: Home / Self Care | Source: Ambulatory Visit | Attending: Cardiovascular Disease | Admitting: Cardiovascular Disease

## 2023-10-29 DIAGNOSIS — Z136 Encounter for screening for cardiovascular disorders: Secondary | ICD-10-CM | POA: Diagnosis not present

## 2023-10-29 DIAGNOSIS — I2510 Atherosclerotic heart disease of native coronary artery without angina pectoris: Secondary | ICD-10-CM

## 2023-10-29 MED ORDER — NITROGLYCERIN 0.4 MG SL SUBL
SUBLINGUAL_TABLET | SUBLINGUAL | Status: AC
  Filled 2023-10-29: qty 2

## 2023-10-29 MED ORDER — IOHEXOL 350 MG/ML SOLN
100.0000 mL | Freq: Once | INTRAVENOUS | Status: AC | PRN
  Administered 2023-10-29: 100 mL via INTRAVENOUS

## 2023-10-29 MED ORDER — NITROGLYCERIN 0.4 MG SL SUBL
0.8000 mg | SUBLINGUAL_TABLET | Freq: Once | SUBLINGUAL | Status: AC
  Administered 2023-10-29: 0.8 mg via SUBLINGUAL

## 2023-10-30 ENCOUNTER — Ambulatory Visit: Payer: Self-pay | Admitting: Internal Medicine

## 2023-11-06 NOTE — Telephone Encounter (Signed)
 The patient has been notified of the result and verbalized understanding.  All questions (if any) were answered. Littie CHRISTELLA Croak, CMA 11/06/2023 4:38 PM

## 2023-11-06 NOTE — Telephone Encounter (Signed)
-----   Message from Vishnu P Mallipeddi sent at 10/30/2023  4:05 PM EDT ----- Coronary calcium  score is 122 (70th percentile for age and sex matched control), moderate total plaque volume,mild CAD and small PFO (no intervention required).  Chest pain likely noncardiac.  False  positive stress test.  Continue statin with a goal DL less than 899.  Schedule follow-up as needed. ----- Message ----- From: Interface, Rad Results In Sent: 10/30/2023  11:19 AM EDT To: Vishnu P Mallipeddi, MD

## 2023-11-07 ENCOUNTER — Other Ambulatory Visit: Payer: Self-pay | Admitting: Internal Medicine

## 2023-11-18 DIAGNOSIS — R002 Palpitations: Secondary | ICD-10-CM

## 2023-12-05 ENCOUNTER — Ambulatory Visit: Payer: Self-pay | Admitting: Internal Medicine

## 2024-01-07 ENCOUNTER — Encounter: Payer: Self-pay | Admitting: Internal Medicine

## 2024-01-07 ENCOUNTER — Ambulatory Visit: Payer: Self-pay | Admitting: Internal Medicine

## 2024-01-07 ENCOUNTER — Ambulatory Visit: Payer: Medicare Other

## 2024-01-07 VITALS — BP 126/66 | HR 66 | Temp 98.5°F | Ht 68.0 in | Wt 182.4 lb

## 2024-01-07 VITALS — BP 126/66 | HR 66 | Temp 98.5°F | Ht 68.0 in | Wt 182.0 lb

## 2024-01-07 DIAGNOSIS — I119 Hypertensive heart disease without heart failure: Secondary | ICD-10-CM | POA: Diagnosis not present

## 2024-01-07 DIAGNOSIS — E291 Testicular hypofunction: Secondary | ICD-10-CM | POA: Diagnosis not present

## 2024-01-07 DIAGNOSIS — Z0000 Encounter for general adult medical examination without abnormal findings: Secondary | ICD-10-CM

## 2024-01-07 DIAGNOSIS — I25118 Atherosclerotic heart disease of native coronary artery with other forms of angina pectoris: Secondary | ICD-10-CM

## 2024-01-07 DIAGNOSIS — G8929 Other chronic pain: Secondary | ICD-10-CM

## 2024-01-07 DIAGNOSIS — Z23 Encounter for immunization: Secondary | ICD-10-CM | POA: Diagnosis not present

## 2024-01-07 DIAGNOSIS — E7800 Pure hypercholesterolemia, unspecified: Secondary | ICD-10-CM

## 2024-01-07 DIAGNOSIS — I2510 Atherosclerotic heart disease of native coronary artery without angina pectoris: Secondary | ICD-10-CM | POA: Insufficient documentation

## 2024-01-07 DIAGNOSIS — F5104 Psychophysiologic insomnia: Secondary | ICD-10-CM

## 2024-01-07 DIAGNOSIS — D696 Thrombocytopenia, unspecified: Secondary | ICD-10-CM

## 2024-01-07 DIAGNOSIS — D46Z Other myelodysplastic syndromes: Secondary | ICD-10-CM | POA: Insufficient documentation

## 2024-01-07 DIAGNOSIS — M545 Low back pain, unspecified: Secondary | ICD-10-CM

## 2024-01-07 NOTE — Patient Instructions (Addendum)
 Mr. Uphoff,  Thank you for taking the time for your Medicare Wellness Visit. I appreciate your continued commitment to your health goals. Please review the care plan we discussed, and feel free to reach out if I can assist you further.  Medicare recommends these wellness visits once per year to help you and your care team stay ahead of potential health issues. These visits are designed to focus on prevention, allowing your provider to concentrate on managing your acute and chronic conditions during your regular appointments.  Please note that Annual Wellness Visits do not include a physical exam. Some assessments may be limited, especially if the visit was conducted virtually. If needed, we may recommend a separate in-person follow-up with your provider.  Ongoing Care Seeing your primary care provider every 3 to 6 months helps us  monitor your health and provide consistent, personalized care.   Referrals If a referral was made during today's visit and you haven't received any updates within two weeks, please contact the referred provider directly to check on the status.  Recommended Screenings:  Health Maintenance  Topic Date Due   COVID-19 Vaccine (6 - 2025-26 season) 11/24/2023   Medicare Annual Wellness Visit  01/06/2025   Colon Cancer Screening  08/17/2028   DTaP/Tdap/Td vaccine (2 - Td or Tdap) 12/12/2029   Pneumococcal Vaccine for age over 71  Completed   Flu Shot  Completed   Hepatitis C Screening  Completed   Zoster (Shingles) Vaccine  Completed   Meningitis B Vaccine  Aged Out       01/07/2024    2:22 PM  Advanced Directives  Does Patient Have a Medical Advance Directive? No   Advance Care Planning is important because it: Ensures you receive medical care that aligns with your values, goals, and preferences. Provides guidance to your family and loved ones, reducing the emotional burden of decision-making during critical moments.  Vision: Annual vision screenings are  recommended for early detection of glaucoma, cataracts, and diabetic retinopathy. These exams can also reveal signs of chronic conditions such as diabetes and high blood pressure.  Dental: Annual dental screenings help detect early signs of oral cancer, gum disease, and other conditions linked to overall health, including heart disease and diabetes.  Please see the attached documents for additional preventive care recommendations.

## 2024-01-07 NOTE — Progress Notes (Signed)
 I,Victoria T Emmitt, CMA,acting as a Neurosurgeon for Catheryn LOISE Slocumb, MD.,have documented all relevant documentation on the behalf of Catheryn LOISE Slocumb, MD,as directed by  Catheryn LOISE Slocumb, MD while in the presence of Catheryn LOISE Slocumb, MD.  Subjective:  Patient ID: Andrew Fisher , male    DOB: [birth year] , 68 y.o.   MRN: [id number]  Chief Complaint  Patient presents with   Hypertension    Pt presents today for bp & chol check.  He is also followed by the [facility] for his chronic conditions. Most of his specialists are now at the [facility]. He denies headaches, chest pain and shortness of breath. AWV completed with [facility] advisor: Jiles.    Hyperlipidemia    He presents today for bp/chol check. He is also followed by the [facility]. Reports compliance with meds.   Hyperlipidemia This is a chronic problem. The current episode started more than 1 year ago. The problem is controlled. He has no history of liver disease. Associated symptoms include chest pain. Pertinent negatives include no shortness of breath. Current antihyperlipidemic treatment includes statins.  Hypertension This is a chronic problem. The current episode started more than 1 year ago. The problem has been gradually improving since onset. The problem is controlled. Associated symptoms include chest pain. Pertinent negatives include no anxiety, neck pain, orthopnea or shortness of breath. Past treatments include calcium  channel blockers. The current treatment provides moderate improvement.     Past Medical History:  Diagnosis Date   Arthritis    Asthma    GERD (gastroesophageal reflux disease)    Hearing loss    Hemorrhoids    Hyperlipidemia    Low testosterone  in male    Peripheral neuropathy    Sleep apnea    wears cpap   Sore throat    Wears glasses    Wears hearing aid in both ears      Family History  Problem Relation Age of Onset   Diabetes Mother    Hypertension Mother    Healthy Father    Cancer Other      Current Outpatient  Medications:    acetaminophen  (TYLENOL ) 500 MG tablet, Take by mouth., Disp: , Rfl:    albuterol  (VENTOLIN  HFA) 108 (90 Base) MCG/ACT inhaler, USE 1 TO 2 INHALATIONS EVERY 6 HOURS AS NEEDED FOR WHEEZING OR SHORTNESS OF BREATH (Patient taking differently: Inhale 2 puffs into the lungs every 6 (six) hours as needed for wheezing or shortness of breath.), Disp: 17 g, Rfl: 4   amLODipine (NORVASC) 10 MG tablet, Take 10 mg by mouth daily., Disp: , Rfl:    aspirin EC 81 MG tablet, Take 81 mg by mouth daily., Disp: , Rfl:    atorvastatin  (LIPITOR) 10 MG tablet, TAKE 1 TABLET DAILY (Patient taking differently: Take 10 mg by mouth daily.), Disp: 90 tablet, Rfl: 3   Blood Glucose Monitoring Suppl KIT, Use as directed to check blood sugars 1 time per day dx:e11.22, Disp: 1 kit, Rfl: 1   Buprenorphine HCl 450 MCG FILM, Place 450 mcg inside cheek 2 (two) times daily., Disp: , Rfl:    capsaicin (ZOSTRIX) 0.025 % cream, Apply topically as needed., Disp: , Rfl:    Carboxymethylcellulose Sod PF (REFRESH CELLUVISC) 1 % GEL, Apply 1 drop to eye at bedtime., Disp: , Rfl:    Carboxymethylcellulose Sodium (REFRESH TEARS OP), Apply 1 drop to eye in the morning, at noon, in the evening, and at bedtime., Disp: , Rfl:    EPINEPHrine  0.3  mg/0.3 mL IJ SOAJ injection, Inject 0.3 mg into the muscle as needed for anaphylaxis., Disp: 1 each, Rfl: 2   famotidine (PEPCID) 10 MG tablet, Take 10 mg by mouth at bedtime., Disp: , Rfl:    fluticasone  (FLONASE ) 50 MCG/ACT nasal spray, Place 2 sprays into both nostrils daily., Disp: 16 g, Rfl: 0   gabapentin  (NEURONTIN ) 300 MG capsule, Take 300 mg by mouth 4 (four) times daily., Disp: , Rfl:    glucose blood test strip, Use as directed to check blood sugars 1 time per day dx:e11.22, Disp: 150 each, Rfl: 2   hydrocortisone  2.5 % cream, SMARTSIG:1 Topical Daily, Disp: , Rfl:    ibuprofen (ADVIL) 800 MG tablet, Take by mouth., Disp: , Rfl:    IRON GLYCINATE PO, Take 1 tablet by mouth daily.  (Patient not taking: Reported on 01/07/2024), Disp: , Rfl:    Lancets 33G MISC, Use as directed to check blood sugars 1 time per day dx:e11.22, Disp: 150 each, Rfl: 2   levalbuterol (XOPENEX HFA) 45 MCG/ACT inhaler, Inhale into the lungs., Disp: , Rfl:    LINZESS 145 MCG CAPS capsule, Take 145 mcg by mouth daily as needed (constipation). , Disp: , Rfl:    loratadine  (CLARITIN ) 10 MG tablet, Take 10 mg by mouth daily., Disp: , Rfl:    meloxicam  (MOBIC ) 15 MG tablet, Take 15 mg by mouth as needed., Disp: , Rfl:    methocarbamol (ROBAXIN) 500 MG tablet, Take 500-1,000 mg by mouth every 6 (six) hours as needed., Disp: , Rfl:    MI-ACID GAS RELIEF 80 MG chewable tablet, , Disp: , Rfl:    montelukast  (SINGULAIR ) 10 MG tablet, TAKE 1 TABLET DAILY (Patient taking differently: Take 10 mg by mouth daily.), Disp: 90 tablet, Rfl: 1   pantoprazole  (PROTONIX ) 40 MG tablet, TAKE 1 TABLET(40 MG) BY MOUTH DAILY, Disp: 90 tablet, Rfl: 1   sertraline (ZOLOFT) 100 MG tablet, Take 100 mg by mouth daily., Disp: , Rfl:    tadalafil (CIALIS) 5 MG tablet, Take 5 mg by mouth daily. , Disp: , Rfl:    tamsulosin  (FLOMAX ) 0.4 MG CAPS capsule, Take 0.4 mg by mouth at bedtime., Disp: , Rfl:    temazepam (RESTORIL) 15 MG capsule, Take by mouth. (Patient not taking: Reported on 01/07/2024), Disp: , Rfl:    Testosterone  30 MG/ACT SOLN, Apply 30 mg topically daily. Apply under each arm, Disp: , Rfl:    traZODone (DESYREL) 150 MG tablet, Take 150 mg by mouth as needed. (Patient not taking: Reported on 01/07/2024), Disp: , Rfl:    urea (CARMOL) 20 % cream, Apply 1 application topically daily as needed (Feet pain). , Disp: , Rfl:    No Known Allergies   Review of Systems  Constitutional: Negative.   HENT: Negative.    Respiratory: Negative.  Negative for shortness of breath.   Cardiovascular:  Positive for chest pain. Negative for orthopnea.  Gastrointestinal: Negative.   Genitourinary: Negative.   Musculoskeletal:  Negative  for neck pain.  Skin: Negative.   Allergic/Immunologic: Negative.   Neurological: Negative.      Today's Vitals   01/07/24 1415  BP: 126/66  Pulse: 66  Temp: 98.5 F (36.9 C)  SpO2: 98%  Weight: 182 lb (82.6 kg)  Height: 5' 8 (1.727 m)   Body mass index is 27.67 kg/m.  Wt Readings from Last 3 Encounters:  01/07/24 182 lb (82.6 kg)  01/07/24 182 lb 6.4 oz (82.7 kg)  09/23/23 184 lb 6.4  oz (83.6 kg)     Objective:  Physical Exam Vitals and nursing note reviewed.  Constitutional:      Appearance: Normal appearance.  HENT:     Head: Normocephalic and atraumatic.  Eyes:     Extraocular Movements: Extraocular movements intact.  Cardiovascular:     Rate and Rhythm: Normal rate and regular rhythm.     Heart sounds: Normal heart sounds.  Pulmonary:     Effort: Pulmonary effort is normal.     Breath sounds: Normal breath sounds.  Musculoskeletal:     Cervical back: Normal range of motion.  Skin:    General: Skin is warm.  Neurological:     General: No focal deficit present.     Mental Status: He is alert.  Psychiatric:        Mood and Affect: Mood normal.         Assessment And Plan:  Hypertensive heart disease without heart failure Assessment & Plan: Chronic, well controlled.  -Managed with amlodipine 10 mg daily. - Follow low sodium diet.    Orders: -     CMP14+EGFR -     Lipid panel -     Microalbumin / creatinine urine ratio  Coronary artery disease of native artery of native heart with stable angina pectoris Assessment & Plan: Chronic, calcium  score 122. Cardiology input is appreciated. Therefore, LDL goal is less than 70.  - Continue with ASA 81mg  and atorvastatin  10mg  daily.  - Follow heart healthy lifestyle.   Orders: -     Lipid panel  Pure hypercholesterolemia Assessment & Plan: Chronic, currently on statin therapy. LDL goal is less than 70, given underlying CAD.  - Optimal LDL goal is less than 55.    Orders: -     Lipid  panel  Hypogonadism in male -     Testosterone ,Free and Total  Chronic bilateral low back pain without sciatica Assessment & Plan: Chronic back pain managed with buprenorphine and gabapentin . [facility] with [facility] nerve [facility] for foot pain. - Continue current pain management regimen.     Return for 6 month bpc. 1year ov w rs & thn.  Patient was given opportunity to ask questions. Patient verbalized understanding of the plan and was able to repeat key elements of the plan. All questions were answered to their satisfaction.   I, Catheryn LOISE Slocumb, MD, have reviewed all documentation for this visit. The documentation on 01/07/24 for the exam, diagnosis, procedures, and orders are all accurate and complete.   IF YOU HAVE BEEN REFERRED TO A SPECIALIST, IT MAY TAKE 1-2 WEEKS TO SCHEDULE/PROCESS THE REFERRAL. IF YOU HAVE NOT HEARD FROM US /SPECIALIST IN TWO WEEKS, PLEASE GIVE US  A CALL AT [phone] X [phone].   THE PATIENT IS ENCOURAGED TO PRACTICE SOCIAL DISTANCING DUE TO THE COVID-19 PANDEMIC.

## 2024-01-07 NOTE — Progress Notes (Signed)
 Subjective:   Andrew Fisher is a 68 y.o. who presents for a Medicare Wellness preventive visit.  As a reminder, Annual Wellness Visits don't include a physical exam, and some assessments may be limited, especially if this visit is performed virtually. We may recommend an in-person follow-up visit with your provider if needed.  Visit Complete: In person    Persons Participating in Visit: Patient.  AWV Questionnaire: No: Patient Medicare AWV questionnaire was not completed prior to this visit.  Cardiac Risk Factors include: advanced age (>72men, >84 women);male gender     Objective:    Today's Vitals   01/07/24 1409  BP: 126/66  Pulse: 66  Temp: 98.5 F (36.9 C)  TempSrc: Oral  SpO2: 95%  Weight: 182 lb 6.4 oz (82.7 kg)  Height: 5' 8 (1.727 m)   Body mass index is 27.73 kg/m.     01/07/2024    2:22 PM 07/23/2023    8:45 AM 01/22/2023    1:52 PM 01/01/2023    2:24 PM 10/09/2022    1:13 PM 07/04/2022    1:54 PM 02/26/2022   10:17 AM  Advanced Directives  Does Patient Have a Medical Advance Directive? No Yes;No No No No No No  Does patient want to make changes to medical advance directive?  No - Patient declined       Would patient like information on creating a medical advance directive?  No - Patient declined No - Patient declined No - Patient declined No - Patient declined No - Patient declined No - Patient declined    Current Medications (verified) Outpatient Encounter Medications as of 01/07/2024  Medication Sig   acetaminophen  (TYLENOL ) 500 MG tablet Take by mouth.   albuterol  (VENTOLIN  HFA) 108 (90 Base) MCG/ACT inhaler USE 1 TO 2 INHALATIONS EVERY 6 HOURS AS NEEDED FOR WHEEZING OR SHORTNESS OF BREATH (Patient taking differently: Inhale 2 puffs into the lungs every 6 (six) hours as needed for wheezing or shortness of breath.)   amLODipine (NORVASC) 10 MG tablet Take 10 mg by mouth daily.   aspirin EC 81 MG tablet Take 81 mg by mouth daily.   atorvastatin   (LIPITOR) 10 MG tablet TAKE 1 TABLET DAILY (Patient taking differently: Take 10 mg by mouth daily.)   Blood Glucose Monitoring Suppl KIT Use as directed to check blood sugars 1 time per day dx:e11.22   Buprenorphine HCl 450 MCG FILM Place 450 mcg inside cheek 2 (two) times daily.   capsaicin (ZOSTRIX) 0.025 % cream Apply topically as needed.   Carboxymethylcellulose Sod PF (REFRESH CELLUVISC) 1 % GEL Apply 1 drop to eye at bedtime.   Carboxymethylcellulose Sodium (REFRESH TEARS OP) Apply 1 drop to eye in the morning, at noon, in the evening, and at bedtime.   EPINEPHrine  0.3 mg/0.3 mL IJ SOAJ injection Inject 0.3 mg into the muscle as needed for anaphylaxis.   famotidine (PEPCID) 10 MG tablet Take 10 mg by mouth at bedtime.   fluticasone  (FLONASE ) 50 MCG/ACT nasal spray Place 2 sprays into both nostrils daily.   gabapentin  (NEURONTIN ) 300 MG capsule Take 300 mg by mouth 4 (four) times daily.   glucose blood test strip Use as directed to check blood sugars 1 time per day dx:e11.22   hydrocortisone  2.5 % cream SMARTSIG:1 Topical Daily   ibuprofen (ADVIL) 800 MG tablet Take by mouth.   Lancets 33G MISC Use as directed to check blood sugars 1 time per day dx:e11.22   levalbuterol (XOPENEX HFA) 45 MCG/ACT inhaler  Inhale into the lungs.   LINZESS 145 MCG CAPS capsule Take 145 mcg by mouth daily as needed (constipation).    loratadine  (CLARITIN ) 10 MG tablet Take 10 mg by mouth daily.   meloxicam  (MOBIC ) 15 MG tablet Take 15 mg by mouth as needed.   methocarbamol (ROBAXIN) 500 MG tablet Take 500-1,000 mg by mouth every 6 (six) hours as needed.   metoprolol  tartrate (LOPRESSOR ) 100 MG tablet Take 1 tablet (100 mg total) by mouth once for 1 dose. 2 hours prior to procedure   MI-ACID GAS RELIEF 80 MG chewable tablet    montelukast  (SINGULAIR ) 10 MG tablet TAKE 1 TABLET DAILY (Patient taking differently: Take 10 mg by mouth daily.)   pantoprazole  (PROTONIX ) 40 MG tablet TAKE 1 TABLET(40 MG) BY MOUTH DAILY    sertraline (ZOLOFT) 100 MG tablet Take 100 mg by mouth daily.   tadalafil (CIALIS) 5 MG tablet Take 5 mg by mouth daily.    tamsulosin  (FLOMAX ) 0.4 MG CAPS capsule Take 0.4 mg by mouth at bedtime.   Testosterone  30 MG/ACT SOLN Apply 30 mg topically daily. Apply under each arm   urea (CARMOL) 20 % cream Apply 1 application topically daily as needed (Feet pain).    IRON GLYCINATE PO Take 1 tablet by mouth daily. (Patient not taking: Reported on 01/07/2024)   temazepam (RESTORIL) 15 MG capsule Take by mouth. (Patient not taking: Reported on 01/07/2024)   traZODone (DESYREL) 150 MG tablet Take 150 mg by mouth as needed. (Patient not taking: Reported on 01/07/2024)   No facility-administered encounter medications on file as of 01/07/2024.    Allergies (verified) Patient has no known allergies.   History: Past Medical History:  Diagnosis Date   Arthritis    Asthma    GERD (gastroesophageal reflux disease)    Hearing loss    Hemorrhoids    Hyperlipidemia    Low testosterone  in male    Peripheral neuropathy    Sleep apnea    wears cpap   Sore throat    Wears glasses    Wears hearing aid in both ears    Past Surgical History:  Procedure Laterality Date   ANTERIOR CERVICAL DECOMP/DISCECTOMY FUSION N/A 01/09/2017   Procedure: ANTERIOR CERVICAL DECOMPRESSION FUSION, CERVICAL FOUR-FIVE, CERVICAL FIVE-SIX INSTRUMENTATION AND ALLOGRAFT;  Surgeon: Beuford Anes, MD;  Location: [facility] OR;  Service: Orthopedics;  Laterality: N/A;  ANTERIOR CERVICAL DECOMPRESSION FUSION, CERVICAL FOUR-FIVE, CERVICAL FIVE-SIX INSTRUMENTATION AND ALLOGRAFT   APPENDECTOMY     BIOPSY  11/12/2019   Procedure: BIOPSY;  Surgeon: Rollin Dover, MD;  Location: [facility];  Service: Endoscopy;;   CARPAL TUNNEL RELEASE Left    06/2022   CARPAL TUNNEL RELEASE Right    10/2022   COLONOSCOPY     ESOPHAGOGASTRODUODENOSCOPY (EGD) WITH PROPOFOL  N/A 11/12/2019   Procedure: ESOPHAGOGASTRODUODENOSCOPY (EGD) WITH PROPOFOL ;   Surgeon: Rollin Dover, MD;  Location: [facility];  Service: Endoscopy;  Laterality: N/A;   NECK SURGERY     SAVORY DILATION N/A 11/12/2019   Procedure: SAVORY DILATION;  Surgeon: Rollin Dover, MD;  Location: [facility];  Service: Endoscopy;  Laterality: N/A;   SHOULDER SURGERY  2005   rotator cuff and tendons repaired   Family History  Problem Relation Age of Onset   Diabetes Mother    Hypertension Mother    Healthy Father    Cancer Other    Social History   Socioeconomic History   Marital status: Married    Spouse name: Not on file   Number  of children: Not on file   Years of education: Not on file   Highest education level: Not on file  Occupational History   Not on file  Tobacco Use   Smoking status: Former    Current packs/day: 0.25    Average packs/day: 0.3 packs/day for 6.0 years (1.5 ttl pk-yrs)    Types: Cigarettes   Smokeless tobacco: Never  Vaping Use   Vaping status: Never Used  Substance and Sexual Activity   Alcohol use: Not Currently   Drug use: No   Sexual activity: Not on file  Other Topics Concern   Not on file  Social History Narrative   Not on file   Social Drivers of Health   Financial Resource Strain: Low Risk  (01/07/2024)   Overall Financial Resource Strain (CARDIA)    Difficulty of Paying Living Expenses: Not hard at all  Food Insecurity: No Food Insecurity (01/07/2024)   Hunger Vital Sign    Worried About Running Out of Food in the Last Year: Never true    Ran Out of Food in the Last Year: Never true  Transportation Needs: No Transportation Needs (01/07/2024)   PRAPARE - Administrator, Civil Service (Medical): No    Lack of Transportation (Non-Medical): No  Physical Activity: Insufficiently Active (01/07/2024)   Exercise Vital Sign    Days of Exercise per Week: 2 days    Minutes of Exercise per Session: 40 min  Stress: No Stress Concern Present (01/07/2024)   [employer] of Occupational Health - Occupational  Stress Questionnaire    Feeling of Stress: Only a little  Social Connections: Moderately Integrated (01/07/2024)   Social Connection and Isolation Panel    Frequency of Communication with Friends and Family: Twice a week    Frequency of Social Gatherings with Friends and Family: More than three times a week    Attends Religious Services: 1 to 4 times per year    Active Member of [employer] or Organizations: No    Attends Banker Meetings: Never    Marital Status: Married    Tobacco Counseling Counseling given: Not Answered    Clinical Intake:  Pre-visit preparation completed: Yes  Pain : No/denies pain     Nutritional Status: BMI 25 -29 Overweight Nutritional Risks: None Diabetes: No  Lab Results  Component Value Date   HGBA1C 5.5 06/13/2022   HGBA1C 5.9 (H) 12/12/2021   HGBA1C 6.0 (H) 11/23/2020     How often do you need to have someone help you when you read instructions, pamphlets, or other written materials from your doctor or pharmacy?: 1 - Never  Interpreter Needed?: No  Information entered by :: NAllen LPN   Activities of Daily Living     01/07/2024    2:11 PM  In your present state of health, do you have any difficulty performing the following activities:  Hearing? 1  Comment has hearing aids  Vision? 0  Difficulty concentrating or making decisions? 1  Comment forgetfulness  Walking or climbing stairs? 0  Dressing or bathing? 0  Doing errands, shopping? 0  Preparing Food and eating ? N  Using the Toilet? N  In the past six months, have you accidently leaked urine? N  Do you have problems with loss of bowel control? N  Managing your Medications? N  Managing your Finances? N  Housekeeping or managing your Housekeeping? N    Patient Care Team: Jarold Medici, MD as PCP - General (Internal Medicine) Mallipeddi, Vishnu  P, MD as PCP - Cardiology (Cardiology)  I have updated your Care Teams any recent Medical Services you may have received  from other providers in the past year.     Assessment:   This is a routine wellness examination for Trystian.  Hearing/Vision screen Hearing Screening - Comments:: Has hearing aids that are maintained Vision Screening - Comments:: [facility]   Goals Addressed             This Visit's Progress    Patient Stated       01/07/2024, wants to get surgeries (knee and spine)       Depression Screen     01/07/2024    2:24 PM 01/01/2023    2:27 PM 06/13/2022   11:35 AM 12/12/2021    4:05 PM 12/12/2021    2:17 PM 11/23/2020    2:23 PM 05/19/2019   11:33 AM  PHQ 2/9 Scores  PHQ - 2 Score 0 0 0 0 0 0 0  PHQ- 9 Score 6          Fall Risk     01/07/2024    2:23 PM 01/01/2023    2:26 PM 06/13/2022   11:36 AM 06/13/2022   11:35 AM 12/12/2021    4:05 PM  Fall Risk   Falls in the past year? 0 0 0 0 0  Number falls in past yr: 0 0 0 0 0  Injury with Fall? 0 0  0 0  Risk for fall due to : Medication side effect Medication side effect No Fall Risks No Fall Risks Medication side effect  Follow up Falls evaluation completed;Falls prevention discussed Falls prevention discussed;Falls evaluation completed Falls evaluation completed Falls evaluation completed Falls prevention discussed;Education provided;Falls evaluation completed      Data saved with a previous flowsheet row definition    MEDICARE RISK AT HOME:  Medicare Risk at Home Any stairs in or around the home?: No If so, are there any without handrails?: No Home free of loose throw rugs in walkways, pet beds, electrical cords, etc?: Yes Adequate lighting in your home to reduce risk of falls?: Yes Life alert?: No Use of a cane, walker or w/c?: No Grab bars in the bathroom?: No Shower chair or bench in shower?: Yes Elevated toilet seat or a handicapped toilet?: Yes  TIMED UP AND GO:  Was the test performed?  Yes  Length of time to ambulate 10 feet: 5 sec Gait steady and fast without use of assistive device  Cognitive  Function: 6CIT completed        01/07/2024    2:25 PM 01/01/2023    2:27 PM 12/12/2021    4:07 PM 11/23/2020    2:26 PM 11/17/2019    9:55 AM  6CIT Screen  What Year? 0 points 0 points 0 points 0 points   What month? 0 points 0 points 0 points 0 points   What time? 0 points 0 points 0 points 0 points 0 points  Count back from 20 0 points 0 points 0 points 0 points 0 points  Months in reverse 0 points 0 points 0 points 0 points 0 points  Repeat phrase 2 points 0 points 2 points 0 points 0 points  Total Score 2 points 0 points 2 points 0 points     Immunizations Immunization History  Administered Date(s) Administered   Fluad Quad(high Dose 65+) 12/13/2019, 11/23/2020, 12/12/2021   Fluad Trivalent(High Dose 65+) 01/01/2023   INFLUENZA, HIGH DOSE SEASONAL  PF 12/13/2019   Influenza,inj,Quad PF,6+ Mos 11/16/2018   Influenza-Unspecified 03/25/2006, 11/28/2020   Moderna Covid-19 Fall Seasonal Vaccine 20yrs & older 01/01/2022   PFIZER(Purple Top)SARS-COV-2 Vaccination 05/21/2019, 06/12/2019   Pneumococcal Polysaccharide-23 01/13/2020   Tdap 12/13/2019   Unspecified SARS-COV-2 Vaccination 11/29/2022   Zoster Recombinant(Shingrix) 01/13/2020, 07/31/2020    Screening Tests Health Maintenance  Topic Date Due   Influenza Vaccine  10/24/2023   COVID-19 Vaccine (6 - 2025-26 season) 11/24/2023   Medicare Annual Wellness (AWV)  01/06/2025   Colonoscopy  08/17/2028   DTaP/Tdap/Td (2 - Td or Tdap) 12/12/2029   Pneumococcal Vaccine: 50+ Years  Completed   Hepatitis C Screening  Completed   Zoster Vaccines- Shingrix  Completed   Meningococcal B Vaccine  Aged Out    Health Maintenance Items Addressed: Vaccines Given today: flu  Additional Screening:  Vision Screening: Recommended annual ophthalmology exams for early detection of glaucoma and other disorders of the eye. Is the patient up to date with their annual eye exam?  Yes  Who is the provider or what is the name of the office in  which the patient [facility]  Dental Screening: Recommended annual dental exams for proper oral hygiene  Community Resource Referral / Chronic Care Management: CRR required this visit?  No   CCM required this visit?  No   Plan:    I have personally reviewed and noted the following in the patient's chart:   Medical and social history Use of alcohol, tobacco or illicit drugs  Current medications and supplements including opioid prescriptions. Patient is not currently taking opioid prescriptions. Functional ability and status Nutritional status Physical activity Advanced directives List of other physicians Hospitalizations, surgeries, and ER visits in previous 12 months Vitals Screenings to include cognitive, depression, and falls Referrals and appointments  In addition, I have reviewed and discussed with patient certain preventive protocols, quality metrics, and best practice recommendations. A written personalized care plan for preventive services as well as general preventive health recommendations were provided to patient.   Ardella FORBES Dawn, LPN   [id number]   After Visit Summary: (In Person-Printed) AVS printed and given to the patient  Notes: Nothing significant to report at this time.

## 2024-01-07 NOTE — Patient Instructions (Signed)
 Hypertension, Adult Hypertension is another name for high blood pressure. High blood pressure forces your heart to work harder to pump blood. This can cause problems over time. There are two numbers in a blood pressure reading. There is a top number (systolic) over a bottom number (diastolic). It is best to have a blood pressure that is below 120/80. What are the causes? The cause of this condition is not known. Some other conditions can lead to high blood pressure. What increases the risk? Some lifestyle factors can make you more likely to develop high blood pressure: Smoking. Not getting enough exercise or physical activity. Being overweight. Having too much fat, sugar, calories, or salt (sodium) in your diet. Drinking too much alcohol. Other risk factors include: Having any of these conditions: Heart disease. Diabetes. High cholesterol. Kidney disease. Obstructive sleep apnea. Having a family history of high blood pressure and high cholesterol. Age. The risk increases with age. Stress. What are the signs or symptoms? High blood pressure may not cause symptoms. Very high blood pressure (hypertensive crisis) may cause: Headache. Fast or uneven heartbeats (palpitations). Shortness of breath. Nosebleed. Vomiting or feeling like you may vomit (nauseous). Changes in how you see. Very bad chest pain. Feeling dizzy. Seizures. How is this treated? This condition is treated by making healthy lifestyle changes, such as: Eating healthy foods. Exercising more. Drinking less alcohol. Your doctor may prescribe medicine if lifestyle changes do not help enough and if: Your top number is above 130. Your bottom number is above 80. Your personal target blood pressure may vary. Follow these instructions at home: Eating and drinking  If told, follow the DASH eating plan. To follow this plan: Fill one half of your plate at each meal with fruits and vegetables. Fill one fourth of your plate  at each meal with whole grains. Whole grains include whole-wheat pasta, brown rice, and whole-grain bread. Eat or drink low-fat dairy products, such as skim milk or low-fat yogurt. Fill one fourth of your plate at each meal with low-fat (lean) proteins. Low-fat proteins include fish, chicken without skin, eggs, beans, and tofu. Avoid fatty meat, cured and processed meat, or chicken with skin. Avoid pre-made or processed food. Limit the amount of salt in your diet to less than 1,500 mg each day. Do not drink alcohol if: Your doctor tells you not to drink. You are pregnant, may be pregnant, or are planning to become pregnant. If you drink alcohol: Limit how much you have to: 0-1 drink a day for women. 0-2 drinks a day for men. Know how much alcohol is in your drink. In the [place] one drink equals one 12 oz bottle of beer (355 mL), one 5 oz glass of wine (148 mL), or one 1 oz glass of hard liquor (44 mL). Lifestyle  Work with your doctor to stay at a healthy weight or to lose weight. Ask your doctor what the best weight is for you. Get at least 30 minutes of exercise that causes your heart to beat faster (aerobic exercise) most days of the week. This may include walking, swimming, or biking. Get at least 30 minutes of exercise that strengthens your muscles (resistance exercise) at least 3 days a week. This may include lifting weights or doing Pilates. Do not smoke or use any products that contain nicotine or tobacco. If you need help quitting, ask your doctor. Check your blood pressure at home as told by your doctor. Keep all follow-up visits. Medicines Take over-the-counter and prescription medicines  only as told by your doctor. Follow directions carefully. Do not skip doses of blood pressure medicine. The medicine does not work as well if you skip doses. Skipping doses also puts you at risk for problems. Ask your doctor about side effects or reactions to medicines that you should watch  for. Contact a doctor if: You think you are having a reaction to the medicine you are taking. You have headaches that keep coming back. You feel dizzy. You have swelling in your ankles. You have trouble with your vision. Get help right away if: You get a very bad headache. You start to feel mixed up (confused). You feel weak or numb. You feel faint. You have very bad pain in your: Chest. Belly (abdomen). You vomit more than once. You have trouble breathing. These symptoms may be an emergency. Get help right away. Call 911. Do not wait to see if the symptoms will go away. Do not drive yourself to the hospital. Summary Hypertension is another name for high blood pressure. High blood pressure forces your heart to work harder to pump blood. For most people, a normal blood pressure is less than 120/80. Making healthy choices can help lower blood pressure. If your blood pressure does not get lower with healthy choices, you may need to take medicine. This information is not intended to replace advice given to you by your health care provider. Make sure you discuss any questions you have with your health care provider. Document Revised: 12/28/2020 Document Reviewed: 12/28/2020 Elsevier Patient Education  2024 [employer].

## 2024-01-08 LAB — CMP14+EGFR
ALT: 21 IU/L (ref 0–44)
AST: 16 IU/L (ref 0–40)
Albumin: 4.3 g/dL (ref 3.9–4.9)
Alkaline Phosphatase: 78 IU/L (ref 47–123)
BUN/Creatinine Ratio: 17 (ref 10–24)
BUN: 16 mg/dL (ref 8–27)
Bilirubin Total: 0.8 mg/dL (ref 0.0–1.2)
CO2: 24 mmol/L (ref 20–29)
Calcium: 9.2 mg/dL (ref 8.6–10.2)
Chloride: 103 mmol/L (ref 96–106)
Creatinine, Ser: 0.95 mg/dL (ref 0.76–1.27)
Globulin, Total: 2.3 g/dL (ref 1.5–4.5)
Glucose: 91 mg/dL (ref 70–99)
Potassium: 4 mmol/L (ref 3.5–5.2)
Sodium: 140 mmol/L (ref 134–144)
Total Protein: 6.6 g/dL (ref 6.0–8.5)
eGFR: 87 mL/min/1.73 (ref >59)

## 2024-01-08 LAB — LIPID PANEL
Chol/HDL Ratio: 3.6 ratio (ref 0.0–5.0)
Cholesterol, Total: 141 mg/dL (ref 100–199)
HDL: 39 mg/dL — ABNORMAL LOW (ref >39)
LDL Chol Calc (NIH): 84 mg/dL (ref 0–99)
Triglycerides: 95 mg/dL (ref 0–149)
VLDL Cholesterol Cal: 18 mg/dL (ref 5–40)

## 2024-01-08 LAB — TESTOSTERONE,FREE AND TOTAL
Testosterone, Free: 6 pg/mL — ABNORMAL LOW (ref 6.6–18.1)
Testosterone: 574 ng/dL (ref 264–916)

## 2024-01-10 ENCOUNTER — Ambulatory Visit: Payer: Self-pay | Admitting: Internal Medicine

## 2024-01-10 NOTE — Assessment & Plan Note (Signed)
 Chronic back pain managed with buprenorphine and gabapentin . [facility] ([facility]) with upcoming nerve study for foot pain. - Continue current pain management regimen.

## 2024-01-10 NOTE — Assessment & Plan Note (Signed)
 Chronic, well controlled.  -Managed with amlodipine 10 mg daily. - Follow low sodium diet.

## 2024-01-10 NOTE — Assessment & Plan Note (Signed)
 Chronic, currently on statin therapy. LDL goal is less than 70, given underlying CAD.  - Optimal LDL goal is less than 55.

## 2024-01-10 NOTE — Assessment & Plan Note (Signed)
 Chronic, calcium  score 122. Cardiology input is appreciated. Therefore, LDL goal is less than 70.  - Continue with ASA 81mg  and atorvastatin  10mg  daily.  - Follow heart healthy lifestyle.

## 2024-01-21 ENCOUNTER — Inpatient Hospital Stay: Attending: Physician Assistant

## 2024-01-21 ENCOUNTER — Other Ambulatory Visit

## 2024-01-21 DIAGNOSIS — D709 Neutropenia, unspecified: Secondary | ICD-10-CM | POA: Insufficient documentation

## 2024-01-21 DIAGNOSIS — R718 Other abnormality of red blood cells: Secondary | ICD-10-CM

## 2024-01-21 DIAGNOSIS — D751 Secondary polycythemia: Secondary | ICD-10-CM | POA: Insufficient documentation

## 2024-01-21 DIAGNOSIS — D573 Sickle-cell trait: Secondary | ICD-10-CM

## 2024-01-21 DIAGNOSIS — D696 Thrombocytopenia, unspecified: Secondary | ICD-10-CM | POA: Insufficient documentation

## 2024-01-21 DIAGNOSIS — E611 Iron deficiency: Secondary | ICD-10-CM | POA: Insufficient documentation

## 2024-01-21 DIAGNOSIS — Z67A1 Duffy null: Secondary | ICD-10-CM

## 2024-01-21 DIAGNOSIS — D509 Iron deficiency anemia, unspecified: Secondary | ICD-10-CM

## 2024-01-21 LAB — IRON AND TIBC
Iron: 61 ug/dL (ref 45–182)
Saturation Ratios: 21 % (ref 17.9–39.5)
TIBC: 293 ug/dL (ref 250–450)
UIBC: 232 ug/dL

## 2024-01-21 LAB — CBC WITH DIFFERENTIAL/PLATELET
Abs Immature Granulocytes: 0.01 K/uL (ref 0.00–0.07)
Basophils Absolute: 0 K/uL (ref 0.0–0.1)
Basophils Relative: 1 %
Eosinophils Absolute: 0.2 K/uL (ref 0.0–0.5)
Eosinophils Relative: 5 %
HCT: 43.8 % (ref 39.0–52.0)
Hemoglobin: 14.9 g/dL (ref 13.0–17.0)
Immature Granulocytes: 0 %
Lymphocytes Relative: 40 %
Lymphs Abs: 1.4 K/uL (ref 0.7–4.0)
MCH: 29 pg (ref 26.0–34.0)
MCHC: 34 g/dL (ref 30.0–36.0)
MCV: 85.2 fL (ref 80.0–100.0)
Monocytes Absolute: 0.3 K/uL (ref 0.1–1.0)
Monocytes Relative: 8 %
Neutro Abs: 1.6 K/uL — ABNORMAL LOW (ref 1.7–7.7)
Neutrophils Relative %: 46 %
Platelets: 135 K/uL — ABNORMAL LOW (ref 150–400)
RBC: 5.14 MIL/uL (ref 4.22–5.81)
RDW: 12.9 % (ref 11.5–15.5)
WBC: 3.4 K/uL — ABNORMAL LOW (ref 4.0–10.5)
nRBC: 0 % (ref 0.0–0.2)

## 2024-01-21 LAB — COMPREHENSIVE METABOLIC PANEL WITH GFR
ALT: 14 U/L (ref 0–44)
AST: 17 U/L (ref 15–41)
Albumin: 4.3 g/dL (ref 3.5–5.0)
Alkaline Phosphatase: 70 U/L (ref 38–126)
Anion gap: 9 (ref 5–15)
BUN: 10 mg/dL (ref 8–23)
CO2: 28 mmol/L (ref 22–32)
Calcium: 9.1 mg/dL (ref 8.9–10.3)
Chloride: 104 mmol/L (ref 98–111)
Creatinine, Ser: 0.91 mg/dL (ref 0.61–1.24)
GFR, Estimated: >60 mL/min (ref >60)
Glucose, Bld: 98 mg/dL (ref 70–99)
Potassium: 4 mmol/L (ref 3.5–5.1)
Sodium: 141 mmol/L (ref 135–145)
Total Bilirubin: 0.8 mg/dL (ref 0.0–1.2)
Total Protein: 6.8 g/dL (ref 6.5–8.1)

## 2024-01-21 LAB — FERRITIN: Ferritin: 78 ng/mL (ref 24–336)

## 2024-01-21 LAB — LACTATE DEHYDROGENASE: LDH: 155 U/L (ref 98–192)

## 2024-01-27 NOTE — Progress Notes (Unsigned)
 [facility] [address], [place] [address]   CLINIC:  Medical Oncology/Hematology  PCP:  Jarold Medici, MD [address] [place] [address] [phone]    REASON FOR VISIT:  Follow-up for sickle cell trait, iron deficiency without anemia, mild thrombocytopenia   PRIOR THERAPY: None   CURRENT THERAPY: Ferrous bisglycinate  INTERVAL HISTORY:   Mr. Andrew Fisher 68 y.o. male returns for routine follow-up of iron deficiency, thrombocytopenia, and sickle cell trait.  He was last seen by Pleasant Barefoot PA-C on 07/23/2023.  At today's visit, he reports experiencing ongoing fatigue, arthralgia, and neuropathic pain.*** ***He has had some new nausea, being managed by his PCP.  He reports difficulty sleeping and generalized fatigue.  ***No rectal bleeding, melena, easy bruising, or petechial rash.  No B symptoms or frequent infections.  He reports fatigue. ***Iron supplement (ferrous bisglycinate) was stopped after last visit.  ***He reports intermittent shortness of breath from asthma.  Occasional chest pain.  His PCP is monitoring his chest pain, and received echo and referral to cardiology.   He has occasional headaches.  No syncope or dizziness.    ***He has not been using his topical testosterone  for the past 3 to 4 months, but restarted 2 weeks ago.   ***He was unable to tolerate CPAP, and has been using mouth insert for the past 2 months, continuing to follow with sleep medicine.  ***He has not given blood for the past 6 months.  He has 25***% energy and 60***% appetite.  ***He has lost about 5 pounds in the past 6 months due to difficulty eating related to nausea.***  ASSESSMENT & PLAN:  1.  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 - Workup of thrombocytopenia unremarkable: Normal nutritional panel (02/26/2022) with  normal copper , iron, folate, methylmalonic acid, B12 Normal immature platelet fraction at 3.8% Normal SPEP (November 2022) Negative ANA and rheumatoid factor (November 2021) Negative H. pylori IgG antibodies (testing done [facility] December 2023) Negative for hepatitis B, hepatitis C. - US  abdomen (06/27/2022): Negative for hepatosplenomegaly. - [facility] workup was unremarkable, and hematologist felt that thrombocytopenia was possibly due to ITP.  He was recommended to follow-up with local hematology every 3 months, and if stable transition to every 53-month follow-up. Bone marrow biopsy (08/14/2022): Normocellular marrow with no evidence of dysplasia or increase in blasts.  Myeloid hypoplasia and absent iron stores. NGS panel negative. 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  Neutropenia: - Chronic neutropenia since at least 2017 with baseline WBC 2.5-3.5 and ANC 1.3-1.5 - SPEP negative.  Normal folate, B12 and methylmalonic acid.  LDH normal. 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 - CBC/D with office visit in 6 months  3.  Iron deficiency without anemia  - EGD  (11/12/2019): Normal stomach and duodenum.  No significant abnormalities in the esophagus. - Colonoscopy (08/18/2018): Colon polyp, otherwise unremarkable. - Taking daily iron supplementation (ferrous bisglycinate) since November 2022 - Bone marrow biopsy ([facility] 08/14/2022) showed absent iron stores in bone marrow - No hematemesis, hematochezia, or melena*** - Most recent labs (01/21/2024): Ferritin 78, iron saturation 21% - PLAN: Patient can discontinue iron supplement at this time.  *** RESTART iron supplement *** - No need for IV iron at this time, but will consider IV iron if it is still low at follow-up - Repeat CBC/iron panel and RTC in 6 months.  4.  Erythrocytosis - He was previously noted to have mild intermittent erythrocytosis, likely related to OSA and topical testosterone  supplements.  Also has some chronic elevations in RBCs with microcytosis that is attributable to his sickle cell trait. - [facility] workup with normal bone marrow biopsy (08/14/2022), normal cytogenetics, and normal NGS panel - ***He has not been using his topical testosterone  for the past 3 to 4 months, but restarted 2 weeks ago.*** - He was unable to tolerate CPAP, and has been using mouth insert for the past 2 months, continuing to follow with sleep medicine.*** - He has not given blood for the past 6 months.*** - Most recent CBC/D (01/21/2024): Hgb 14.9/hematocrit 43.8 % - He has fatigue, dizziness, and headaches. *** No aquagenic pruritus. 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  Blood counts normalized when he stopped using testosterone  supplements, but he has recently restarted this.*** - Continue voluntary blood donation every 3 months.*** - Continue aspirin 81 mg daily in the setting of secondary erythrocytosis and other cardiac risk factors (hypertension, hyperlipidemia, diabetes)***   5.  Sickle cell trait - Hemoglobin electrophoresis  (02/02/2021) consistent with sickle cell trait (heterozygous) - Abdominal US  06/27/2022 with normal appearing kidneys, no masses. Patient remains asymptomatic.  - Patient is asymptomatic, has never had any symptoms suggestive of acute pain crisis or venoocclusive events.   - Discussed with patient regarding sickle cell trait: Benign carrier condition that rarely results in significant clinical issues. In very rare cases patients can have symptoms of acute pain and venoocclusive disease. At risk for some blood sickling in cases of significant hypoxia (such as high-altitude and pressurized aircraft, very strenuous exercise with dehydration, etc.). Children and grandchildren would benefit from being screened for sickle cell trait to assist with future reproductive decisions. - PLAN: No indication for ongoing hematology follow-up of asymptomatic sickle cell trait.   6.  Social/family history: - He worked in the [employer] for 23 years and retired as a first chief strategy officer.  No clear exposure to chemicals.  Quit smoking in 1979. - He has sleep apnea and has been using CPAP since 2006. - Son had HBs trait.  Mother is a Jehovah witness and also had colon cancer.  Maternal aunt died of ovarian cancer in her 12s.   PLAN SUMMARY: *** >> Labs in 6 months = CBC/D, CMP, LDH, ferritin, iron/TIBC >> OFFICE visit in 6 months (1 week after labs)     REVIEW OF SYSTEMS: ***  Review of Systems  Constitutional:  Positive for fatigue. Negative for appetite change, chills, diaphoresis, fever and unexpected weight change.  HENT:   Negative for lump/mass and nosebleeds.   Eyes:  Negative for eye problems.  Respiratory:  Positive for  shortness of breath (at times, none today). Negative for cough and hemoptysis.   Cardiovascular:  Positive for chest pain (at times, none today). Negative for leg swelling and palpitations.  Gastrointestinal:  Positive for nausea. Negative for abdominal pain, blood in stool,  constipation, diarrhea and vomiting.  Genitourinary:  Negative for hematuria.   Musculoskeletal:  Positive for arthralgias and back pain.  Skin: Negative.   Neurological:  Positive for dizziness, headaches and numbness. Negative for light-headedness.  Hematological:  Does not bruise/bleed easily.  Psychiatric/Behavioral:  Positive for depression and sleep disturbance. The patient is nervous/anxious.      PHYSICAL EXAM:***  ECOG PERFORMANCE STATUS: 1 - Symptomatic but completely ambulatory  There were no vitals filed for this visit.  There were no vitals filed for this visit.  Physical Exam Constitutional:      Appearance: Normal appearance. He is obese.  Cardiovascular:     Heart sounds: Normal heart sounds.  Pulmonary:     Breath sounds: Normal breath sounds.  Neurological:     General: No focal deficit present.     Mental Status: Mental status is at baseline.  Psychiatric:        Behavior: Behavior normal. Behavior is cooperative.    PAST MEDICAL/SURGICAL HISTORY:  Past Medical History:  Diagnosis Date   Arthritis    Asthma    GERD (gastroesophageal reflux disease)    Hearing loss    Hemorrhoids    Hyperlipidemia    Low testosterone  in male    Peripheral neuropathy    Sleep apnea    wears cpap   Sore throat    Wears glasses    Wears hearing aid in both ears    Past Surgical History:  Procedure Laterality Date   ANTERIOR CERVICAL DECOMP/DISCECTOMY FUSION N/A 01/09/2017   Procedure: ANTERIOR CERVICAL DECOMPRESSION FUSION, CERVICAL FOUR-FIVE, CERVICAL FIVE-SIX INSTRUMENTATION AND ALLOGRAFT;  Surgeon: Beuford Anes, MD;  Location: [facility] OR;  Service: Orthopedics;  Laterality: N/A;  ANTERIOR CERVICAL DECOMPRESSION FUSION, CERVICAL FOUR-FIVE, CERVICAL FIVE-SIX INSTRUMENTATION AND ALLOGRAFT   APPENDECTOMY     BIOPSY  11/12/2019   Procedure: BIOPSY;  Surgeon: Rollin Dover, MD;  Location: [facility];  Service: Endoscopy;;   CARPAL TUNNEL RELEASE Left    06/2022    CARPAL TUNNEL RELEASE Right    10/2022   COLONOSCOPY     ESOPHAGOGASTRODUODENOSCOPY (EGD) WITH PROPOFOL  N/A 11/12/2019   Procedure: ESOPHAGOGASTRODUODENOSCOPY (EGD) WITH PROPOFOL ;  Surgeon: Rollin Dover, MD;  Location: [facility];  Service: Endoscopy;  Laterality: N/A;   NECK SURGERY     SAVORY DILATION N/A 11/12/2019   Procedure: SAVORY DILATION;  Surgeon: Rollin Dover, MD;  Location: [facility];  Service: Endoscopy;  Laterality: N/A;   SHOULDER SURGERY  2005   rotator cuff and tendons repaired    SOCIAL HISTORY:  Social History   Socioeconomic History   Marital status: Married    Spouse name: Not on file   Number of children: Not on file   Years of education: Not on file   Highest education level: Not on file  Occupational History   Not on file  Tobacco Use   Smoking status: Former    Current packs/day: 0.25    Average packs/day: 0.3 packs/day for 6.0 years (1.5 ttl pk-yrs)    Types: Cigarettes   Smokeless tobacco: Never  Vaping Use   Vaping status: Never Used  Substance and Sexual Activity   Alcohol use: Not Currently   Drug use: No   Sexual activity: Not on  file  Other Topics Concern   Not on file  Social History Narrative   Not on file   Social Drivers of Health   Financial Resource Strain: Low Risk  (01/07/2024)   Overall Financial Resource Strain (CARDIA)    Difficulty of Paying Living Expenses: Not hard at all  Food Insecurity: No Food Insecurity (01/07/2024)   Hunger Vital Sign    Worried About Running Out of Food in the Last Year: Never true    Ran Out of Food in the Last Year: Never true  Transportation Needs: No Transportation Needs (01/07/2024)   PRAPARE - Administrator, Civil Service (Medical): No    Lack of Transportation (Non-Medical): No  Physical Activity: Insufficiently Active (01/07/2024)   Exercise Vital Sign    Days of Exercise per Week: 2 days    Minutes of Exercise per Session: 40 min  Stress: No Stress Concern Present  (01/07/2024)   [employer] of Occupational Health - Occupational Stress Questionnaire    Feeling of Stress: Only a little  Social Connections: Moderately Integrated (01/07/2024)   Social Connection and Isolation Panel    Frequency of Communication with Friends and Family: Twice a week    Frequency of Social Gatherings with Friends and Family: More than three times a week    Attends Religious Services: 1 to 4 times per year    Active Member of [employer] or Organizations: No    Attends Banker Meetings: Never    Marital Status: Married  Catering Manager Violence: Not At Risk (01/07/2024)   Humiliation, Afraid, Rape, and Kick questionnaire    Fear of Current or Ex-Partner: No    Emotionally Abused: No    Physically Abused: No    Sexually Abused: No    FAMILY HISTORY:  Family History  Problem Relation Age of Onset   Diabetes Mother    Hypertension Mother    Healthy Father    Cancer Other     CURRENT MEDICATIONS:  Outpatient Encounter Medications as of 01/28/2024  Medication Sig   acetaminophen  (TYLENOL ) 500 MG tablet Take by mouth.   albuterol  (VENTOLIN  HFA) 108 (90 Base) MCG/ACT inhaler USE 1 TO 2 INHALATIONS EVERY 6 HOURS AS NEEDED FOR WHEEZING OR SHORTNESS OF BREATH (Patient taking differently: Inhale 2 puffs into the lungs every 6 (six) hours as needed for wheezing or shortness of breath.)   amLODipine (NORVASC) 10 MG tablet Take 10 mg by mouth daily.   aspirin EC 81 MG tablet Take 81 mg by mouth daily.   atorvastatin  (LIPITOR) 10 MG tablet TAKE 1 TABLET DAILY (Patient taking differently: Take 10 mg by mouth daily.)   Blood Glucose Monitoring Suppl KIT Use as directed to check blood sugars 1 time per day dx:e11.22   Buprenorphine HCl 450 MCG FILM Place 450 mcg inside cheek 2 (two) times daily.   capsaicin (ZOSTRIX) 0.025 % cream Apply topically as needed.   Carboxymethylcellulose Sod PF (REFRESH CELLUVISC) 1 % GEL Apply 1 drop to eye at bedtime.    Carboxymethylcellulose Sodium (REFRESH TEARS OP) Apply 1 drop to eye in the morning, at noon, in the evening, and at bedtime.   EPINEPHrine  0.3 mg/0.3 mL IJ SOAJ injection Inject 0.3 mg into the muscle as needed for anaphylaxis.   famotidine (PEPCID) 10 MG tablet Take 10 mg by mouth at bedtime.   fluticasone  (FLONASE ) 50 MCG/ACT nasal spray Place 2 sprays into both nostrils daily.   gabapentin  (NEURONTIN ) 300 MG capsule Take 300 mg  by mouth 4 (four) times daily.   glucose blood test strip Use as directed to check blood sugars 1 time per day dx:e11.22   hydrocortisone  2.5 % cream SMARTSIG:1 Topical Daily   ibuprofen (ADVIL) 800 MG tablet Take by mouth.   IRON GLYCINATE PO Take 1 tablet by mouth daily. (Patient not taking: Reported on 01/07/2024)   Lancets 33G MISC Use as directed to check blood sugars 1 time per day dx:e11.22   levalbuterol (XOPENEX HFA) 45 MCG/ACT inhaler Inhale into the lungs.   LINZESS 145 MCG CAPS capsule Take 145 mcg by mouth daily as needed (constipation).    loratadine  (CLARITIN ) 10 MG tablet Take 10 mg by mouth daily.   meloxicam  (MOBIC ) 15 MG tablet Take 15 mg by mouth as needed.   methocarbamol (ROBAXIN) 500 MG tablet Take 500-1,000 mg by mouth every 6 (six) hours as needed.   MI-ACID GAS RELIEF 80 MG chewable tablet    montelukast  (SINGULAIR ) 10 MG tablet TAKE 1 TABLET DAILY (Patient taking differently: Take 10 mg by mouth daily.)   pantoprazole  (PROTONIX ) 40 MG tablet TAKE 1 TABLET(40 MG) BY MOUTH DAILY   sertraline (ZOLOFT) 100 MG tablet Take 100 mg by mouth daily.   tadalafil (CIALIS) 5 MG tablet Take 5 mg by mouth daily.    tamsulosin  (FLOMAX ) 0.4 MG CAPS capsule Take 0.4 mg by mouth at bedtime.   temazepam (RESTORIL) 15 MG capsule Take by mouth. (Patient not taking: Reported on 01/07/2024)   Testosterone  30 MG/ACT SOLN Apply 30 mg topically daily. Apply under each arm   traZODone (DESYREL) 150 MG tablet Take 150 mg by mouth as needed. (Patient not taking:  Reported on 01/07/2024)   urea (CARMOL) 20 % cream Apply 1 application topically daily as needed (Feet pain).    No facility-administered encounter medications on file as of 01/28/2024.    ALLERGIES:  No Known Allergies  LABORATORY DATA:  I have reviewed the labs as listed.  CBC    Component Value Date/Time   WBC 3.4 (L) 01/21/2024 1010   RBC 5.14 01/21/2024 1010   HGB 14.9 01/21/2024 1010   HGB 14.9 07/02/2023 1515   HCT 43.8 01/21/2024 1010   HCT 45.3 07/02/2023 1515   PLT 135 (L) 01/21/2024 1010   PLT 138 (L) 07/02/2023 1515   MCV 85.2 01/21/2024 1010   MCV 88 07/02/2023 1515   MCH 29.0 01/21/2024 1010   MCHC 34.0 01/21/2024 1010   RDW 12.9 01/21/2024 1010   RDW 13.1 07/02/2023 1515   LYMPHSABS 1.4 01/21/2024 1010   LYMPHSABS 0.9 07/02/2023 1515   MONOABS 0.3 01/21/2024 1010   EOSABS 0.2 01/21/2024 1010   EOSABS 0.1 07/02/2023 1515   BASOSABS 0.0 01/21/2024 1010   BASOSABS 0.0 07/02/2023 1515      Latest Ref Rng & Units 01/21/2024   10:10 AM 01/07/2024    3:06 PM 07/02/2023    3:15 PM  CMP  Glucose 70 - 99 mg/dL 98  91  88   BUN 8 - 23 mg/dL 10  16  12    Creatinine 0.61 - 1.24 mg/dL 9.08  9.04  9.00   Sodium 135 - 145 mmol/L 141  140  142   Potassium 3.5 - 5.1 mmol/L 4.0  4.0  4.0   Chloride 98 - 111 mmol/L 104  103  102   CO2 22 - 32 mmol/L 28  24  25    Calcium  8.9 - 10.3 mg/dL 9.1  9.2  9.2   Total  Protein 6.5 - 8.1 g/dL 6.8  6.6  6.5   Total Bilirubin 0.0 - 1.2 mg/dL 0.8  0.8  0.6   Alkaline Phos 38 - 126 U/L 70  78  79   AST 15 - 41 U/L 17  16  22    ALT 0 - 44 U/L 14  21  22      DIAGNOSTIC IMAGING:  I have independently reviewed the relevant imaging and discussed with the patient.   WRAP UP:  All questions were answered. The patient knows to call the clinic with any problems, questions or concerns.  Medical decision making: Moderate  Time spent on visit: I spent 20 minutes counseling the patient face to face. The total time spent in the  appointment was 30 minutes and more than 50% was on counseling.  Pleasant CHRISTELLA Barefoot, PA-C  ***

## 2024-01-28 ENCOUNTER — Ambulatory Visit: Admitting: Physician Assistant

## 2024-01-28 ENCOUNTER — Inpatient Hospital Stay: Attending: Physician Assistant | Admitting: Physician Assistant

## 2024-01-28 VITALS — BP 103/64 | HR 62 | Temp 98.0°F | Resp 18 | Ht 68.0 in | Wt 183.0 lb

## 2024-01-28 DIAGNOSIS — Z67A1 Duffy null: Secondary | ICD-10-CM

## 2024-01-28 DIAGNOSIS — E611 Iron deficiency: Secondary | ICD-10-CM | POA: Insufficient documentation

## 2024-01-28 DIAGNOSIS — D751 Secondary polycythemia: Secondary | ICD-10-CM | POA: Insufficient documentation

## 2024-01-28 DIAGNOSIS — D573 Sickle-cell trait: Secondary | ICD-10-CM | POA: Insufficient documentation

## 2024-01-28 DIAGNOSIS — D709 Neutropenia, unspecified: Secondary | ICD-10-CM | POA: Diagnosis not present

## 2024-01-28 DIAGNOSIS — Z87891 Personal history of nicotine dependence: Secondary | ICD-10-CM | POA: Diagnosis not present

## 2024-01-28 DIAGNOSIS — R519 Headache, unspecified: Secondary | ICD-10-CM | POA: Diagnosis not present

## 2024-01-28 DIAGNOSIS — D696 Thrombocytopenia, unspecified: Secondary | ICD-10-CM | POA: Diagnosis not present

## 2024-01-28 DIAGNOSIS — R5383 Other fatigue: Secondary | ICD-10-CM | POA: Insufficient documentation

## 2024-01-28 DIAGNOSIS — D693 Immune thrombocytopenic purpura: Secondary | ICD-10-CM

## 2024-01-28 DIAGNOSIS — R42 Dizziness and giddiness: Secondary | ICD-10-CM | POA: Diagnosis not present

## 2024-01-28 DIAGNOSIS — R718 Other abnormality of red blood cells: Secondary | ICD-10-CM

## 2024-01-28 DIAGNOSIS — D509 Iron deficiency anemia, unspecified: Secondary | ICD-10-CM

## 2024-01-28 NOTE — Patient Instructions (Signed)
 [facility] at [facility] **VISIT SUMMARY & IMPORTANT INSTRUCTIONS **   You were seen today by Pleasant Barefoot PA-C for your follow-up visit.    LOW PLATELETS & LOW WHITE BLOOD CELLS You continue to have mildly low platelets and low white blood cells. Your tests did NOT show any signs of blood or bone marrow cancer. Your low white blood cells are likely a normal variant and nothing to be concerned about. Your low platelets are possibly due to immune system dysfunction, but do not need treatment at this time. We will see you for follow-up visit in 6 months  IRON DEFICIENCY Your iron levels have dropped a little bit since your last visit. Please restart your iron tablet on Mondays, Wednesdays, and Fridays.  ELEVATED RED BLOOD CELLS Your red blood cells and hemoglobin were previously elevated due to your testosterone  supplement. Since you have not been taking testosterone  supplement lately, your blood counts are back to normal. Once you restart your testosterone , it is likely that your blood counts will become elevated again. Your elevated blood cells put you at increased risk of blood clots, heart, attack and stroke. Take aspirin 81 mg daily. Donate blood once every 3-6 months  OTHER CONCERNS:  Please discuss with your primary care doctor regarding your nausea, poor appetite, and weight loss.  FOLLOW-UP APPOINTMENT: Follow-up visit in 6 months  ** Thank you for trusting me with your healthcare!  I strive to provide all of my patients with quality care at each visit.  If you receive a survey for this visit, I would be so grateful to you for taking the time to provide feedback.  Thank you in advance!  ~ Adaliz Dobis                                        Dr. Mickiel Davonna Pleasant Barefoot, PA-C      Delon Hope, NP   - - - - - - - - - - - - - - - - - -     Thank you for choosing [facility] at [facility] to provide your oncology  and hematology care.  To afford each patient quality time with our provider, please arrive at least 15 minutes before your scheduled appointment time.   If you have a lab appointment with the Cancer Center please come in thru the Main Entrance and check in at the main information desk.  You need to re-schedule your appointment should you arrive 10 or more minutes late.  We strive to give you quality time with our providers, and arriving late affects you and other patients whose appointments are after yours.  Also, if you no show three or more times for appointments you may be dismissed from the clinic at the providers discretion.     Again, thank you for choosing [facility].  Our hope is that these requests will decrease the amount of time that you wait before being seen by our physicians.       _____________________________________________________________  Should you have questions after your visit to [facility], please contact our office at [phone] and follow the prompts.  Our office hours are 8:00 a.m. and 4:30 p.m. Monday - Friday.  Please note that voicemails left after 4:00 p.m. may not be returned until the following business  day.  We are closed weekends and major holidays.  You do have access to a nurse 24-7, just call the main number to the clinic [phone] and do not press any options, hold on the line and a nurse will answer the phone.    For prescription refill requests, have your pharmacy contact our office and allow 72 hours.

## 2024-03-12 NOTE — Therapy (Signed)
 " OUTPATIENT PHYSICAL THERAPY THORACOLUMBAR EVALUATION   Patient Name: Andrew Fisher MRN: [id number] DOB:[birth year], 68 y.o., male Today's Date: 03/15/2024  END OF SESSION:  PT End of Session - 03/15/24 0928     Visit Number 1    Authorization Type traditional [employer] medicare    Authorization Time Period no auth needed          Past Medical History:  Diagnosis Date   Arthritis    Asthma    GERD (gastroesophageal reflux disease)    Hearing loss    Hemorrhoids    Hyperlipidemia    Low testosterone  in male    Peripheral neuropathy    Sleep apnea    wears cpap   Sore throat    Wears glasses    Wears hearing aid in both ears    Past Surgical History:  Procedure Laterality Date   ANTERIOR CERVICAL DECOMP/DISCECTOMY FUSION N/A 01/09/2017   Procedure: ANTERIOR CERVICAL DECOMPRESSION FUSION, CERVICAL FOUR-FIVE, CERVICAL FIVE-SIX INSTRUMENTATION AND ALLOGRAFT;  Surgeon: Beuford Anes, MD;  Location: [facility] OR;  Service: Orthopedics;  Laterality: N/A;  ANTERIOR CERVICAL DECOMPRESSION FUSION, CERVICAL FOUR-FIVE, CERVICAL FIVE-SIX INSTRUMENTATION AND ALLOGRAFT   APPENDECTOMY     BIOPSY  11/12/2019   Procedure: BIOPSY;  Surgeon: Rollin Dover, MD;  Location: [facility];  Service: Endoscopy;;   CARPAL TUNNEL RELEASE Left    06/2022   CARPAL TUNNEL RELEASE Right    10/2022   COLONOSCOPY     ESOPHAGOGASTRODUODENOSCOPY (EGD) WITH PROPOFOL  N/A 11/12/2019   Procedure: ESOPHAGOGASTRODUODENOSCOPY (EGD) WITH PROPOFOL ;  Surgeon: Rollin Dover, MD;  Location: [facility];  Service: Endoscopy;  Laterality: N/A;   NECK SURGERY     SAVORY DILATION N/A 11/12/2019   Procedure: SAVORY DILATION;  Surgeon: Rollin Dover, MD;  Location: [facility];  Service: Endoscopy;  Laterality: N/A;   SHOULDER SURGERY  2005   rotator cuff and tendons repaired   Patient Active Problem List   Diagnosis Date Noted   Coronary artery disease involving native coronary artery of native heart  without angina pectoris 01/07/2024   Palpitations 09/23/2023   Thrombocytopenia 07/02/2023   Dietary iron deficiency without anemia 07/02/2023   Gastroesophageal reflux disease without esophagitis 07/02/2023   Atypical chest pain 07/02/2023   Chronic bilateral low back pain without sciatica 07/02/2023   Hypogonadism in male 07/02/2023   Essential hypertension, benign 01/01/2023   Other abnormal glucose 01/01/2023   Class 1 obesity due to excess calories with serious comorbidity and body mass index (BMI) of 30.0 to 30.9 in adult 01/01/2023   Hypokalemia 01/01/2023   Benign ethnic neutropenia 06/13/2022   Sickle cell trait 06/13/2022   Visit for TB skin test 05/04/2018   Pure hypercholesterolemia 03/26/2018   Family history of colon cancer 03/26/2018   Cough 03/26/2018   Allergic rhinitis due to allergen 03/26/2018   Hematoma of neck 01/11/2017   S/P cervical spinal fusion 01/11/2017   Radiculopathy 01/09/2017   Hemorrhoids, internal 12/11/2010    PCP: Jarold Medici, MD  REFERRING PROVIDER: Volanda Saupe, MD  REFERRING DIAG: M53.3 (ICD-10-CM) - Sacrococcygeal disorders, not elsewhere classified  Rationale for Evaluation and Treatment: Rehabilitation  THERAPY DIAG:  Low back pain, unspecified back pain laterality, unspecified chronicity, unspecified whether sciatica present  Coccyx pain  ONSET DATE: 8 months ago  SUBJECTIVE:  SUBJECTIVE STATEMENT: Late check in; His neighbor fell about 8 months ago and he was trying to help her up and he fell.  [facility] gave [facility] pain medication but he is still having pain the most with sitting and laying down; he does get some relief with sleeping on his side.    PERTINENT HISTORY:  CPAP Cervical fusion 2018 Sickle cell trait Awaiting left knee surgery and  cervical fusion  PAIN:  Are you having pain? Yes: NPRS scale: 0-8/10 Pain location: coccyx, hamstrings, adductors Pain description: sore Aggravating factors: sitting, laying supine Relieving factors: movement, medication  PRECAUTIONS: None  RED FLAGS: None   WEIGHT BEARING RESTRICTIONS: No  FALLS:  Has patient fallen in last 6 months? Yes. Number of falls 1   OCCUPATION: retired veteran  PLOF: Independent  PATIENT GOALS: less pain  NEXT MD VISIT: PRN  OBJECTIVE:  Note: Objective measures were completed at Evaluation unless otherwise noted.  DIAGNOSTIC FINDINGS:    PATIENT SURVEYS:  Oswestry Low Back Pain Disability Questionnaire:  OSWESTRY LBP DISABILITY Questionnaire  Date: 03/15/2024 Score                                Total 19/50; 38%   Interpretation of scores: Score Category Description  0-20% Minimal Disability The patient can cope with most living activities. Usually no treatment is indicated apart from advice on lifting, sitting and exercise  21-40% Moderate Disability The patient experiences more pain and difficulty with sitting, lifting and standing. Travel and social life are more difficult and they may be disabled from work. Personal care, sexual activity and sleeping are not grossly affected, and the patient can usually be managed by conservative means  41-60% Severe Disability Pain remains the main problem in this group, but activities of daily living are affected. These patients require a detailed investigation  61-80% Crippled Back pain impinges on all aspects of the patients life. Positive intervention is required  81-100% Bed-bound These patients are either bed-bound or exaggerating their symptoms  Reference: Adelle SPEAK, Pynsent PB. The Oswestry Disability Index. Spine 2000 Nov 15;25(22):2940-52; discussion 16.  Minimum detectable change (90% confidence): 10% points   COGNITION: Overall cognitive status: Within functional limits for  tasks assessed     SENSATION: WFL  MUSCLE LENGTH: Hamstrings:   POSTURE: No Significant postural limitations  PALPATION: No tenderness with sacral pressure  LUMBAR ROM:   AROM eval  Flexion Fingertips to mid shins  Extension 30% available *  Right lateral flexion   Left lateral flexion   Right rotation   Left rotation    (Blank rows = not tested)  LOWER EXTREMITY ROM:     Active  Right eval Left eval  Hip flexion    Hip extension    Hip abduction    Hip adduction    Hip internal rotation    Hip external rotation    Knee flexion    Knee extension    Ankle dorsiflexion    Ankle plantarflexion    Ankle inversion    Ankle eversion     (Blank rows = not tested)  LOWER EXTREMITY MMT:    MMT Right eval Left eval  Hip flexion 4+ 4+  Hip extension 4 4  Hip abduction    Hip adduction    Hip internal rotation    Hip external rotation    Knee flexion 4+ 4+  Knee extension 5 4+  Ankle dorsiflexion 5  5  Ankle plantarflexion    Ankle inversion    Ankle eversion     (Blank rows = not tested)  LUMBAR SPECIAL TESTS:  SI Compression/distraction test: Negative  FUNCTIONAL TESTS:  5 times sit to stand: 15.48 sec hands on thight  GAIT: Distance walked: 50 ft in clinic Assistive device utilized: None Level of assistance: Complete Independence Comments: no significant gait deviation noted  TREATMENT DATE: 03/15/24 physical therapy evaluation and HEP instruction                                                                                                                                 PATIENT EDUCATION:  Education details: Patient educated on exam findings, POC, scope of PT, HEP, and what to expect next visit. Person educated: Patient Education method: Explanation, Demonstration, and Handouts Education comprehension: verbalized understanding, returned demonstration, verbal cues required, and tactile cues required   HOME EXERCISE PROGRAM: Access Code:  [email or web address] URL: [email or web address] Date: 03/15/2024 Prepared by: [facility] - Rehab  Exercises - Supine Hamstring Stretch  - 2 x daily - 7 x weekly - 1 sets - 5 reps - 20 sec hold - Supine Figure 4 Piriformis Stretch  - 2 x daily - 7 x weekly - 1 sets - 5 reps - 20 sec hold  ASSESSMENT:  CLINICAL IMPRESSION: Patient is a 68 y.o. male who was seen today for physical therapy evaluation and treatment for M53.3 (ICD-10-CM) - Sacrococcygeal disorders, not elsewhere classified. Patient demonstrates muscle weakness, reduced ROM, and fascial restrictions which are likely contributing to symptoms of pain and are negatively impacting patient ability to perform ADLs and functional mobility tasks. Patient will benefit from skilled physical therapy services to address these deficits to reduce pain and improve level of function with ADLs and functional mobility tasks.   OBJECTIVE IMPAIRMENTS: decreased activity tolerance, decreased ROM, decreased strength, increased fascial restrictions, and impaired perceived functional ability.   ACTIVITY LIMITATIONS: sitting and bed mobility  PARTICIPATION LIMITATIONS: driving  PERSONAL FACTORS: left knee meniscus tear; also awaiting neck fusion and history of back issues are also affecting patient's functional outcome.   REHAB POTENTIAL: Good  CLINICAL DECISION MAKING: Evolving/moderate complexity  EVALUATION COMPLEXITY: Moderate   GOALS: Goals reviewed with patient? No  SHORT TERM GOALS: Target date: 04/05/2024  patient will be independent with initial HEP and compliant with HEP 3-4 times a week   Baseline: Goal status: INITIAL  2.  Patient will report 50% improvement overall  Baseline:  Goal status: INITIAL  LONG TERM GOALS: Target date: 04/26/2024  Patient will be independent in self management strategies to improve quality of life and functional outcomes.  Baseline:  Goal status: INITIAL  2.  Patient will report 70% improvement  overall  Baseline:  Goal status: INITIAL  3.  Patient will improve Modified Oswestry score by 6 points to demonstrate improved perceived function  Baseline: 19/50 Goal status: INITIAL  4.  Patient will improve 5  times sit to stand score to 12 sec or less to demonstrate improved functional mobility and increased leg strength.    Baseline: 15.48 sec Goal status: INITIAL  5.  Patient will be able to sit x 1 hour to watch a movie or drive out of town without pain > 2/10  Baseline: 8/10 after sitting 30 min Goal status: INITIAL   PLAN:  PT FREQUENCY: 2x/week  PT DURATION: 6 weeks  PLANNED INTERVENTIONS: 97164- PT Re-evaluation, 97110-Therapeutic exercises, 97530- Therapeutic activity, 97112- Neuromuscular re-education, 97535- Self Care, [id number]- Manual therapy, [id number]- Gait training, [phone]- Orthotic Fit/training, [id number]- Canalith repositioning, [id number]- Aquatic Therapy, 97760- Splinting, [phone]- Wound care (first 20 sq cm), 97598- Wound care (each additional 20 sq cm)Patient/Family education, Balance training, Stair training, Taping, Dry Needling, Joint mobilization, Joint manipulation, Spinal manipulation, Spinal mobilization, Scar mobilization, and DME instructions. SABRA  PLAN FOR NEXT SESSION: Review HEP and goals; stretching around sacrum, hips and coccyx; low back strengthening   9:40 AM, 03/15/2024 Ottavio Norem Small Zaydon Kinser MPT [facility] [place] [phone] Ph:[phone]   "

## 2024-03-15 ENCOUNTER — Other Ambulatory Visit: Payer: Self-pay

## 2024-03-15 ENCOUNTER — Ambulatory Visit (HOSPITAL_COMMUNITY)

## 2024-03-15 DIAGNOSIS — M545 Low back pain, unspecified: Secondary | ICD-10-CM | POA: Insufficient documentation

## 2024-03-15 DIAGNOSIS — M533 Sacrococcygeal disorders, not elsewhere classified: Secondary | ICD-10-CM | POA: Insufficient documentation

## 2024-03-22 ENCOUNTER — Encounter: Payer: Self-pay | Admitting: *Deleted

## 2024-03-23 ENCOUNTER — Ambulatory Visit (HOSPITAL_COMMUNITY)

## 2024-03-23 ENCOUNTER — Encounter (HOSPITAL_COMMUNITY): Payer: Self-pay

## 2024-03-23 DIAGNOSIS — M533 Sacrococcygeal disorders, not elsewhere classified: Secondary | ICD-10-CM

## 2024-03-23 DIAGNOSIS — M545 Low back pain, unspecified: Secondary | ICD-10-CM

## 2024-03-23 NOTE — Therapy (Signed)
 " OUTPATIENT PHYSICAL THERAPY THORACOLUMBAR TREATMENT   Patient Name: Andrew Fisher MRN: [id number] DOB:02-01-1956, 68 y.o., male Today's Date: 03/23/2024  END OF SESSION:  PT End of Session - 03/23/24 1417     Visit Number 2    Authorization Type [employer]    Authorization Time Period no auth needed    PT Start Time 1417    PT Stop Time 1500    PT Time Calculation (min) 43 min    Activity Tolerance Patient tolerated treatment well    Behavior During Therapy WFL for tasks assessed/performed           Past Medical History:  Diagnosis Date   Arthritis    Asthma    GERD (gastroesophageal reflux disease)    Hearing loss    Hemorrhoids    Hyperlipidemia    Low testosterone  in male    Peripheral neuropathy    Sleep apnea    wears cpap   Sore throat    Wears glasses    Wears hearing aid in both ears    Past Surgical History:  Procedure Laterality Date   ANTERIOR CERVICAL DECOMP/DISCECTOMY FUSION N/A 01/09/2017   Procedure: ANTERIOR CERVICAL DECOMPRESSION FUSION, CERVICAL FOUR-FIVE, CERVICAL FIVE-SIX INSTRUMENTATION AND ALLOGRAFT;  Surgeon: Beuford Anes, MD;  Location: [facility] OR;  Service: Orthopedics;  Laterality: N/A;  ANTERIOR CERVICAL DECOMPRESSION FUSION, CERVICAL FOUR-FIVE, CERVICAL FIVE-SIX INSTRUMENTATION AND ALLOGRAFT   APPENDECTOMY     BIOPSY  11/12/2019   Procedure: BIOPSY;  Surgeon: Rollin Dover, MD;  Location: [facility];  Service: Endoscopy;;   CARPAL TUNNEL RELEASE Left    06/2022   CARPAL TUNNEL RELEASE Right    10/2022   COLONOSCOPY     ESOPHAGOGASTRODUODENOSCOPY (EGD) WITH PROPOFOL  N/A 11/12/2019   Procedure: ESOPHAGOGASTRODUODENOSCOPY (EGD) WITH PROPOFOL ;  Surgeon: Rollin Dover, MD;  Location: [facility];  Service: Endoscopy;  Laterality: N/A;   NECK SURGERY     SAVORY DILATION N/A 11/12/2019   Procedure: SAVORY DILATION;  Surgeon: Rollin Dover, MD;  Location: [facility];  Service: Endoscopy;  Laterality: N/A;    SHOULDER SURGERY  2005   rotator cuff and tendons repaired   Patient Active Problem List   Diagnosis Date Noted   Coronary artery disease involving native coronary artery of native heart without angina pectoris 01/07/2024   Palpitations 09/23/2023   Thrombocytopenia 07/02/2023   Dietary iron deficiency without anemia 07/02/2023   Gastroesophageal reflux disease without esophagitis 07/02/2023   Atypical chest pain 07/02/2023   Chronic bilateral low back pain without sciatica 07/02/2023   Hypogonadism in male 07/02/2023   Essential hypertension, benign 01/01/2023   Other abnormal glucose 01/01/2023   Class 1 obesity due to excess calories with serious comorbidity and body mass index (BMI) of 30.0 to 30.9 in adult 01/01/2023   Hypokalemia 01/01/2023   Benign ethnic neutropenia 06/13/2022   Sickle cell trait 06/13/2022   Visit for TB skin test 05/04/2018   Pure hypercholesterolemia 03/26/2018   Family history of colon cancer 03/26/2018   Cough 03/26/2018   Allergic rhinitis due to allergen 03/26/2018   Hematoma of neck 01/11/2017   S/P cervical spinal fusion 01/11/2017   Radiculopathy 01/09/2017   Hemorrhoids, internal 12/11/2010    PCP: Jarold Medici, MD  REFERRING PROVIDER: Volanda Saupe, MD  REFERRING DIAG: M53.3 (ICD-10-CM) - Sacrococcygeal disorders, not elsewhere classified  Rationale for Evaluation and Treatment: Rehabilitation  THERAPY DIAG:  Low back pain, unspecified back pain laterality, unspecified chronicity, unspecified whether sciatica present  Coccyx pain  ONSET DATE: 8 months ago  SUBJECTIVE:                                                                                                                                                                                           SUBJECTIVE STATEMENT: Patient reports that he is hurting a little today. He thinks that he may have over did his HEP some.    Eval: Late check in; His neighbor fell about 8  months ago and he was trying to help her up and he fell.  [facility] gave [facility] pain medication but he is still having pain the most with sitting and laying down; he does get some relief with sleeping on his side.    PERTINENT HISTORY:  CPAP Cervical fusion 2018 Sickle cell trait Awaiting left knee surgery and cervical fusion  PAIN:  Are you having pain? Yes: NPRS scale: 4/10 Pain location: coccyx, hamstrings, adductors Pain description: sore Aggravating factors: sitting, laying supine Relieving factors: movement, medication  PRECAUTIONS: None  RED FLAGS: None   WEIGHT BEARING RESTRICTIONS: No  FALLS:  Has patient fallen in last 6 months? Yes. Number of falls 1   OCCUPATION: retired veteran  PLOF: Independent  PATIENT GOALS: less pain  NEXT MD VISIT: PRN  OBJECTIVE:  Note: Objective measures were completed at Evaluation unless otherwise noted.  DIAGNOSTIC FINDINGS:    PATIENT SURVEYS:  Oswestry Low Back Pain Disability Questionnaire:  OSWESTRY LBP DISABILITY Questionnaire  Date: 03/15/2024 Score                                Total 19/50; 38%   Interpretation of scores: Score Category Description  0-20% Minimal Disability The patient can cope with most living activities. Usually no treatment is indicated apart from advice on lifting, sitting and exercise  21-40% Moderate Disability The patient experiences more pain and difficulty with sitting, lifting and standing. Travel and social life are more difficult and they may be disabled from work. Personal care, sexual activity and sleeping are not grossly affected, and the patient can usually be managed by conservative means  41-60% Severe Disability Pain remains the main problem in this group, but activities of daily living are affected. These patients require a detailed investigation  61-80% Crippled Back pain impinges on all aspects of the patients life. Positive intervention is required  81-100% Bed-bound These  patients are either bed-bound or exaggerating their symptoms  Reference: Adelle SPEAK, [place] PB. The Oswestry Disability Index. Spine 2000 Nov 15;25(22):2940-52; discussion 2.  Minimum detectable change (90% confidence): 10% points  COGNITION: Overall cognitive status: Within functional limits for tasks assessed     SENSATION: WFL  MUSCLE LENGTH: Hamstrings:   POSTURE: No Significant postural limitations  PALPATION: No tenderness with sacral pressure  LUMBAR ROM:   AROM eval  Flexion Fingertips to mid shins  Extension 30% available *  Right lateral flexion   Left lateral flexion   Right rotation   Left rotation    (Blank rows = not tested)  LOWER EXTREMITY ROM:     Active  Right eval Left eval  Hip flexion    Hip extension    Hip abduction    Hip adduction    Hip internal rotation    Hip external rotation    Knee flexion    Knee extension    Ankle dorsiflexion    Ankle plantarflexion    Ankle inversion    Ankle eversion     (Blank rows = not tested)  LOWER EXTREMITY MMT:    MMT Right eval Left eval  Hip flexion 4+ 4+  Hip extension 4 4  Hip abduction    Hip adduction    Hip internal rotation    Hip external rotation    Knee flexion 4+ 4+  Knee extension 5 4+  Ankle dorsiflexion 5 5  Ankle plantarflexion    Ankle inversion    Ankle eversion     (Blank rows = not tested)  LUMBAR SPECIAL TESTS:  SI Compression/distraction test: Negative  FUNCTIONAL TESTS:  5 times sit to stand: 15.48 sec hands on thight  GAIT: Distance walked: 50 ft in clinic Assistive device utilized: None Level of assistance: Complete Independence Comments: no significant gait deviation noted  TREATMENT DATE:                                    03/23/24 EXERCISE LOG  Exercise Repetitions and Resistance Comments  Supine piriformis stretch  4 x 20 seconds each    Supine HS stretch  4 x 20 seconds each    Lower trunk rotation   2 minutes    Double knee to chest   2  minutes  LE supported on red ball   Supine hip ADD isometric  2.5 minutes w/ 5 second hold          Blank cell = exercise not performed today   03/15/24 physical therapy evaluation and HEP instruction                                                                                                                                 PATIENT EDUCATION:  Education details: Patient educated on HEP, healing, prognosis, and anatomy Person educated: Patient Education method: Programmer, Multimedia, Demonstration, and Handouts Education comprehension: verbalized understanding, returned demonstration, verbal cues required, and tactile cues required   HOME EXERCISE PROGRAM: Access Code: [email or web address] URL: [email or web address] Date: 03/23/2024 Prepared by: Lacinda  Josemanuel Eakins  Exercises - Supine Lower Trunk Rotation  - 1 x daily - 7 x weekly - 3 sets - 10 reps - Supine Double Knee to Chest  - 1 x daily - 7 x weekly - 5 sets - 20 seconds hold - Supine Hip Adduction Isometric with Ball  - 1 x daily - 7 x weekly - 3 sets - 10 reps - 5 seconds hold  Access Code: [email or web address] URL: [email or web address] Date: 03/15/2024 Prepared by: [facility]  Exercises - Supine Hamstring Stretch  - 2 x daily - 7 x weekly - 1 sets - 5 reps - 20 sec hold - Supine Figure 4 Piriformis Stretch  - 2 x daily - 7 x weekly - 1 sets - 5 reps - 20 sec hold  ASSESSMENT:  CLINICAL IMPRESSION: Patient was introduced to multiple new interventions for reduced pain and improved mobility. His HEP was reviewed and he required minimal cueing with these interventions for proper exercise performance to avoid aggravating his familiar symptoms. He experienced no significant increase in pain or discomfort with any of today's interventions. He reported feeling alright upon the conclusion of treatment. Patient continues to require skilled physical therapy to address her remaining impairments to return to her prior level of function.     Eval: Patient is a 68 y.o. male who was seen today for physical therapy evaluation and treatment for M53.3 (ICD-10-CM) - Sacrococcygeal disorders, not elsewhere classified. Patient demonstrates muscle weakness, reduced ROM, and fascial restrictions which are likely contributing to symptoms of pain and are negatively impacting patient ability to perform ADLs and functional mobility tasks. Patient will benefit from skilled physical therapy services to address these deficits to reduce pain and improve level of function with ADLs and functional mobility tasks.   OBJECTIVE IMPAIRMENTS: decreased activity tolerance, decreased ROM, decreased strength, increased fascial restrictions, and impaired perceived functional ability.   ACTIVITY LIMITATIONS: sitting and bed mobility  PARTICIPATION LIMITATIONS: driving  PERSONAL FACTORS: left knee meniscus tear; also awaiting neck fusion and history of back issues are also affecting patient's functional outcome.   REHAB POTENTIAL: Good  CLINICAL DECISION MAKING: Evolving/moderate complexity  EVALUATION COMPLEXITY: Moderate   GOALS: Goals reviewed with patient? No  SHORT TERM GOALS: Target date: 04/05/2024  patient will be independent with initial HEP and compliant with HEP 3-4 times a week   Baseline: Goal status: INITIAL  2.  Patient will report 50% improvement overall  Baseline:  Goal status: INITIAL  LONG TERM GOALS: Target date: 04/26/2024  Patient will be independent in self management strategies to improve quality of life and functional outcomes.  Baseline:  Goal status: INITIAL  2.  Patient will report 70% improvement overall  Baseline:  Goal status: INITIAL  3.  Patient will improve Modified Oswestry score by 6 points to demonstrate improved perceived function  Baseline: 19/50 Goal status: INITIAL  4.  Patient will improve 5 times sit to stand score to 12 sec or less to demonstrate improved functional mobility and  increased leg strength.    Baseline: 15.48 sec Goal status: INITIAL  5.  Patient will be able to sit x 1 hour to watch a movie or drive out of town without pain > 2/10  Baseline: 8/10 after sitting 30 min Goal status: INITIAL   PLAN:  PT FREQUENCY: 2x/week  PT DURATION: 6 weeks  PLANNED INTERVENTIONS: [id number]- PT Re-evaluation, 97110-Therapeutic exercises, 97530- Therapeutic activity, 97112- Neuromuscular re-education, 97535- Self Care, [id number]- Manual therapy, [id number]- Gait training, [phone]- Orthotic Fit/training,  04007- Canalith repositioning, 02886- Aquatic Therapy, [id number]- Splinting, 02402- Wound care (first 20 sq cm), 97598- Wound care (each additional 20 sq cm)Patient/Family education, Balance training, Stair training, Taping, Dry Needling, Joint mobilization, Joint manipulation, Spinal manipulation, Spinal mobilization, Scar mobilization, and DME instructions. SABRA  PLAN FOR NEXT SESSION: Review HEP and goals; stretching around sacrum, hips and coccyx; low back strengthening  Lacinda Fass, PT, DPT  3:38 PM, 03/23/2024  "

## 2024-03-30 ENCOUNTER — Ambulatory Visit (HOSPITAL_COMMUNITY): Attending: Family Medicine | Admitting: Physical Therapy

## 2024-03-30 DIAGNOSIS — M533 Sacrococcygeal disorders, not elsewhere classified: Secondary | ICD-10-CM | POA: Insufficient documentation

## 2024-03-30 DIAGNOSIS — M545 Low back pain, unspecified: Secondary | ICD-10-CM | POA: Diagnosis present

## 2024-03-30 NOTE — Therapy (Addendum)
 " OUTPATIENT PHYSICAL THERAPY THORACOLUMBAR TREATMENT   Patient Name: Andrew Fisher MRN: [id number] DOB:04/16/1955, 69 y.o., male Today's Date: 03/30/2024  END OF SESSION:  PT End of Session - 03/30/24 1344     Visit Number 3    Number of Visits 12    Date for Recertification  04/26/24    Authorization Type [employer]    Authorization Time Period no auth needed    Progress Note Due on Visit 10    PT Start Time 1248    PT Stop Time 1335    PT Time Calculation (min) 47 min    Activity Tolerance Patient tolerated treatment well    Behavior During Therapy WFL for tasks assessed/performed            Past Medical History:  Diagnosis Date   Arthritis    Asthma    GERD (gastroesophageal reflux disease)    Hearing loss    Hemorrhoids    Hyperlipidemia    Low testosterone  in male    Peripheral neuropathy    Sleep apnea    wears cpap   Sore throat    Wears glasses    Wears hearing aid in both ears    Past Surgical History:  Procedure Laterality Date   ANTERIOR CERVICAL DECOMP/DISCECTOMY FUSION N/A 01/09/2017   Procedure: ANTERIOR CERVICAL DECOMPRESSION FUSION, CERVICAL FOUR-FIVE, CERVICAL FIVE-SIX INSTRUMENTATION AND ALLOGRAFT;  Surgeon: Beuford Anes, MD;  Location: [facility] OR;  Service: Orthopedics;  Laterality: N/A;  ANTERIOR CERVICAL DECOMPRESSION FUSION, CERVICAL FOUR-FIVE, CERVICAL FIVE-SIX INSTRUMENTATION AND ALLOGRAFT   APPENDECTOMY     BIOPSY  11/12/2019   Procedure: BIOPSY;  Surgeon: Rollin Dover, MD;  Location: [facility];  Service: Endoscopy;;   CARPAL TUNNEL RELEASE Left    06/2022   CARPAL TUNNEL RELEASE Right    10/2022   COLONOSCOPY     ESOPHAGOGASTRODUODENOSCOPY (EGD) WITH PROPOFOL  N/A 11/12/2019   Procedure: ESOPHAGOGASTRODUODENOSCOPY (EGD) WITH PROPOFOL ;  Surgeon: Rollin Dover, MD;  Location: [facility];  Service: Endoscopy;  Laterality: N/A;   NECK SURGERY     SAVORY DILATION N/A 11/12/2019   Procedure: SAVORY  DILATION;  Surgeon: Rollin Dover, MD;  Location: [facility];  Service: Endoscopy;  Laterality: N/A;   SHOULDER SURGERY  2005   rotator cuff and tendons repaired   Patient Active Problem List   Diagnosis Date Noted   Coronary artery disease involving native coronary artery of native heart without angina pectoris 01/07/2024   Palpitations 09/23/2023   Thrombocytopenia 07/02/2023   Dietary iron deficiency without anemia 07/02/2023   Gastroesophageal reflux disease without esophagitis 07/02/2023   Atypical chest pain 07/02/2023   Chronic bilateral low back pain without sciatica 07/02/2023   Hypogonadism in male 07/02/2023   Essential hypertension, benign 01/01/2023   Other abnormal glucose 01/01/2023   Class 1 obesity due to excess calories with serious comorbidity and body mass index (BMI) of 30.0 to 30.9 in adult 01/01/2023   Hypokalemia 01/01/2023   Benign ethnic neutropenia 06/13/2022   Sickle cell trait 06/13/2022   Visit for TB skin test 05/04/2018   Pure hypercholesterolemia 03/26/2018   Family history of colon cancer 03/26/2018   Cough 03/26/2018   Allergic rhinitis due to allergen 03/26/2018   Hematoma of neck 01/11/2017   S/P cervical spinal fusion 01/11/2017   Radiculopathy 01/09/2017   Hemorrhoids, internal 12/11/2010    PCP: Jarold Medici, MD  REFERRING PROVIDER: Volanda Saupe, MD  REFERRING DIAG: M53.3 (ICD-10-CM) - Sacrococcygeal disorders, not elsewhere  classified  Rationale for Evaluation and Treatment: Rehabilitation  THERAPY DIAG:  Low back pain, unspecified back pain laterality, unspecified chronicity, unspecified whether sciatica present  Coccyx pain  ONSET DATE: 8 months ago  SUBJECTIVE:                                                                                                                                                                                           SUBJECTIVE STATEMENT: Patient reports laying down is most painful and  pain increases with prolonged standing.  Currently his pain is 4/10 and mostly in buttocks.     Eval: Late check in; His neighbor fell about 8 months ago and he was trying to help her up and he fell.  [facility] gave [facility] pain medication but he is still having pain the most with sitting and laying down; he does get some relief with sleeping on his side.    PERTINENT HISTORY:  CPAP Cervical fusion 2018 Sickle cell trait Awaiting left knee surgery and cervical fusion  PAIN:  Are you having pain? Yes: NPRS scale: 4/10 Pain location: coccyx, hamstrings, adductors Pain description: sore Aggravating factors: sitting, laying supine Relieving factors: movement, medication  PRECAUTIONS: None  RED FLAGS: None   WEIGHT BEARING RESTRICTIONS: No  FALLS:  Has patient fallen in last 6 months? Yes. Number of falls 1   OCCUPATION: retired veteran  PLOF: Independent  PATIENT GOALS: less pain  NEXT MD VISIT: PRN  OBJECTIVE:  Note: Objective measures were completed at Evaluation unless otherwise noted.  DIAGNOSTIC FINDINGS:    PATIENT SURVEYS:  Oswestry Low Back Pain Disability Questionnaire:  OSWESTRY LBP DISABILITY Questionnaire  Date: 03/15/2024 Score  Total 19/50; 38%   Interpretation of scores: Score Category Description  0-20% Minimal Disability The patient can cope with most living activities. Usually no treatment is indicated apart from advice on lifting, sitting and exercise  21-40% Moderate Disability The patient experiences more pain and difficulty with sitting, lifting and standing. Travel and social life are more difficult and they may be disabled from work. Personal care, sexual activity and sleeping are not grossly affected, and the patient can usually be managed by conservative means  41-60% Severe Disability Pain remains the main problem in this group, but activities of daily living are affected. These patients require a detailed investigation  61-80% Crippled Back pain  impinges on all aspects of the patients life. Positive intervention is required  81-100% Bed-bound These patients are either bed-bound or exaggerating their symptoms  Reference: Adelle SPEAK, P[place]sent PB. The Oswestry Disability Index. Spine 2000 Nov 15;25(22):2940-52; discussion 88.  Minimum detectable change (90% confidence): 10% points  COGNITION: Overall cognitive status: Within functional limits for tasks assessed     SENSATION: WFL  MUSCLE LENGTH: Hamstrings:   POSTURE: No Significant postural limitations  PALPATION: No tenderness with sacral pressure  LUMBAR ROM:   AROM eval  Flexion Fingertips to mid shins  Extension 30% available *  Right lateral flexion   Left lateral flexion   Right rotation   Left rotation    (Blank rows = not tested)  LOWER EXTREMITY ROM:     Active  Right eval Left eval  Hip flexion    Hip extension    Hip abduction    Hip adduction    Hip internal rotation    Hip external rotation    Knee flexion    Knee extension    Ankle dorsiflexion    Ankle plantarflexion    Ankle inversion    Ankle eversion     (Blank rows = not tested)  LOWER EXTREMITY MMT:    MMT Right eval Left eval  Hip flexion 4+ 4+  Hip extension 4 4  Hip abduction    Hip adduction    Hip internal rotation    Hip external rotation    Knee flexion 4+ 4+  Knee extension 5 4+  Ankle dorsiflexion 5 5  Ankle plantarflexion    Ankle inversion    Ankle eversion     (Blank rows = not tested)  LUMBAR SPECIAL TESTS:  SI Compression/distraction test: Negative  FUNCTIONAL TESTS:  5 times sit to stand: 15.48 sec hands on thight  GAIT: Distance walked: 50 ft in clinic Assistive device utilized: None Level of assistance: Complete Independence Comments: no significant gait deviation noted  TREATMENT DATE:  03/30/24 Standing:  hamstring stretch using chair 30 each Seated: hamstring stretch long sitting 30 each Supine:  TA abdominal isometrics  10X3  Bridge 10X each with 3 holds  Lower trunk rotation 10X3  Single knee to chest 5X10                                     03/23/24 EXERCISE LOG  Exercise Repetitions and Resistance Comments  Supine piriformis stretch  4 x 20 seconds each    Supine HS stretch  4 x 20 seconds each    Lower trunk rotation   2 minutes    Double knee to chest   2 minutes  LE supported on red ball   Supine hip ADD isometric  2.5 minutes w/ 5 second hold          Blank cell = exercise not performed today   03/15/24 physical therapy evaluation and HEP instruction                                                                                                                                 PATIENT EDUCATION:  Education details: Patient educated on  HEP, healing, prognosis, and anatomy Person educated: Patient Education method: Explanation, Demonstration, and Handouts Education comprehension: verbalized understanding, returned demonstration, verbal cues required, and tactile cues required   HOME EXERCISE PROGRAM: Access Code: [email or web address] URL: [email or web address] Date: 03/23/2024 Prepared by: Lacinda Fass Exercises - Supine Lower Trunk Rotation  - 1 x daily - 7 x weekly - 3 sets - 10 reps - Supine Double Knee to Chest  - 1 x daily - 7 x weekly - 5 sets - 20 seconds hold - Supine Hip Adduction Isometric with Ball  - 1 x daily - 7 x weekly - 3 sets - 10 reps - 5 seconds hold  Access Code: [email or web address] URL: [email or web address] Date: 03/15/2024 Prepared by: AP - Rehab Exercises - Supine Hamstring Stretch  - 2 x daily - 7 x weekly - 1 sets - 5 reps - 20 sec hold - Supine Figure 4 Piriformis Stretch  - 2 x daily - 7 x weekly - 1 sets - 5 reps - 20 sec hold  Access Code: [email or web address] URL: [email or web address] Date: 03/30/2024 Prepared by: Greig Fuse Exercises - Supine Transversus Abdominis Bracing - Hands on Stomach  - 2 x daily - 7 x weekly - 10 reps - 5  sec hold - Beginner Bridge  - 2 x daily - 7 x weekly - 10 reps - 5 sec hold - Seated Table Hamstring Stretch  - 2 x daily - 7 x weekly - 3 reps - 30 sec hold  ASSESSMENT:  CLINICAL IMPRESSION: Educated pt on importance of core stabilization and initiated isometric engagement of core.  Hamstring stretch instructed in both standing and seated position.  Pt preferred seated and was able to acquire a good stretch.  Began glut strengthening with addition of bridge.  Pt with more comfort completing single knee to chest vs double.  Pt with positive results with added exercises today and requested printouts for home.  Pt reported pain reduction at end of session. Patient continues to require skilled physical therapy to address her remaining impairments to return to her prior level of function.    Eval: Patient is a 69 y.o. male who was seen today for physical therapy evaluation and treatment for M53.3 (ICD-10-CM) - Sacrococcygeal disorders, not elsewhere classified. Patient demonstrates muscle weakness, reduced ROM, and fascial restrictions which are likely contributing to symptoms of pain and are negatively impacting patient ability to perform ADLs and functional mobility tasks. Patient will benefit from skilled physical therapy services to address these deficits to reduce pain and improve level of function with ADLs and functional mobility tasks.   OBJECTIVE IMPAIRMENTS: decreased activity tolerance, decreased ROM, decreased strength, increased fascial restrictions, and impaired perceived functional ability.   ACTIVITY LIMITATIONS: sitting and bed mobility  PARTICIPATION LIMITATIONS: driving  PERSONAL FACTORS: left knee meniscus tear; also awaiting neck fusion and history of back issues are also affecting patient's functional outcome.   REHAB POTENTIAL: Good  CLINICAL DECISION MAKING: Evolving/moderate complexity  EVALUATION COMPLEXITY: Moderate   GOALS: Goals reviewed with patient? No  SHORT  TERM GOALS: Target date: 04/05/2024  patient will be independent with initial HEP and compliant with HEP 3-4 times a week   Baseline: Goal status: INITIAL  2.  Patient will report 50% improvement overall  Baseline:  Goal status: INITIAL  LONG TERM GOALS: Target date: 04/26/2024  Patient will be independent in self management strategies to improve quality of life and functional outcomes.  Baseline:  Goal status: INITIAL  2.  Patient will report 70% improvement  overall  Baseline:  Goal status: INITIAL  3.  Patient will improve Modified Oswestry score by 6 points to demonstrate improved perceived function  Baseline: 19/50 Goal status: INITIAL  4.  Patient will improve 5 times sit to stand score to 12 sec or less to demonstrate improved functional mobility and increased leg strength.    Baseline: 15.48 sec Goal status: INITIAL  5.  Patient will be able to sit x 1 hour to watch a movie or drive out of town without pain > 2/10  Baseline: 8/10 after sitting 30 min Goal status: INITIAL   PLAN:  PT FREQUENCY: 2x/week  PT DURATION: 6 weeks  PLANNED INTERVENTIONS: 97164- PT Re-evaluation, 97110-Therapeutic exercises, 97530- Therapeutic activity, 97112- Neuromuscular re-education, 97535- Self Care, [id number]- Manual therapy, [id number]- Gait training, [phone]- Orthotic Fit/training, [phone]- Canalith repositioning, [id number]- Aquatic Therapy, 97760- Splinting, [phone]- Wound care (first 20 sq cm), 97598- Wound care (each additional 20 sq cm)Patient/Family education, Balance training, Stair training, Taping, Dry Needling, Joint mobilization, Joint manipulation, Spinal manipulation, Spinal mobilization, Scar mobilization, and DME instructions. SABRA  PLAN FOR NEXT SESSION: Review HEP and goals; stretching around sacrum, hips and coccyx; low back strengthening  Greig KATHEE Fuse, PTA/CLT [facility] Ph: [phone] 1:45 PM, 03/30/2024  "

## 2024-04-02 ENCOUNTER — Ambulatory Visit (HOSPITAL_COMMUNITY)

## 2024-04-02 DIAGNOSIS — M545 Low back pain, unspecified: Secondary | ICD-10-CM

## 2024-04-02 DIAGNOSIS — M533 Sacrococcygeal disorders, not elsewhere classified: Secondary | ICD-10-CM

## 2024-04-02 NOTE — Therapy (Signed)
 " OUTPATIENT PHYSICAL THERAPY THORACOLUMBAR TREATMENT   Patient Name: Andrew Fisher MRN: [id number] DOB:10/12/1955, 69 y.o., male Today's Date: 04/02/2024  END OF SESSION:  PT End of Session - 04/02/24 1336     Visit Number 4    Number of Visits 12    Date for Recertification  04/26/24    Authorization Type [employer] medic[employer]    Authorization Time Period no auth needed    Progress Note Due on Visit 10    PT Start Time 1335    PT Stop Time 1415    PT Time Calculation (min) 40 min    Activity Tolerance Patient tolerated treatment well    Behavior During Therapy WFL for tasks assessed/performed            Past Medical History:  Diagnosis Date   Arthritis    Asthma    GERD (gastroesophageal reflux disease)    Hearing loss    Hemorrhoids    Hyperlipidemia    Low testosterone  in male    Peripheral neuropathy    Sleep apnea    wears cpap   Sore throat    Wears glasses    Wears hearing aid in both ears    Past Surgical History:  Procedure Laterality Date   ANTERIOR CERVICAL DECOMP/DISCECTOMY FUSION N/A 01/09/2017   Procedure: ANTERIOR CERVICAL DECOMPRESSION FUSION, CERVICAL FOUR-FIVE, CERVICAL FIVE-SIX INSTRUMENTATION AND ALLOGRAFT;  Surgeon: Beuford Anes, MD;  Location: [facility] OR;  Service: Orthopedics;  Laterality: N/A;  ANTERIOR CERVICAL DECOMPRESSION FUSION, CERVICAL FOUR-FIVE, CERVICAL FIVE-SIX INSTRUMENTATION AND ALLOGRAFT   APPENDECTOMY     BIOPSY  11/12/2019   Procedure: BIOPSY;  Surgeon: Rollin Dover, MD;  Location: [facility];  Service: Endoscopy;;   CARPAL TUNNEL RELEASE Left    06/2022   CARPAL TUNNEL RELEASE Right    10/2022   COLONOSCOPY     ESOPHAGOGASTRODUODENOSCOPY (EGD) WITH PROPOFOL  N/A 11/12/2019   Procedure: ESOPHAGOGASTRODUODENOSCOPY (EGD) WITH PROPOFOL ;  Surgeon: Rollin Dover, MD;  Location: [facility];  Service: Endoscopy;  Laterality: N/A;   NECK SURGERY     SAVORY DILATION N/A 11/12/2019   Procedure: SAVORY  DILATION;  Surgeon: Rollin Dover, MD;  Location: [facility];  Service: Endoscopy;  Laterality: N/A;   SHOULDER SURGERY  2005   rotator cuff and tendons repaired   Patient Active Problem List   Diagnosis Date Noted   Coronary artery disease involving native coronary artery of native heart without angina pectoris 01/07/2024   Palpitations 09/23/2023   Thrombocytopenia 07/02/2023   Dietary iron deficiency without anemia 07/02/2023   Gastroesophageal reflux disease without esophagitis 07/02/2023   Atypical chest pain 07/02/2023   Chronic bilateral low back pain without sciatica 07/02/2023   Hypogonadism in male 07/02/2023   Essential hypertension, benign 01/01/2023   Other abnormal glucose 01/01/2023   Class 1 obesity due to excess calories with serious comorbidity and body mass index (BMI) of 30.0 to 30.9 in adult 01/01/2023   Hypokalemia 01/01/2023   Benign ethnic neutropenia 06/13/2022   Sickle cell trait 06/13/2022   Visit for TB skin test 05/04/2018   Pure hypercholesterolemia 03/26/2018   Family history of colon cancer 03/26/2018   Cough 03/26/2018   Allergic rhinitis due to allergen 03/26/2018   Hematoma of neck 01/11/2017   S/P cervical spinal fusion 01/11/2017   Radiculopathy 01/09/2017   Hemorrhoids, internal 12/11/2010    PCP: Jarold Medici, MD  REFERRING PROVIDER: Volanda Saupe, MD  REFERRING DIAG: M53.3 (ICD-10-CM) - Sacrococcygeal disorders, not elsewhere  classified  Rationale for Evaluation and Treatment: Rehabilitation  THERAPY DIAG:  Low back pain, unspecified back pain laterality, unspecified chronicity, unspecified whether sciatica present  Coccyx pain  ONSET DATE: 8 months ago  SUBJECTIVE:                                                                                                                                                                                           SUBJECTIVE STATEMENT:  I can see a little improvement; able to ride  with wife to the airport and back today.  0/10 pain on arrival.  Did have a little pain with riding back from the airport; has appt Monday with spine surgeon.    Eval: Late check in; His neighbor fell about 8 months ago and he was trying to help her up and he fell.  [facility] gave [facility] pain medication but he is still having pain the most with sitting and laying down; he does get some relief with sleeping on his side.    PERTINENT HISTORY:  CPAP Cervical fusion 2018 Sickle cell trait Awaiting left knee surgery and cervical fusion  PAIN:  Are you having pain? Yes: NPRS scale: 4/10 Pain location: coccyx, hamstrings, adductors Pain description: sore Aggravating factors: sitting, laying supine Relieving factors: movement, medication  PRECAUTIONS: None  RED FLAGS: None   WEIGHT BEARING RESTRICTIONS: No  FALLS:  Has patient fallen in last 6 months? Yes. Number of falls 1   OCCUPATION: retired veteran  PLOF: Independent  PATIENT GOALS: less pain  NEXT MD VISIT: PRN  OBJECTIVE:  Note: Objective measures were completed at Evaluation unless otherwise noted.  DIAGNOSTIC FINDINGS:    PATIENT SURVEYS:  Oswestry Low Back Pain Disability Questionnaire:  OSWESTRY LBP DISABILITY Questionnaire  Date: 03/15/2024 Score  Total 19/50; 38%   Interpretation of scores: Score Category Description  0-20% Minimal Disability The patient can cope with most living activities. Usually no treatment is indicated apart from advice on lifting, sitting and exercise  21-40% Moderate Disability The patient experiences more pain and difficulty with sitting, lifting and standing. Travel and social life are more difficult and they may be disabled from work. Personal care, sexual activity and sleeping are not grossly affected, and the patient can usually be managed by conservative means  41-60% Severe Disability Pain remains the main problem in this group, but activities of daily living are affected. These patients  require a detailed investigation  61-80% Crippled Back pain impinges on all aspects of the patients life. Positive intervention is required  81-100% Bed-bound These patients are either bed-bound or exaggerating their symptoms  Reference: Adelle SPEAK, [place] PB. The  Oswestry Disability Index. Spine 2000 Nov 15;25(22):2940-52; discussion 34.  Minimum detectable change (90% confidence): 10% points   COGNITION: Overall cognitive status: Within functional limits for tasks assessed     SENSATION: WFL  MUSCLE LENGTH: Hamstrings:   POSTURE: No Significant postural limitations  PALPATION: No tenderness with sacral pressure  LUMBAR ROM:   AROM eval  Flexion Fingertips to mid shins  Extension 30% available *  Right lateral flexion   Left lateral flexion   Right rotation   Left rotation    (Blank rows = not tested)  LOWER EXTREMITY ROM:     Active  Right eval Left eval  Hip flexion    Hip extension    Hip abduction    Hip adduction    Hip internal rotation    Hip external rotation    Knee flexion    Knee extension    Ankle dorsiflexion    Ankle plantarflexion    Ankle inversion    Ankle eversion     (Blank rows = not tested)  LOWER EXTREMITY MMT:    MMT Right eval Left eval  Hip flexion 4+ 4+  Hip extension 4 4  Hip abduction    Hip adduction    Hip internal rotation    Hip external rotation    Knee flexion 4+ 4+  Knee extension 5 4+  Ankle dorsiflexion 5 5  Ankle plantarflexion    Ankle inversion    Ankle eversion     (Blank rows = not tested)  LUMBAR SPECIAL TESTS:  SI Compression/distraction test: Negative  FUNCTIONAL TESTS:  5 times sit to stand: 15.48 sec hands on thight  GAIT: Distance walked: 50 ft in clinic Assistive device utilized: None Level of assistance: Complete Independence Comments: no significant gait deviation noted  TREATMENT DATE:  04/02/2024 TM 1.2 mph with focus on improved heel strike Standing lumbar extension over the  bar x 10 Hamstring stretch 5 x 20 each standing using 8 box Hamstring stretch seated 5 x 20 each long sitting Supine: LTR x 10 each way Bridge x 10  03/30/24 Standing:  hamstring stretch using chair 30 each Seated: hamstring stretch long sitting 30 each Supine:  TA abdominal isometrics 10X3  Bridge 10X each with 3 holds  Lower trunk rotation 10X3  Single knee to chest 5X10                                     03/23/24 EXERCISE LOG  Exercise Repetitions and Resistance Comments  Supine piriformis stretch  4 x 20 seconds each    Supine HS stretch  4 x 20 seconds each    Lower trunk rotation   2 minutes    Double knee to chest   2 minutes  LE supported on red ball   Supine hip ADD isometric  2.5 minutes w/ 5 second hold          Blank cell = exercise not performed today   03/15/24 physical therapy evaluation and HEP instruction  PATIENT EDUCATION:  Education details: Patient educated on HEP, healing, prognosis, and anatomy Person educated: Patient Education method: Explanation, Demonstration, and Handouts Education comprehension: verbalized understanding, returned demonstration, verbal cues required, and tactile cues required   HOME EXERCISE PROGRAM: Access Code: [email or web address] URL: [email or web address] Date: 03/23/2024 Prepared by: Lacinda Fass Exercises - Supine Lower Trunk Rotation  - 1 x daily - 7 x weekly - 3 sets - 10 reps - Supine Double Knee to Chest  - 1 x daily - 7 x weekly - 5 sets - 20 seconds hold - Supine Hip Adduction Isometric with Ball  - 1 x daily - 7 x weekly - 3 sets - 10 reps - 5 seconds hold  Access Code: [email or web address] URL: [email or web address] Date: 03/15/2024 Prepared by: [facility] - Rehab Exercises - Supine Hamstring Stretch  - 2 x daily - 7 x weekly - 1 sets - 5 reps - 20 sec hold - Supine Figure  4 Piriformis Stretch  - 2 x daily - 7 x weekly - 1 sets - 5 reps - 20 sec hold  Access Code: [email or web address] URL: [email or web address] Date: 03/30/2024 Prepared by: Greig Fuse Exercises - Supine Transversus Abdominis Bracing - Hands on Stomach  - 2 x daily - 7 x weekly - 10 reps - 5 sec hold - Beginner Bridge  - 2 x daily - 7 x weekly - 10 reps - 5 sec hold - Seated Table Hamstring Stretch  - 2 x daily - 7 x weekly - 3 reps - 30 sec hold  ASSESSMENT:  CLINICAL IMPRESSION: No pain on arrival.   Patient tends to walk with trunk flexed so added lumbar extension over the // bars.  Needed cues on the TM for improved heel strike. Able to perform bridge without pain.  Needs reminders to engage abdominals before bridge.  No complaints of pain during treatment today.    Patient continues to require skilled physical therapy to address her remaining impairments to return to his prior level of function.    Eval: Patient is a 69 y.o. male who was seen today for physical therapy evaluation and treatment for M53.3 (ICD-10-CM) - Sacrococcygeal disorders, not elsewhere classified. Patient demonstrates muscle weakness, reduced ROM, and fascial restrictions which are likely contributing to symptoms of pain and are negatively impacting patient ability to perform ADLs and functional mobility tasks. Patient will benefit from skilled physical therapy services to address these deficits to reduce pain and improve level of function with ADLs and functional mobility tasks.   OBJECTIVE IMPAIRMENTS: decreased activity tolerance, decreased ROM, decreased strength, increased fascial restrictions, and impaired perceived functional ability.   ACTIVITY LIMITATIONS: sitting and bed mobility  PARTICIPATION LIMITATIONS: driving  PERSONAL FACTORS: left knee meniscus tear; also awaiting neck fusion and history of back issues are also affecting patient's functional outcome.   REHAB POTENTIAL: Good  CLINICAL DECISION  MAKING: Evolving/moderate complexity  EVALUATION COMPLEXITY: Moderate   GOALS: Goals reviewed with patient? No  SHORT TERM GOALS: Target date: 04/05/2024  patient will be independent with initial HEP and compliant with HEP 3-4 times a week   Baseline: Goal status: INITIAL  2.  Patient will report 50% improvement overall  Baseline:  Goal status: INITIAL  LONG TERM GOALS: Target date: 04/26/2024  Patient will be independent in self management strategies to improve quality of life and functional outcomes.  Baseline:  Goal status: INITIAL  2.  Patient will report 70% improvement overall  Baseline:  Goal status: INITIAL  3.  Patient will improve Modified  Oswestry score by 6 points to demonstrate improved perceived function  Baseline: 19/50 Goal status: INITIAL  4.  Patient will improve 5 times sit to stand score to 12 sec or less to demonstrate improved functional mobility and increased leg strength.    Baseline: 15.48 sec Goal status: INITIAL  5.  Patient will be able to sit x 1 hour to watch a movie or drive out of town without pain > 2/10  Baseline: 8/10 after sitting 30 min Goal status: INITIAL   PLAN:  PT FREQUENCY: 2x/week  PT DURATION: 6 weeks  PLANNED INTERVENTIONS: 97164- PT Re-evaluation, 97110-Therapeutic exercises, 97530- Therapeutic activity, 97112- Neuromuscular re-education, 97535- Self Care, [id number]- Manual therapy, [id number]- Gait training, [phone]- Orthotic Fit/training, [phone]- Canalith repositioning, [id number]- Aquatic Therapy, 97760- Splinting, [phone]- Wound care (first 20 sq cm), 97598- Wound care (each additional 20 sq cm)Patient/Family education, Balance training, Stair training, Taping, Dry Needling, Joint mobilization, Joint manipulation, Spinal manipulation, Spinal mobilization, Scar mobilization, and DME instructions. SABRA  PLAN FOR NEXT SESSION: Review HEP and goals; stretching around sacrum, hips and coccyx; low back strengthening  2:16 PM,  04/02/2024 Andrew Fisher MPT [facility] [place] [phone] [phone]  "

## 2024-04-07 ENCOUNTER — Ambulatory Visit (HOSPITAL_COMMUNITY)

## 2024-04-09 ENCOUNTER — Encounter (HOSPITAL_COMMUNITY): Payer: Self-pay

## 2024-04-09 ENCOUNTER — Ambulatory Visit (HOSPITAL_COMMUNITY)

## 2024-04-09 DIAGNOSIS — M533 Sacrococcygeal disorders, not elsewhere classified: Secondary | ICD-10-CM

## 2024-04-09 DIAGNOSIS — M545 Low back pain, unspecified: Secondary | ICD-10-CM

## 2024-04-09 NOTE — Therapy (Signed)
 " OUTPATIENT PHYSICAL THERAPY THORACOLUMBAR TREATMENT   Patient Name: Andrew Fisher MRN: [id number] DOB:1956-02-02, 69 y.o., male Today's Date: 04/09/2024  END OF SESSION:  PT End of Session - 04/09/24 1326     Visit Number 5    Number of Visits 12    Date for Recertification  04/26/24    Authorization Type [employer]    Authorization Time Period no auth needed    Progress Note Due on Visit 10    PT Start Time 1328    PT Stop Time 1415    PT Time Calculation (min) 47 min    Activity Tolerance Patient tolerated treatment well    Behavior During Therapy WFL for tasks assessed/performed             Past Medical History:  Diagnosis Date   Arthritis    Asthma    GERD (gastroesophageal reflux disease)    Hearing loss    Hemorrhoids    Hyperlipidemia    Low testosterone  in male    Peripheral neuropathy    Sleep apnea    wears cpap   Sore throat    Wears glasses    Wears hearing aid in both ears    Past Surgical History:  Procedure Laterality Date   ANTERIOR CERVICAL DECOMP/DISCECTOMY FUSION N/A 01/09/2017   Procedure: ANTERIOR CERVICAL DECOMPRESSION FUSION, CERVICAL FOUR-FIVE, CERVICAL FIVE-SIX INSTRUMENTATION AND ALLOGRAFT;  Surgeon: Beuford Anes, MD;  Location: [facility] OR;  Service: Orthopedics;  Laterality: N/A;  ANTERIOR CERVICAL DECOMPRESSION FUSION, CERVICAL FOUR-FIVE, CERVICAL FIVE-SIX INSTRUMENTATION AND ALLOGRAFT   APPENDECTOMY     BIOPSY  11/12/2019   Procedure: BIOPSY;  Surgeon: Rollin Dover, MD;  Location: [facility];  Service: Endoscopy;;   CARPAL TUNNEL RELEASE Left    06/2022   CARPAL TUNNEL RELEASE Right    10/2022   COLONOSCOPY     ESOPHAGOGASTRODUODENOSCOPY (EGD) WITH PROPOFOL  N/A 11/12/2019   Procedure: ESOPHAGOGASTRODUODENOSCOPY (EGD) WITH PROPOFOL ;  Surgeon: Rollin Dover, MD;  Location: [facility];  Service: Endoscopy;  Laterality: N/A;   NECK SURGERY     SAVORY DILATION N/A 11/12/2019   Procedure: SAVORY  DILATION;  Surgeon: Rollin Dover, MD;  Location: [facility];  Service: Endoscopy;  Laterality: N/A;   SHOULDER SURGERY  2005   rotator cuff and tendons repaired   Patient Active Problem List   Diagnosis Date Noted   Coronary artery disease involving native coronary artery of native heart without angina pectoris 01/07/2024   Palpitations 09/23/2023   Thrombocytopenia 07/02/2023   Dietary iron deficiency without anemia 07/02/2023   Gastroesophageal reflux disease without esophagitis 07/02/2023   Atypical chest pain 07/02/2023   Chronic bilateral low back pain without sciatica 07/02/2023   Hypogonadism in male 07/02/2023   Essential hypertension, benign 01/01/2023   Other abnormal glucose 01/01/2023   Class 1 obesity due to excess calories with serious comorbidity and body mass index (BMI) of 30.0 to 30.9 in adult 01/01/2023   Hypokalemia 01/01/2023   Benign ethnic neutropenia 06/13/2022   Sickle cell trait 06/13/2022   Visit for TB skin test 05/04/2018   Pure hypercholesterolemia 03/26/2018   Family history of colon cancer 03/26/2018   Cough 03/26/2018   Allergic rhinitis due to allergen 03/26/2018   Hematoma of neck 01/11/2017   S/P cervical spinal fusion 01/11/2017   Radiculopathy 01/09/2017   Hemorrhoids, internal 12/11/2010    PCP: Jarold Medici, MD  REFERRING PROVIDER: Volanda Saupe, MD  REFERRING DIAG: M53.3 (ICD-10-CM) - Sacrococcygeal disorders, not  elsewhere classified  Rationale for Evaluation and Treatment: Rehabilitation  THERAPY DIAG:  Low back pain, unspecified back pain laterality, unspecified chronicity, unspecified whether sciatica present  Coccyx pain  ONSET DATE: 8 months ago  SUBJECTIVE:                                                                                                                                                                                           SUBJECTIVE STATEMENT: Patient reports that he feels good today and he is  not hurting. He had a good workout after his last appointment.   Eval: Late check in; His neighbor fell about 8 months ago and he was trying to help her up and he fell.  [facility] gave [facility] pain medication but he is still having pain the most with sitting and laying down; he does get some relief with sleeping on his side.    PERTINENT HISTORY:  CPAP Cervical fusion 2018 Sickle cell trait Awaiting left knee surgery and cervical fusion  PAIN:  Are you having pain? Yes: NPRS scale: 0/10 Pain location: coccyx, hamstrings, adductors Pain description: sore Aggravating factors: sitting, laying supine Relieving factors: movement, medication  PRECAUTIONS: None  RED FLAGS: None   WEIGHT BEARING RESTRICTIONS: No  FALLS:  Has patient fallen in last 6 months? Yes. Number of falls 1   OCCUPATION: retired veteran  PLOF: Independent  PATIENT GOALS: less pain  NEXT MD VISIT: PRN  OBJECTIVE:  Note: Objective measures were completed at Evaluation unless otherwise noted.  DIAGNOSTIC FINDINGS:    PATIENT SURVEYS:  Oswestry Low Back Pain Disability Questionnaire:  OSWESTRY LBP DISABILITY Questionnaire  Date: 03/15/2024 Score  Total 19/50; 38%   Interpretation of scores: Score Category Description  0-20% Minimal Disability The patient can cope with most living activities. Usually no treatment is indicated apart from advice on lifting, sitting and exercise  21-40% Moderate Disability The patient experiences more pain and difficulty with sitting, lifting and standing. Travel and social life are more difficult and they may be disabled from work. Personal care, sexual activity and sleeping are not grossly affected, and the patient can usually be managed by conservative means  41-60% Severe Disability Pain remains the main problem in this group, but activities of daily living are affected. These patients require a detailed investigation  61-80% Crippled Back pain impinges on all aspects of the  patients life. Positive intervention is required  81-100% Bed-bound These patients are either bed-bound or exaggerating their symptoms  Reference: Adelle SPEAK, [place] PB. The Oswestry Disability Index. Spine 2000 Nov 15;25(22):2940-52; discussion 80.  Minimum detectable change (90% confidence): 10% points   COGNITION:  Overall cognitive status: Within functional limits for tasks assessed     SENSATION: WFL  MUSCLE LENGTH: Hamstrings:   POSTURE: No Significant postural limitations  PALPATION: No tenderness with sacral pressure  LUMBAR ROM:   AROM eval  Flexion Fingertips to mid shins  Extension 30% available *  Right lateral flexion   Left lateral flexion   Right rotation   Left rotation    (Blank rows = not tested)  LOWER EXTREMITY ROM:     Active  Right eval Left eval  Hip flexion    Hip extension    Hip abduction    Hip adduction    Hip internal rotation    Hip external rotation    Knee flexion    Knee extension    Ankle dorsiflexion    Ankle plantarflexion    Ankle inversion    Ankle eversion     (Blank rows = not tested)  LOWER EXTREMITY MMT:    MMT Right eval Left eval  Hip flexion 4+ 4+  Hip extension 4 4  Hip abduction    Hip adduction    Hip internal rotation    Hip external rotation    Knee flexion 4+ 4+  Knee extension 5 4+  Ankle dorsiflexion 5 5  Ankle plantarflexion    Ankle inversion    Ankle eversion     (Blank rows = not tested)  LUMBAR SPECIAL TESTS:  SI Compression/distraction test: Negative  FUNCTIONAL TESTS:  5 times sit to stand: 15.48 sec hands on thight  GAIT: Distance walked: 50 ft in clinic Assistive device utilized: None Level of assistance: Complete Independence Comments: no significant gait deviation noted  TREATMENT DATE:                                    04/09/24 EXERCISE LOG  Exercise Repetitions and Resistance Comments  Treadmill  2.0 mph @ 2% incline x 5 minutes    Standing lumbar extension   15  reps  Over parallel bars   Supine HS stretch  3 x 30 seconds each    Pallof press  GTB x 20 reps each    Resisted pull down   GTB x 20 reps    Standing ball roll out   2 minutes  Multidirectional   Patient education  HEP, activity modification, healing, and progress with physical therapy     Blank cell = exercise not performed today   04/02/2024 TM 1.2 mph with focus on improved heel strike Standing lumbar extension over the bar x 10 Hamstring stretch 5 x 20 each standing using 8 box Hamstring stretch seated 5 x 20 each long sitting Supine: LTR x 10 each way Bridge x 10  03/30/24 Standing:  hamstring stretch using chair 30 each Seated: hamstring stretch long sitting 30 each Supine:  TA abdominal isometrics 10X3  Bridge 10X each with 3 holds  Lower trunk rotation 10X3  Single knee to chest 5X10  PATIENT EDUCATION:  Education details: Patient educated on HEP, healing, prognosis, and anatomy Person educated: Patient Education method: Programmer, Multimedia, Demonstration, and Handouts Education comprehension: verbalized understanding, returned demonstration, verbal cues required, and tactile cues required   HOME EXERCISE PROGRAM: Access Code: [email or web address] URL: [email or web address] Date: 04/09/2024 Prepared by: Lacinda Fass  Exercises - Standing Lumbar Spine Flexion Stretch Counter  - 1 x daily - 7 x weekly - 2 sets - 10 reps - Supine Hamstring Stretch  with Strap  - 1 x daily - 7 x weekly - 3 sets - 30 seconds hold - Standing Lumbar Extension with Counter  - 1 x daily - 7 x weekly - 3 sets - 10 reps  Access Code: [email or web address] URL: [email or web address] Date: 03/23/2024 Prepared by: Lacinda Fass Exercises - Supine Lower Trunk Rotation  - 1 x daily - 7 x weekly - 3 sets - 10 reps - Supine Double Knee to Chest  - 1 x daily - 7 x weekly - 5 sets - 20 seconds hold - Supine Hip Adduction Isometric with Ball  - 1 x daily - 7 x weekly - 3 sets - 10 reps - 5  seconds hold  Access Code: [email or web address] URL: [email or web address] Date: 03/15/2024 Prepared by: [facility] - Rehab Exercises - Supine Hamstring Stretch  - 2 x daily - 7 x weekly - 1 sets - 5 reps - 20 sec hold - Supine Figure 4 Piriformis Stretch  - 2 x daily - 7 x weekly - 1 sets - 5 reps - 20 sec hold  Access Code: [email or web address] URL: [email or web address] Date: 03/30/2024 Prepared by: Greig Fuse Exercises - Supine Transversus Abdominis Bracing - Hands on Stomach  - 2 x daily - 7 x weekly - 10 reps - 5 sec hold - Beginner Bridge  - 2 x daily - 7 x weekly - 10 reps - 5 sec hold - Seated Table Hamstring Stretch  - 2 x daily - 7 x weekly - 3 reps - 30 sec hold  ASSESSMENT:  CLINICAL IMPRESSION: Patient was progressed with multiple new interventions for improved lumbar stability. He required minimal cueing with today's new interventions for proper biomechanics. He noticed reduced stiffness following the supine hamstring stretch and standing ball roll out.  He reported feeling good upon the conclusion of treatment. Patient continues to require skilled physical therapy to address their remaining impairments to return to her prior level of function.    Eval: Patient is a 69 y.o. male who was seen today for physical therapy evaluation and treatment for M53.3 (ICD-10-CM) - Sacrococcygeal disorders, not elsewhere classified. Patient demonstrates muscle weakness, reduced ROM, and fascial restrictions which are likely contributing to symptoms of pain and are negatively impacting patient ability to perform ADLs and functional mobility tasks. Patient will benefit from skilled physical therapy services to address these deficits to reduce pain and improve level of function with ADLs and functional mobility tasks.   OBJECTIVE IMPAIRMENTS: decreased activity tolerance, decreased ROM, decreased strength, increased fascial restrictions, and impaired perceived functional ability.   ACTIVITY  LIMITATIONS: sitting and bed mobility  PARTICIPATION LIMITATIONS: driving  PERSONAL FACTORS: left knee meniscus tear; also awaiting neck fusion and history of back issues are also affecting patient's functional outcome.   REHAB POTENTIAL: Good  CLINICAL DECISION MAKING: Evolving/moderate complexity  EVALUATION COMPLEXITY: Moderate   GOALS: Goals reviewed with patient? No  SHORT TERM GOALS: Target date: 04/05/2024  patient will be independent with initial HEP and compliant with HEP 3-4 times a week   Baseline: Goal status: INITIAL  2.  Patient will report 50% improvement overall  Baseline:  Goal status: INITIAL  LONG TERM GOALS: Target date: 04/26/2024  Patient will be independent in self management strategies to improve quality of life and functional outcomes.  Baseline:  Goal status: INITIAL  2.  Patient will report 70% improvement overall  Baseline:  Goal status: INITIAL  3.  Patient will improve Modified Oswestry score by 6 points to demonstrate improved perceived function  Baseline: 19/50 Goal status: INITIAL  4.  Patient will improve 5 times sit to stand score to 12 sec or less to demonstrate improved functional mobility and increased leg strength.    Baseline: 15.48 sec Goal status: INITIAL  5.  Patient will be able to sit x 1 hour to watch a movie or drive out of town without pain > 2/10  Baseline: 8/10 after sitting 30 min Goal status: INITIAL   PLAN:  PT FREQUENCY: 2x/week  PT DURATION: 6 weeks  PLANNED INTERVENTIONS: [id number]- PT Re-evaluation, 97110-Therapeutic exercises, 97530- Therapeutic activity, 97112- Neuromuscular re-education, 97535- Self Care, [id number]- Manual therapy, [id number]- Gait training, [phone]- Orthotic Fit/training, [phone]- Canalith repositioning, [id number]- Aquatic Therapy, 97760- Splinting, [phone]- Wound care (first 20 sq cm), 97598- Wound care (each additional 20 sq cm)Patient/Family education, Balance training, Stair training, Taping, Dry  Needling, Joint mobilization, Joint manipulation, Spinal manipulation, Spinal mobilization, Scar mobilization, and DME instructions. SABRA  PLAN FOR NEXT SESSION: Review HEP and goals; stretching around sacrum, hips and coccyx; low back strengthening  Lacinda Fass, PT, DPT  4:15 PM, 04/09/24  "

## 2024-04-13 ENCOUNTER — Encounter (HOSPITAL_COMMUNITY): Payer: Self-pay

## 2024-04-13 ENCOUNTER — Ambulatory Visit (HOSPITAL_COMMUNITY)

## 2024-04-13 DIAGNOSIS — M545 Low back pain, unspecified: Secondary | ICD-10-CM

## 2024-04-13 DIAGNOSIS — M533 Sacrococcygeal disorders, not elsewhere classified: Secondary | ICD-10-CM

## 2024-04-13 NOTE — Therapy (Signed)
 " OUTPATIENT PHYSICAL THERAPY THORACOLUMBAR TREATMENT   Patient Name: Andrew Fisher MRN: [id number] DOB:[birth year], 69 y.o., male Today's Date: 04/13/2024  END OF SESSION:  PT End of Session - 04/13/24 1243     Visit Number 6    Number of Visits 12    Date for Recertification  04/26/24    Authorization Type [employer] medic[employer]    Authorization Time Period no auth needed    Progress Note Due on Visit 10    PT Start Time 1247    PT Stop Time 1333    PT Time Calculation (min) 46 min    Activity Tolerance Patient tolerated treatment well    Behavior During Therapy WFL for tasks assessed/performed             Past Medical History:  Diagnosis Date   Arthritis    Asthma    GERD (gastroesophageal reflux disease)    Hearing loss    Hemorrhoids    Hyperlipidemia    Low testosterone  in male    Peripheral neuropathy    Sleep apnea    wears cpap   Sore throat    Wears glasses    Wears hearing aid in both ears    Past Surgical History:  Procedure Laterality Date   ANTERIOR CERVICAL DECOMP/DISCECTOMY FUSION N/A 01/09/2017   Procedure: ANTERIOR CERVICAL DECOMPRESSION FUSION, CERVICAL FOUR-FIVE, CERVICAL FIVE-SIX INSTRUMENTATION AND ALLOGRAFT;  Surgeon: Beuford Anes, MD;  Location: [facility] OR;  Service: Orthopedics;  Laterality: N/A;  ANTERIOR CERVICAL DECOMPRESSION FUSION, CERVICAL FOUR-FIVE, CERVICAL FIVE-SIX INSTRUMENTATION AND ALLOGRAFT   APPENDECTOMY     BIOPSY  11/12/2019   Procedure: BIOPSY;  Surgeon: Rollin Dover, MD;  Location: [facility];  Service: Endoscopy;;   CARPAL TUNNEL RELEASE Left    06/2022   CARPAL TUNNEL RELEASE Right    10/2022   COLONOSCOPY     ESOPHAGOGASTRODUODENOSCOPY (EGD) WITH PROPOFOL  N/A 11/12/2019   Procedure: ESOPHAGOGASTRODUODENOSCOPY (EGD) WITH PROPOFOL ;  Surgeon: Rollin Dover, MD;  Location: [facility];  Service: Endoscopy;  Laterality: N/A;   NECK SURGERY     SAVORY DILATION N/A 11/12/2019   Procedure: SAVORY  DILATION;  Surgeon: Rollin Dover, MD;  Location: [facility];  Service: Endoscopy;  Laterality: N/A;   SHOULDER SURGERY  2005   rotator cuff and tendons repaired   Patient Active Problem List   Diagnosis Date Noted   Coronary artery disease involving native coronary artery of native heart without angina pectoris 01/07/2024   Palpitations 09/23/2023   Thrombocytopenia 07/02/2023   Dietary iron deficiency without anemia 07/02/2023   Gastroesophageal reflux disease without esophagitis 07/02/2023   Atypical chest pain 07/02/2023   Chronic bilateral low back pain without sciatica 07/02/2023   Hypogonadism in male 07/02/2023   Essential hypertension, benign 01/01/2023   Other abnormal glucose 01/01/2023   Class 1 obesity due to excess calories with serious comorbidity and body mass index (BMI) of 30.0 to 30.9 in adult 01/01/2023   Hypokalemia 01/01/2023   Benign ethnic neutropenia 06/13/2022   Sickle cell trait 06/13/2022   Visit for TB skin test 05/04/2018   Pure hypercholesterolemia 03/26/2018   Family history of colon cancer 03/26/2018   Cough 03/26/2018   Allergic rhinitis due to allergen 03/26/2018   Hematoma of neck 01/11/2017   S/P cervical spinal fusion 01/11/2017   Radiculopathy 01/09/2017   Hemorrhoids, internal 12/11/2010    PCP: Jarold Medici, MD  REFERRING PROVIDER: Volanda Saupe, MD  REFERRING DIAG: M53.3 (ICD-10-CM) - Sacrococcygeal disorders, not  elsewhere classified  Rationale for Evaluation and Treatment: Rehabilitation  THERAPY DIAG:  Low back pain, unspecified back pain laterality, unspecified chronicity, unspecified whether sciatica present  Coccyx pain  ONSET DATE: 8 months ago  SUBJECTIVE:                                                                                                                                                                                           SUBJECTIVE STATEMENT: Saw MD last week, plans for stress test tomorrow.   Reports he some soreness, no real pain.  Does have some pain in Lt knee.  Had a rough night last night due to pain, able to sleep for 5 hours  Eval: Late check in; His neighbor fell about 8 months ago and he was trying to help her up and he fell.  [facility] gave [facility] pain medication but he is still having pain the most with sitting and laying down; he does get some relief with sleeping on his side.    PERTINENT HISTORY:  CPAP Cervical fusion 2018 Sickle cell trait Awaiting left knee surgery and cervical fusion  PAIN:  Are you having pain? Yes: NPRS scale: 0/10 Pain location: coccyx, hamstrings, adductors Pain description: sore Aggravating factors: sitting, laying supine Relieving factors: movement, medication  PRECAUTIONS: None  RED FLAGS: None   WEIGHT BEARING RESTRICTIONS: No  FALLS:  Has patient fallen in last 6 months? Yes. Number of falls 1   OCCUPATION: retired veteran  PLOF: Independent  PATIENT GOALS: less pain  NEXT MD VISIT: PRN  OBJECTIVE:  Note: Objective measures were completed at Evaluation unless otherwise noted.  DIAGNOSTIC FINDINGS:    PATIENT SURVEYS:  Oswestry Low Back Pain Disability Questionnaire:  OSWESTRY LBP DISABILITY Questionnaire  Date: 03/15/2024 Score  Total 19/50; 38%   Interpretation of scores: Score Category Description  0-20% Minimal Disability The patient can cope with most living activities. Usually no treatment is indicated apart from advice on lifting, sitting and exercise  21-40% Moderate Disability The patient experiences more pain and difficulty with sitting, lifting and standing. Travel and social life are more difficult and they may be disabled from work. Personal care, sexual activity and sleeping are not grossly affected, and the patient can usually be managed by conservative means  41-60% Severe Disability Pain remains the main problem in this group, but activities of daily living are affected. These patients require a detailed  investigation  61-80% Crippled Back pain impinges on all aspects of the patients life. Positive intervention is required  81-100% Bed-bound These patients are either bed-bound or exaggerating their symptoms  Reference: Adelle SPEAK, [place] PB. The Oswestry  Disability Index. Spine 2000 Nov 15;25(22):2940-52; discussion 21.  Minimum detectable change (90% confidence): 10% points   COGNITION: Overall cognitive status: Within functional limits for tasks assessed     SENSATION: WFL  MUSCLE LENGTH: Hamstrings:   POSTURE: No Significant postural limitations  PALPATION: No tenderness with sacral pressure  LUMBAR ROM:   AROM eval  Flexion Fingertips to mid shins  Extension 30% available *  Right lateral flexion   Left lateral flexion   Right rotation   Left rotation    (Blank rows = not tested)  LOWER EXTREMITY ROM:     Active  Right eval Left eval  Hip flexion    Hip extension    Hip abduction    Hip adduction    Hip internal rotation    Hip external rotation    Knee flexion    Knee extension    Ankle dorsiflexion    Ankle plantarflexion    Ankle inversion    Ankle eversion     (Blank rows = not tested)  LOWER EXTREMITY MMT:    MMT Right eval Left eval  Hip flexion 4+ 4+  Hip extension 4 4  Hip abduction    Hip adduction    Hip internal rotation    Hip external rotation    Knee flexion 4+ 4+  Knee extension 5 4+  Ankle dorsiflexion 5 5  Ankle plantarflexion    Ankle inversion    Ankle eversion     (Blank rows = not tested)  LUMBAR SPECIAL TESTS:  SI Compression/distraction test: Negative  FUNCTIONAL TESTS:  5 times sit to stand: 15.48 sec hands on thight  GAIT: Distance walked: 50 ft in clinic Assistive device utilized: None Level of assistance: Complete Independence Comments: no significant gait deviation noted  TREATMENT DATE:  04/13/24: - Treadmill 2.0 grade @2 .2 speed x 5 minutes - 3D hip excursion 5x (Sidebend with UE overhead,  rotation, squat front of chair, lumbar extension) - GTB shoulder extension 10x - GTB Rows 10x  - Palloff press GTB 20x each side NBOS  Seated: Piriformis stretch 2x 30                                  04/09/24 EXERCISE LOG  Exercise Repetitions and Resistance Comments  Treadmill  2.0 mph @ 2% incline x 5 minutes    Standing lumbar extension   15 reps  Over parallel bars   Supine HS stretch  3 x 30 seconds each    Pallof press  GTB x 20 reps each    Resisted pull down   GTB x 20 reps    Standing ball roll out   2 minutes  Multidirectional   Patient education  HEP, activity modification, healing, and progress with physical therapy     Blank cell = exercise not performed today   04/02/2024 TM 1.2 mph with focus on improved heel strike Standing lumbar extension over the bar x 10 Hamstring stretch 5 x 20 each standing using 8 box Hamstring stretch seated 5 x 20 each long sitting Supine: LTR x 10 each way Bridge x 10  03/30/24 Standing:  hamstring stretch using chair 30 each Seated: hamstring stretch long sitting 30 each Supine:  TA abdominal isometrics 10X3  Bridge 10X each with 3 holds  Lower trunk rotation 10X3  Single knee to chest 5X10  PATIENT EDUCATION:  Education details: Patient educated on HEP, healing, prognosis,  and anatomy Person educated: Patient Education method: Explanation, Demonstration, and Handouts Education comprehension: verbalized understanding, returned demonstration, verbal cues required, and tactile cues required   HOME EXERCISE PROGRAM: Access Code: [email or web address] URL: [email or web address] Date: 04/09/2024 Prepared by: Lacinda Fass  Exercises - Standing Lumbar Spine Flexion Stretch Counter  - 1 x daily - 7 x weekly - 2 sets - 10 reps - Supine Hamstring Stretch with Strap  - 1 x daily - 7 x weekly - 3 sets - 30 seconds hold - Standing Lumbar Extension with Counter  - 1 x daily - 7 x weekly - 3 sets - 10 reps  04/13/24: -  Seated Piriformis Stretch with Trunk Bend  - 2 x daily - 7 x weekly - 1 sets - 3 reps - 30 hold - GTB shoulder extension and rows  Access Code: [email or web address] URL: [email or web address] Date: 03/23/2024 Prepared by: Lacinda Fass Exercises - Supine Lower Trunk Rotation  - 1 x daily - 7 x weekly - 3 sets - 10 reps - Supine Double Knee to Chest  - 1 x daily - 7 x weekly - 5 sets - 20 seconds hold - Supine Hip Adduction Isometric with Ball  - 1 x daily - 7 x weekly - 3 sets - 10 reps - 5 seconds hold  Access Code: [email or web address] URL: [email or web address] Date: 03/15/2024 Prepared by: [facility] - Rehab Exercises - Supine Hamstring Stretch  - 2 x daily - 7 x weekly - 1 sets - 5 reps - 20 sec hold - Supine Figure 4 Piriformis Stretch  - 2 x daily - 7 x weekly - 1 sets - 5 reps - 20 sec hold  Access Code: [email or web address] URL: [email or web address] Date: 03/30/2024 Prepared by: Greig Fuse Exercises - Supine Transversus Abdominis Bracing - Hands on Stomach  - 2 x daily - 7 x weekly - 10 reps - 5 sec hold - Beginner Bridge  - 2 x daily - 7 x weekly - 10 reps - 5 sec hold - Seated Table Hamstring Stretch  - 2 x daily - 7 x weekly - 3 reps - 30 sec hold  ASSESSMENT:  CLINICAL IMPRESSION: Patient was progressed with multiple new interventions for improved lumbar stability. He required minimal cueing with today's new interventions for proper biomechanics. He noticed reduced stiffness following the supine hamstring stretch and standing ball roll out.  He reported feeling good upon the conclusion of treatment. Patient continues to require skilled physical therapy to address their remaining impairments to return to her prior level of function.   Began session on treadmill for dynamic warm up with ability to increased cadence with gait mechanics WNL.  Added hip mobility exercises/stretches in standing and seated position with positive feedback.  Pt educated on importance of posture  for lumbar support and balance training.  Pt able to complete all exercises with good form following initial instructions with no reports of pain, did required some cueing for proper mechanics with squats to encourage more gluteal activation.  Pt given HEP printout included seated piriformis and GTB postural strengthening, verbalized understanding.  Goals were reviewed this session with verbal agreement on POC.   Eval: Patient is a 69 y.o. male who was seen today for physical therapy evaluation and treatment for M53.3 (ICD-10-CM) - Sacrococcygeal disorders, not elsewhere classified. Patient demonstrates muscle weakness, reduced ROM, and fascial restrictions which are likely contributing to symptoms of pain and are negatively impacting patient ability to perform ADLs and functional mobility tasks. Patient will benefit from skilled physical  therapy services to address these deficits to reduce pain and improve level of function with ADLs and functional mobility tasks.   OBJECTIVE IMPAIRMENTS: decreased activity tolerance, decreased ROM, decreased strength, increased fascial restrictions, and impaired perceived functional ability.   ACTIVITY LIMITATIONS: sitting and bed mobility  PARTICIPATION LIMITATIONS: driving  PERSONAL FACTORS: left knee meniscus tear; also awaiting neck fusion and history of back issues are also affecting patient's functional outcome.   REHAB POTENTIAL: Good  CLINICAL DECISION MAKING: Evolving/moderate complexity  EVALUATION COMPLEXITY: Moderate   GOALS: Goals reviewed with patient? No  SHORT TERM GOALS: Target date: 04/05/2024  patient will be independent with initial HEP and compliant with HEP 3-4 times a week   Baseline: Goal status: INITIAL  2.  Patient will report 50% improvement overall  Baseline:  Goal status: INITIAL  LONG TERM GOALS: Target date: 04/26/2024  Patient will be independent in self management strategies to improve quality of life and  functional outcomes.  Baseline:  Goal status: INITIAL  2.  Patient will report 70% improvement overall  Baseline:  Goal status: INITIAL  3.  Patient will improve Modified Oswestry score by 6 points to demonstrate improved perceived function  Baseline: 19/50 Goal status: INITIAL  4.  Patient will improve 5 times sit to stand score to 12 sec or less to demonstrate improved functional mobility and increased leg strength.    Baseline: 15.48 sec Goal status: INITIAL  5.  Patient will be able to sit x 1 hour to watch a movie or drive out of town without pain > 2/10  Baseline: 8/10 after sitting 30 min Goal status: INITIAL   PLAN:  PT FREQUENCY: 2x/week  PT DURATION: 6 weeks  PLANNED INTERVENTIONS: 97164- PT Re-evaluation, 97110-Therapeutic exercises, 97530- Therapeutic activity, 97112- Neuromuscular re-education, 97535- Self Care, [id number]- Manual therapy, [id number]- Gait training, [phone]- Orthotic Fit/training, [phone]- Canalith repositioning, [id number]- Aquatic Therapy, 97760- Splinting, [phone]- Wound care (first 20 sq cm), 97598- Wound care (each additional 20 sq cm)Patient/Family education, Balance training, Stair training, Taping, Dry Needling, Joint mobilization, Joint manipulation, Spinal manipulation, Spinal mobilization, Scar mobilization, and DME instructions. SABRA  PLAN FOR NEXT SESSION: stretching around sacrum, hips and coccyx; low back strengthening.    Augustin Mclean, LPTA/CLT; CBIS [phone]  1:49 PM, 04/13/24  "

## 2024-04-15 ENCOUNTER — Encounter (HOSPITAL_COMMUNITY): Payer: Self-pay

## 2024-04-15 ENCOUNTER — Ambulatory Visit (HOSPITAL_COMMUNITY)

## 2024-04-15 DIAGNOSIS — M533 Sacrococcygeal disorders, not elsewhere classified: Secondary | ICD-10-CM

## 2024-04-15 DIAGNOSIS — M545 Low back pain, unspecified: Secondary | ICD-10-CM | POA: Diagnosis not present

## 2024-04-15 NOTE — Therapy (Addendum)
 " OUTPATIENT PHYSICAL THERAPY THORACOLUMBAR TREATMENT   Patient Name: Andrew Fisher MRN: [id number] DOB:1955/09/26, 69 y.o., male Today's Date: 04/15/2024  END OF SESSION:  PT End of Session - 04/15/24 1245     Visit Number 7    Number of Visits 12    Date for Recertification  04/26/24    Authorization Type [employer]    Authorization Time Period no auth needed    Progress Note Due on Visit 10    PT Start Time 1245    PT Stop Time 1329    PT Time Calculation (min) 44 min    Activity Tolerance Patient tolerated treatment well    Behavior During Therapy WFL for tasks assessed/performed              Past Medical History:  Diagnosis Date   Arthritis    Asthma    GERD (gastroesophageal reflux disease)    Hearing loss    Hemorrhoids    Hyperlipidemia    Low testosterone  in male    Peripheral neuropathy    Sleep apnea    wears cpap   Sore throat    Wears glasses    Wears hearing aid in both ears    Past Surgical History:  Procedure Laterality Date   ANTERIOR CERVICAL DECOMP/DISCECTOMY FUSION N/A 01/09/2017   Procedure: ANTERIOR CERVICAL DECOMPRESSION FUSION, CERVICAL FOUR-FIVE, CERVICAL FIVE-SIX INSTRUMENTATION AND ALLOGRAFT;  Surgeon: Beuford Anes, MD;  Location: [facility] OR;  Service: Orthopedics;  Laterality: N/A;  ANTERIOR CERVICAL DECOMPRESSION FUSION, CERVICAL FOUR-FIVE, CERVICAL FIVE-SIX INSTRUMENTATION AND ALLOGRAFT   APPENDECTOMY     BIOPSY  11/12/2019   Procedure: BIOPSY;  Surgeon: Rollin Dover, MD;  Location: [facility];  Service: Endoscopy;;   CARPAL TUNNEL RELEASE Left    06/2022   CARPAL TUNNEL RELEASE Right    10/2022   COLONOSCOPY     ESOPHAGOGASTRODUODENOSCOPY (EGD) WITH PROPOFOL  N/A 11/12/2019   Procedure: ESOPHAGOGASTRODUODENOSCOPY (EGD) WITH PROPOFOL ;  Surgeon: Rollin Dover, MD;  Location: [facility];  Service: Endoscopy;  Laterality: N/A;   NECK SURGERY     SAVORY DILATION N/A 11/12/2019   Procedure: SAVORY  DILATION;  Surgeon: Rollin Dover, MD;  Location: [facility];  Service: Endoscopy;  Laterality: N/A;   SHOULDER SURGERY  2005   rotator cuff and tendons repaired   Patient Active Problem List   Diagnosis Date Noted   Coronary artery disease involving native coronary artery of native heart without angina pectoris 01/07/2024   Palpitations 09/23/2023   Thrombocytopenia 07/02/2023   Dietary iron deficiency without anemia 07/02/2023   Gastroesophageal reflux disease without esophagitis 07/02/2023   Atypical chest pain 07/02/2023   Chronic bilateral low back pain without sciatica 07/02/2023   Hypogonadism in male 07/02/2023   Essential hypertension, benign 01/01/2023   Other abnormal glucose 01/01/2023   Class 1 obesity due to excess calories with serious comorbidity and body mass index (BMI) of 30.0 to 30.9 in adult 01/01/2023   Hypokalemia 01/01/2023   Benign ethnic neutropenia 06/13/2022   Sickle cell trait 06/13/2022   Visit for TB skin test 05/04/2018   Pure hypercholesterolemia 03/26/2018   Family history of colon cancer 03/26/2018   Cough 03/26/2018   Allergic rhinitis due to allergen 03/26/2018   Hematoma of neck 01/11/2017   S/P cervical spinal fusion 01/11/2017   Radiculopathy 01/09/2017   Hemorrhoids, internal 12/11/2010    PCP: Jarold Medici, MD  REFERRING PROVIDER: Volanda Saupe, MD  REFERRING DIAG: M53.3 (ICD-10-CM) - Sacrococcygeal disorders,  not elsewhere classified  Rationale for Evaluation and Treatment: Rehabilitation  THERAPY DIAG:  Low back pain, unspecified back pain laterality, unspecified chronicity, unspecified whether sciatica present  Coccyx pain  ONSET DATE: 8 months ago  SUBJECTIVE:                                                                                                                                                                                           SUBJECTIVE STATEMENT: Pt had a fall yesterday. [place]  [employer] and [place], then slid and hit his left knee on the wall. He was not injured by the fall, but does report feeling sore today. He also had a stress test yesterday and thinks that might also be contributing to the soreness today.  Eval: Late check in; His neighbor fell about 8 months ago and he was trying to help her up and he fell.  [facility] gave [facility] pain medication but he is still having pain the most with sitting and laying down; he does get some relief with sleeping on his side.    PERTINENT HISTORY:  CPAP Cervical fusion 2018 Sickle cell trait Awaiting left knee surgery and cervical fusion  PAIN:  Are you having pain? Yes: NPRS scale: 0/10 Pain location: coccyx, hamstrings, adductors Pain description: sore Aggravating factors: sitting, laying supine Relieving factors: movement, medication  PRECAUTIONS: None  RED FLAGS: None   WEIGHT BEARING RESTRICTIONS: No  FALLS:  Has patient fallen in last 6 months? Yes. Number of falls 1   OCCUPATION: retired veteran  PLOF: Independent  PATIENT GOALS: less pain  NEXT MD VISIT: PRN  OBJECTIVE:  Note: Objective measures were completed at Evaluation unless otherwise noted.  DIAGNOSTIC FINDINGS:    PATIENT SURVEYS:  Oswestry Low Back Pain Disability Questionnaire:  OSWESTRY LBP DISABILITY Questionnaire  Date: 03/15/2024 Score  Total 19/50; 38%   Interpretation of scores: Score Category Description  0-20% Minimal Disability The patient can cope with most living activities. Usually no treatment is indicated apart from advice on lifting, sitting and exercise  21-40% Moderate Disability The patient experiences more pain and difficulty with sitting, lifting and standing. Travel and social life are more difficult and they may be disabled from work. Personal care, sexual activity and sleeping are not grossly affected, and the patient can usually be managed by conservative means  41-60% Severe Disability Pain remains the  main problem in this group, but activities of daily living are affected. These patients require a detailed investigation  61-80% Crippled Back pain impinges on all aspects of the patients life. Positive intervention is required  81-100% Bed-bound These patients are  either bed-bound or exaggerating their symptoms  Reference: Adelle SPEAK, Pynsent PB. The Oswestry Disability Index. Spine 2000 Nov 15;25(22):2940-52; discussion 50.  Minimum detectable change (90% confidence): 10% points   COGNITION: Overall cognitive status: Within functional limits for tasks assessed     SENSATION: WFL  MUSCLE LENGTH: Hamstrings:   POSTURE: No Significant postural limitations  PALPATION: No tenderness with sacral pressure  LUMBAR ROM:   AROM eval   Flexion Fingertips to mid shins   Extension 30% available *   Right lateral flexion    Left lateral flexion    Right rotation    Left rotation     (Blank rows = not tested)  LOWER EXTREMITY ROM:     Active  Right eval Left eval  Hip flexion    Hip extension    Hip abduction    Hip adduction    Hip internal rotation    Hip external rotation    Knee flexion    Knee extension    Ankle dorsiflexion    Ankle plantarflexion    Ankle inversion    Ankle eversion     (Blank rows = not tested)  LOWER EXTREMITY MMT:    MMT Right eval Left eval Right 04/15/24 Left 04/15/24  Hip flexion 4+ 4+ 4+ 4+  Hip extension 4 4 5 5   Hip abduction      Hip adduction      Hip internal rotation      Hip external rotation      Knee flexion 4+ 4+ 5 5  Knee extension 5 4+ 5 5  Ankle dorsiflexion 5 5    Ankle plantarflexion      Ankle inversion      Ankle eversion       (Blank rows = not tested)  LUMBAR SPECIAL TESTS:  SI Compression/distraction test: Negative  FUNCTIONAL TESTS:  5 times sit to stand: 13.63 sec with no UE support  GAIT: Distance walked: 50 ft in clinic Assistive device utilized: None Level of assistance: Complete  Independence Comments: no significant gait deviation noted  TREATMENT DATE:                                    04/15/24 EXERCISE LOG  Exercise Repetitions and Resistance Comments  Treadmill Speed 2 x Grade 2 X 6 minutes   Goal Review  Met HEP short term goal. Met ODI and   Knees to chest with ball 15 reps x green ball   Seated marches 15 reps per side x 3# ankle weights   Lumbar stretch with ball on table  3 minutes x green ball    Blank cell = exercise not performed today   04/13/24: - Treadmill 2.0 grade @2 .2 speed x 5 minutes - 3D hip excursion 5x (Sidebend with UE overhead, rotation, squat front of chair, lumbar extension) - GTB shoulder extension 10x - GTB Rows 10x  - Palloff press GTB 20x each side NBOS  Seated: Piriformis stretch 2x 30                                   04/09/24 EXERCISE LOG  Exercise Repetitions and Resistance Comments  Treadmill  2.0 mph @ 2% incline x 5 minutes    Standing lumbar extension   15 reps  Over parallel bars   Supine HS stretch  3 x 30 seconds each    Pallof press  GTB x 20 reps each    Resisted pull down   GTB x 20 reps    Standing ball roll out   2 minutes  Multidirectional   Patient education  HEP, activity modification, healing, and progress with physical therapy     Blank cell = exercise not performed today   PATIENT EDUCATION:  Education details: Patient educated on HEP, healing, prognosis, and anatomy Person educated: Patient Education method: Explanation, Demonstration, and Handouts Education comprehension: verbalized understanding, returned demonstration, verbal cues required, and tactile cues required   HOME EXERCISE PROGRAM: Access Code: [email or web address] URL: [email or web address] Date: 04/09/2024 Prepared by: Lacinda Fass  Exercises - Standing Lumbar Spine Flexion Stretch Counter  - 1 x daily - 7 x weekly - 2 sets - 10 reps - Supine Hamstring Stretch with Strap  - 1 x daily - 7 x weekly - 3 sets - 30 seconds  hold - Standing Lumbar Extension with Counter  - 1 x daily - 7 x weekly - 3 sets - 10 reps  04/13/24: - Seated Piriformis Stretch with Trunk Bend  - 2 x daily - 7 x weekly - 1 sets - 3 reps - 30 hold - GTB shoulder extension and rows  Access Code: [email or web address] URL: [email or web address] Date: 03/23/2024 Prepared by: Lacinda Fass Exercises - Supine Lower Trunk Rotation  - 1 x daily - 7 x weekly - 3 sets - 10 reps - Supine Double Knee to Chest  - 1 x daily - 7 x weekly - 5 sets - 20 seconds hold - Supine Hip Adduction Isometric with Ball  - 1 x daily - 7 x weekly - 3 sets - 10 reps - 5 seconds hold  Access Code: [email or web address] URL: [email or web address] Date: 03/15/2024 Prepared by: [facility] - Rehab Exercises - Supine Hamstring Stretch  - 2 x daily - 7 x weekly - 1 sets - 5 reps - 20 sec hold - Supine Figure 4 Piriformis Stretch  - 2 x daily - 7 x weekly - 1 sets - 5 reps - 20 sec hold  Access Code: [email or web address] URL: [email or web address] Date: 03/30/2024 Prepared by: Greig Fuse Exercises - Supine Transversus Abdominis Bracing - Hands on Stomach  - 2 x daily - 7 x weekly - 10 reps - 5 sec hold - Beginner Bridge  - 2 x daily - 7 x weekly - 10 reps - 5 sec hold - Seated Table Hamstring Stretch  - 2 x daily - 7 x weekly - 3 reps - 30 sec hold  ASSESSMENT:  CLINICAL IMPRESSION: Pt met one short term goal and two long term goals. He improved his 5 time STS time to 13.63 seconds, but did not met the goal of 12 seconds meaning he is still demonstrating functional mobility limitations. Due to pain being worse when laying, pt was introduced to a new intervention in supine. He was introduced to marches to target strength deficits in hip flexion and address pain in the thighs. He tolerated all new interventions well and reported feeling good at the end of treatment today. Pt will continue to benefit from current plan of care to increase muscle weakness, reduced range of  motion, and decrease pain to improve functional limitations.  Eval: Patient is a 69 y.o. male who was seen today for physical therapy evaluation and treatment for M53.3 (ICD-10-CM) - Sacrococcygeal disorders, not elsewhere classified. Patient demonstrates muscle weakness, reduced ROM, and fascial restrictions which are likely contributing  to symptoms of pain and are negatively impacting patient ability to perform ADLs and functional mobility tasks. Patient will benefit from skilled physical therapy services to address these deficits to reduce pain and improve level of function with ADLs and functional mobility tasks.   OBJECTIVE IMPAIRMENTS: decreased activity tolerance, decreased ROM, decreased strength, increased fascial restrictions, and impaired perceived functional ability.   ACTIVITY LIMITATIONS: sitting and bed mobility  PARTICIPATION LIMITATIONS: driving  PERSONAL FACTORS: left knee meniscus tear; also awaiting neck fusion and history of back issues are also affecting patient's functional outcome.   REHAB POTENTIAL: Good  CLINICAL DECISION MAKING: Evolving/moderate complexity  EVALUATION COMPLEXITY: Moderate   GOALS: Goals reviewed with patient? Yes  SHORT TERM GOALS: Target date: 04/05/2024  patient will be independent with initial HEP and compliant with HEP 3-4 times a week   Baseline: Goal status: MET  2.  Patient will report 50% improvement overall  Baseline:  40% (04/15/2024) Goal status: IN PROGRESS  LONG TERM GOALS: Target date: 04/26/2024  Patient will be independent in self management strategies to improve quality of life and functional outcomes.  Baseline:  Goal status: MET  2.  Patient will report 70% improvement overall  Baseline: 40% (04/15/24) Goal status: IN PROGRESS  3.  Patient will improve Modified Oswestry score by 6 points to demonstrate improved perceived function  Baseline: 11/50 (04/15/24) Goal status: MET  4.  Patient will improve 5  times sit to stand score to 12 sec or less to demonstrate improved functional mobility and increased leg strength.    Baseline: 13.63 sec (04/15/24) Goal status: IN PROGRESS  5.  Patient will be able to sit x 1 hour to watch a movie or drive out of town without pain > 2/10  Baseline: 5/10 after sitting 30 min Goal status: IN PROGRESS   PLAN:  PT FREQUENCY: 2x/week  PT DURATION: 6 weeks  PLANNED INTERVENTIONS: [id number]- PT Re-evaluation, 97110-Therapeutic exercises, 97530- Therapeutic activity, 97112- Neuromuscular re-education, 97535- Self Care, [id number]- Manual therapy, [id number]- Gait training, [phone]- Orthotic Fit/training, [phone]- Canalith repositioning, [id number]- Aquatic Therapy, 97760- Splinting, [phone]- Wound care (first 20 sq cm), 97598- Wound care (each additional 20 sq cm)Patient/Family education, Balance training, Stair training, Taping, Dry Needling, Joint mobilization, Joint manipulation, Spinal manipulation, Spinal mobilization, Scar mobilization, and DME instructions. SABRA  PLAN FOR NEXT SESSION: Continue to progress low back strengthening and mobility intervention.  Introduce LE strength exercises to target current deficit in hip flexion.   Venus Galeazzi, SPT   5:25 PM, 04/15/24  Today's visit was completed with direct 1:1 supervision and direction by Lacinda Fass, PT, DPT   "

## 2024-04-16 ENCOUNTER — Other Ambulatory Visit: Payer: Self-pay | Admitting: Orthopedic Surgery

## 2024-04-20 ENCOUNTER — Ambulatory Visit (HOSPITAL_COMMUNITY)

## 2024-04-22 ENCOUNTER — Ambulatory Visit (HOSPITAL_COMMUNITY)

## 2024-04-22 DIAGNOSIS — M545 Low back pain, unspecified: Secondary | ICD-10-CM

## 2024-04-22 DIAGNOSIS — M533 Sacrococcygeal disorders, not elsewhere classified: Secondary | ICD-10-CM

## 2024-04-22 NOTE — Therapy (Signed)
 " OUTPATIENT PHYSICAL THERAPY THORACOLUMBAR TREATMENT   Patient Name: Andrew Fisher MRN: [id number] DOB:12-10-1955, 69 y.o., male Today's Date: 04/22/2024  END OF SESSION:  PT End of Session - 04/22/24 1250     Visit Number 8    Number of Visits 12    Date for Recertification  04/26/24    Authorization Type [employer] medicare    Authorization Time Period no auth needed    Progress Note Due on Visit 10    PT Start Time 1249    PT Stop Time 1330    PT Time Calculation (min) 41 min    Activity Tolerance Patient tolerated treatment well    Behavior During Therapy WFL for tasks assessed/performed              Past Medical History:  Diagnosis Date   Arthritis    Asthma    GERD (gastroesophageal reflux disease)    Hearing loss    Hemorrhoids    Hyperlipidemia    Low testosterone  in male    Peripheral neuropathy    Sleep apnea    wears cpap   Sore throat    Wears glasses    Wears hearing aid in both ears    Past Surgical History:  Procedure Laterality Date   ANTERIOR CERVICAL DECOMP/DISCECTOMY FUSION N/A 01/09/2017   Procedure: ANTERIOR CERVICAL DECOMPRESSION FUSION, CERVICAL FOUR-FIVE, CERVICAL FIVE-SIX INSTRUMENTATION AND ALLOGRAFT;  Surgeon: Beuford Anes, MD;  Location: [facility] OR;  Service: Orthopedics;  Laterality: N/A;  ANTERIOR CERVICAL DECOMPRESSION FUSION, CERVICAL FOUR-FIVE, CERVICAL FIVE-SIX INSTRUMENTATION AND ALLOGRAFT   APPENDECTOMY     BIOPSY  11/12/2019   Procedure: BIOPSY;  Surgeon: Rollin Dover, MD;  Location: [facility];  Service: Endoscopy;;   CARPAL TUNNEL RELEASE Left    06/2022   CARPAL TUNNEL RELEASE Right    10/2022   COLONOSCOPY     ESOPHAGOGASTRODUODENOSCOPY (EGD) WITH PROPOFOL  N/A 11/12/2019   Procedure: ESOPHAGOGASTRODUODENOSCOPY (EGD) WITH PROPOFOL ;  Surgeon: Rollin Dover, MD;  Location: [facility];  Service: Endoscopy;  Laterality: N/A;   NECK SURGERY     SAVORY DILATION N/A 11/12/2019   Procedure: SAVORY  DILATION;  Surgeon: Rollin Dover, MD;  Location: [facility];  Service: Endoscopy;  Laterality: N/A;   SHOULDER SURGERY  2005   rotator cuff and tendons repaired   Patient Active Problem List   Diagnosis Date Noted   Coronary artery disease involving native coronary artery of native heart without angina pectoris 01/07/2024   Palpitations 09/23/2023   Thrombocytopenia 07/02/2023   Dietary iron deficiency without anemia 07/02/2023   Gastroesophageal reflux disease without esophagitis 07/02/2023   Atypical chest pain 07/02/2023   Chronic bilateral low back pain without sciatica 07/02/2023   Hypogonadism in male 07/02/2023   Essential hypertension, benign 01/01/2023   Other abnormal glucose 01/01/2023   Class 1 obesity due to excess calories with serious comorbidity and body mass index (BMI) of 30.0 to 30.9 in adult 01/01/2023   Hypokalemia 01/01/2023   Benign ethnic neutropenia 06/13/2022   Sickle cell trait 06/13/2022   Visit for TB skin test 05/04/2018   Pure hypercholesterolemia 03/26/2018   Family history of colon cancer 03/26/2018   Cough 03/26/2018   Allergic rhinitis due to allergen 03/26/2018   Hematoma of neck 01/11/2017   S/P cervical spinal fusion 01/11/2017   Radiculopathy 01/09/2017   Hemorrhoids, internal 12/11/2010    PCP: Jarold Medici, MD  REFERRING PROVIDER: Volanda Saupe, MD  REFERRING DIAG: M53.3 (ICD-10-CM) - Sacrococcygeal disorders,  not elsewhere classified  Rationale for Evaluation and Treatment: Rehabilitation  THERAPY DIAG:  Low back pain, unspecified back pain laterality, unspecified chronicity, unspecified whether sciatica present  Coccyx pain  ONSET DATE: 8 months ago  SUBJECTIVE:                                                                                                                                                                                           SUBJECTIVE STATEMENT: Think I messed up; reports he shoveled ice and  snow yesterday and day before and is really sore and hurting today.9/10 pain.  Scheduled for cervical fusion 2/12    Eval: Late check in; His neighbor fell about 8 months ago and he was trying to help her up and he fell.  [facility] gave [facility] pain medication but he is still having pain the most with sitting and laying down; he does get some relief with sleeping on his side.    PERTINENT HISTORY:  CPAP Cervical fusion 2018 Sickle cell trait Awaiting left knee surgery and cervical fusion  PAIN:  Are you having pain? Yes: NPRS scale: 0/10 Pain location: coccyx, hamstrings, adductors Pain description: sore Aggravating factors: sitting, laying supine Relieving factors: movement, medication  PRECAUTIONS: None  RED FLAGS: None   WEIGHT BEARING RESTRICTIONS: No  FALLS:  Has patient fallen in last 6 months? Yes. Number of falls 1   OCCUPATION: retired veteran  PLOF: Independent  PATIENT GOALS: less pain  NEXT MD VISIT: PRN  OBJECTIVE:  Note: Objective measures were completed at Evaluation unless otherwise noted.  DIAGNOSTIC FINDINGS:    PATIENT SURVEYS:  Oswestry Low Back Pain Disability Questionnaire:  OSWESTRY LBP DISABILITY Questionnaire  Date: 03/15/2024 Score  Total 19/50; 38%   Interpretation of scores: Score Category Description  0-20% Minimal Disability The patient can cope with most living activities. Usually no treatment is indicated apart from advice on lifting, sitting and exercise  21-40% Moderate Disability The patient experiences more pain and difficulty with sitting, lifting and standing. Travel and social life are more difficult and they may be disabled from work. Personal care, sexual activity and sleeping are not grossly affected, and the patient can usually be managed by conservative means  41-60% Severe Disability Pain remains the main problem in this group, but activities of daily living are affected. These patients require a detailed investigation  61-80%  Crippled Back pain impinges on all aspects of the patients life. Positive intervention is required  81-100% Bed-bound These patients are either bed-bound or exaggerating their symptoms  Reference: Adelle SPEAK, [place] PB. The Oswestry Disability Index. Spine 2000 Nov 15;25(22):2940-52; discussion 11.  Minimum  detectable change (90% confidence): 10% points   COGNITION: Overall cognitive status: Within functional limits for tasks assessed     SENSATION: WFL  MUSCLE LENGTH: Hamstrings:   POSTURE: No Significant postural limitations  PALPATION: No tenderness with sacral pressure  LUMBAR ROM:   AROM eval   Flexion Fingertips to mid shins   Extension 30% available *   Right lateral flexion    Left lateral flexion    Right rotation    Left rotation     (Blank rows = not tested)  LOWER EXTREMITY ROM:     Active  Right eval Left eval  Hip flexion    Hip extension    Hip abduction    Hip adduction    Hip internal rotation    Hip external rotation    Knee flexion    Knee extension    Ankle dorsiflexion    Ankle plantarflexion    Ankle inversion    Ankle eversion     (Blank rows = not tested)  LOWER EXTREMITY MMT:    MMT Right eval Left eval Right 04/15/24 Left 04/15/24  Hip flexion 4+ 4+ 4+ 4+  Hip extension 4 4 5 5   Hip abduction      Hip adduction      Hip internal rotation      Hip external rotation      Knee flexion 4+ 4+ 5 5  Knee extension 5 4+ 5 5  Ankle dorsiflexion 5 5    Ankle plantarflexion      Ankle inversion      Ankle eversion       (Blank rows = not tested)  LUMBAR SPECIAL TESTS:  SI Compression/distraction test: Negative  FUNCTIONAL TESTS:  5 times sit to stand: 13.63 sec with no UE support  GAIT: Distance walked: 50 ft in clinic Assistive device utilized: None Level of assistance: Complete Independence Comments: no significant gait deviation noted  TREATMENT DATE:  04/22/24 Sitting: moist heat to low back x 5' to decrease pain  and improve soft tissue extensibility Abdominal bracing pushing down through bolster 5 hold 2 x 10 Hip adduction with ball 5; 2  x 10 Hip abduction with green theraband 2 x 10 Red physioball roll outs for lumbar flexion 2 x 10                                      04/15/24 EXERCISE LOG  Exercise Repetitions and Resistance Comments  Treadmill Speed 2 x Grade 2 X 6 minutes   Goal Review  Met HEP short term goal. Met ODI and   Knees to chest with ball 15 reps x green ball   Seated marches 15 reps per side x 3# ankle weights   Lumbar stretch with ball on table  3 minutes x green ball    Blank cell = exercise not performed today   04/13/24: - Treadmill 2.0 grade @2 .2 speed x 5 minutes - 3D hip excursion 5x (Sidebend with UE overhead, rotation, squat front of chair, lumbar extension) - GTB shoulder extension 10x - GTB Rows 10x  - Palloff press GTB 20x each side NBOS  Seated: Piriformis stretch 2x 30                                   04/09/24 EXERCISE LOG  Exercise Repetitions  and Resistance Comments  Treadmill  2.0 mph @ 2% incline x 5 minutes    Standing lumbar extension   15 reps  Over parallel bars   Supine HS stretch  3 x 30 seconds each    Pallof press  GTB x 20 reps each    Resisted pull down   GTB x 20 reps    Standing ball roll out   2 minutes  Multidirectional   Patient education  HEP, activity modification, healing, and progress with physical therapy     Blank cell = exercise not performed today   PATIENT EDUCATION:  Education details: Patient educated on HEP, healing, prognosis, and anatomy Person educated: Patient Education method: Explanation, Demonstration, and Handouts Education comprehension: verbalized understanding, returned demonstration, verbal cues required, and tactile cues required   HOME EXERCISE PROGRAM: Access Code: [email or web address] URL: [email or web address] Date: 04/09/2024 Prepared by: Lacinda Fass  Exercises - Standing Lumbar Spine  Flexion Stretch Counter  - 1 x daily - 7 x weekly - 2 sets - 10 reps - Supine Hamstring Stretch with Strap  - 1 x daily - 7 x weekly - 3 sets - 30 seconds hold - Standing Lumbar Extension with Counter  - 1 x daily - 7 x weekly - 3 sets - 10 reps  04/13/24: - Seated Piriformis Stretch with Trunk Bend  - 2 x daily - 7 x weekly - 1 sets - 3 reps - 30 hold - GTB shoulder extension and rows  Access Code: [email or web address] URL: [email or web address] Date: 03/23/2024 Prepared by: Lacinda Fass Exercises - Supine Lower Trunk Rotation  - 1 x daily - 7 x weekly - 3 sets - 10 reps - Supine Double Knee to Chest  - 1 x daily - 7 x weekly - 5 sets - 20 seconds hold - Supine Hip Adduction Isometric with Ball  - 1 x daily - 7 x weekly - 3 sets - 10 reps - 5 seconds hold  Access Code: [email or web address] URL: [email or web address] Date: 03/15/2024 Prepared by: [facility] - Rehab Exercises - Supine Hamstring Stretch  - 2 x daily - 7 x weekly - 1 sets - 5 reps - 20 sec hold - Supine Figure 4 Piriformis Stretch  - 2 x daily - 7 x weekly - 1 sets - 5 reps - 20 sec hold  Access Code: [email or web address] URL: [email or web address] Date: 03/30/2024 Prepared by: Greig Fuse Exercises - Supine Transversus Abdominis Bracing - Hands on Stomach  - 2 x daily - 7 x weekly - 10 reps - 5 sec hold - Beginner Bridge  - 2 x daily - 7 x weekly - 10 reps - 5 sec hold - Seated Table Hamstring Stretch  - 2 x daily - 7 x weekly - 3 reps - 30 sec hold  ASSESSMENT:  CLINICAL IMPRESSION:  Patient with increased pain levels today 9/10; reduced intensity of exercise today due to increased pain and symptoms after fall and re-injury. He is unable to tolerate lying supine today so focus on activating core with isometric exercise in sitting.  No increased pain with treatment today.    Pt will continue to benefit from current plan of care to increase muscle weakness, reduced range of motion, and decrease pain to improve  functional limitations.  Eval: Patient is a 69 y.o. male who was seen today for physical therapy evaluation and treatment for M53.3 (ICD-10-CM) - Sacrococcygeal disorders, not elsewhere classified. Patient demonstrates muscle weakness, reduced ROM, and fascial restrictions which are likely contributing to symptoms of  pain and are negatively impacting patient ability to perform ADLs and functional mobility tasks. Patient will benefit from skilled physical therapy services to address these deficits to reduce pain and improve level of function with ADLs and functional mobility tasks.   OBJECTIVE IMPAIRMENTS: decreased activity tolerance, decreased ROM, decreased strength, increased fascial restrictions, and impaired perceived functional ability.   ACTIVITY LIMITATIONS: sitting and bed mobility  PARTICIPATION LIMITATIONS: driving  PERSONAL FACTORS: left knee meniscus tear; also awaiting neck fusion and history of back issues are also affecting patient's functional outcome.   REHAB POTENTIAL: Good  CLINICAL DECISION MAKING: Evolving/moderate complexity  EVALUATION COMPLEXITY: Moderate   GOALS: Goals reviewed with patient? Yes  SHORT TERM GOALS: Target date: 04/05/2024  patient will be independent with initial HEP and compliant with HEP 3-4 times a week   Baseline: Goal status: MET  2.  Patient will report 50% improvement overall  Baseline:  40% (04/15/2024) Goal status: IN PROGRESS  LONG TERM GOALS: Target date: 04/26/2024  Patient will be independent in self management strategies to improve quality of life and functional outcomes.  Baseline:  Goal status: MET  2.  Patient will report 70% improvement overall  Baseline: 40% (04/15/24) Goal status: IN PROGRESS  3.  Patient will improve Modified Oswestry score by 6 points to demonstrate improved perceived function  Baseline: 11/50 (04/15/24) Goal status: MET  4.  Patient will improve 5 times sit to stand score to 12 sec or  less to demonstrate improved functional mobility and increased leg strength.    Baseline: 13.63 sec (04/15/24) Goal status: IN PROGRESS  5.  Patient will be able to sit x 1 hour to watch a movie or drive out of town without pain > 2/10  Baseline: 5/10 after sitting 30 min Goal status: IN PROGRESS   PLAN:  PT FREQUENCY: 2x/week  PT DURATION: 6 weeks  PLANNED INTERVENTIONS: 97164- PT Re-evaluation, 97110-Therapeutic exercises, 97530- Therapeutic activity, 97112- Neuromuscular re-education, 97535- Self Care, [id number]- Manual therapy, [id number]- Gait training, [phone]- Orthotic Fit/training, [phone]- Canalith repositioning, [id number]- Aquatic Therapy, 97760- Splinting, [phone]- Wound care (first 20 sq cm), 97598- Wound care (each additional 20 sq cm)Patient/Family education, Balance training, Stair training, Taping, Dry Needling, Joint mobilization, Joint manipulation, Spinal manipulation, Spinal mobilization, Scar mobilization, and DME instructions. SABRA  PLAN FOR NEXT SESSION: Continue to progress low back strengthening and mobility intervention.  Introduce LE strength exercises to target current deficit in hip flexion. Reassess next visit  1:09 PM, 04/22/24 Ariyon Gerstenberger Small Nealy Karapetian MPT  physical therapy [place] [phone] [phone]  "

## 2024-04-27 ENCOUNTER — Ambulatory Visit (HOSPITAL_COMMUNITY)

## 2024-04-27 DIAGNOSIS — M545 Low back pain, unspecified: Secondary | ICD-10-CM

## 2024-04-27 DIAGNOSIS — M533 Sacrococcygeal disorders, not elsewhere classified: Secondary | ICD-10-CM

## 2024-04-29 ENCOUNTER — Ambulatory Visit (HOSPITAL_COMMUNITY)

## 2024-04-29 DIAGNOSIS — M533 Sacrococcygeal disorders, not elsewhere classified: Secondary | ICD-10-CM

## 2024-04-29 DIAGNOSIS — M545 Low back pain, unspecified: Secondary | ICD-10-CM

## 2024-04-29 NOTE — Therapy (Addendum)
 " OUTPATIENT PHYSICAL THERAPY THORACOLUMBAR TREATMENT  Patient Name: Andrew Fisher MRN: [id number] DOB:June 20, 1955, 69 y.o., male Today's Date: 04/29/2024  END OF SESSION:  PT End of Session - 04/29/24 1343     Visit Number 10    Number of Visits 12    Date for Recertification  05/07/24    Authorization Type traditional [employer] medicare    Authorization Time Period no auth needed    Progress Note Due on Visit 10    PT Start Time 1332    PT Stop Time 1417    PT Time Calculation (min) 45 min    Activity Tolerance Patient tolerated treatment well    Behavior During Therapy WFL for tasks assessed/performed           Past Medical History:  Diagnosis Date   Arthritis    Asthma    GERD (gastroesophageal reflux disease)    Hearing loss    Hemorrhoids    Hyperlipidemia    Low testosterone  in male    Peripheral neuropathy    Sleep apnea    wears cpap   Sore throat    Wears glasses    Wears hearing aid in both ears    Past Surgical History:  Procedure Laterality Date   ANTERIOR CERVICAL DECOMP/DISCECTOMY FUSION N/A 01/09/2017   Procedure: ANTERIOR CERVICAL DECOMPRESSION FUSION, CERVICAL FOUR-FIVE, CERVICAL FIVE-SIX INSTRUMENTATION AND ALLOGRAFT;  Surgeon: Beuford Anes, MD;  Location: [facility];  Service: Orthopedics;  Laterality: N/A;  ANTERIOR CERVICAL DECOMPRESSION FUSION, CERVICAL FOUR-FIVE, CERVICAL FIVE-SIX INSTRUMENTATION AND ALLOGRAFT   APPENDECTOMY     BIOPSY  11/12/2019   Procedure: BIOPSY;  Surgeon: Rollin Dover, MD;  Location: [facility];  Service: Endoscopy;;   CARPAL TUNNEL RELEASE Left    06/2022   CARPAL TUNNEL RELEASE Right    10/2022   COLONOSCOPY     ESOPHAGOGASTRODUODENOSCOPY (EGD) WITH PROPOFOL  N/A 11/12/2019   Procedure: ESOPHAGOGASTRODUODENOSCOPY (EGD) WITH PROPOFOL ;  Surgeon: Rollin Dover, MD;  Location: [facility];  Service: Endoscopy;  Laterality: N/A;   NECK SURGERY     SAVORY DILATION N/A 11/12/2019   Procedure: SAVORY DILATION;   Surgeon: Rollin Dover, MD;  Location: [facility];  Service: Endoscopy;  Laterality: N/A;   SHOULDER SURGERY  2005   rotator cuff and tendons repaired   Patient Active Problem List   Diagnosis Date Noted   Coronary artery disease involving native coronary artery of native heart without angina pectoris 01/07/2024   Palpitations 09/23/2023   Thrombocytopenia 07/02/2023   Dietary iron deficiency without anemia 07/02/2023   Gastroesophageal reflux disease without esophagitis 07/02/2023   Atypical chest pain 07/02/2023   Chronic bilateral low back pain without sciatica 07/02/2023   Hypogonadism in male 07/02/2023   Essential hypertension, benign 01/01/2023   Other abnormal glucose 01/01/2023   Class 1 obesity due to excess calories with serious comorbidity and body mass index (BMI) of 30.0 to 30.9 in adult 01/01/2023   Hypokalemia 01/01/2023   Benign ethnic neutropenia 06/13/2022   Sickle cell trait 06/13/2022   Visit for TB skin test 05/04/2018   Pure hypercholesterolemia 03/26/2018   Family history of colon cancer 03/26/2018   Cough 03/26/2018   Allergic rhinitis due to allergen 03/26/2018   Hematoma of neck 01/11/2017   S/P cervical spinal fusion 01/11/2017   Radiculopathy 01/09/2017   Hemorrhoids, internal 12/11/2010    PCP: Jarold Medici, MD  REFERRING PROVIDER: Volanda Saupe, MD  REFERRING DIAG: M53.3 (ICD-10-CM) - Sacrococcygeal disorders, not elsewhere classified  Rationale for Evaluation and Treatment: Rehabilitation  THERAPY DIAG:  Low back pain, unspecified back pain laterality, unspecified chronicity, unspecified whether sciatica present  Coccyx pain  ONSET DATE: 8 months ago  SUBJECTIVE:                                                                                                                                                                                           SUBJECTIVE STATEMENT: Pt reports mild pain in low back. He dis not have any  soreness after the last session.   Eval: Late check in; His neighbor fell about 8 months ago and he was trying to help her up and he fell.  [facility] gave [facility] pain medication but he is still having pain the most with sitting and laying down; he does get some relief with sleeping on his side.    PERTINENT HISTORY:  CPAP Cervical fusion 2018 Sickle cell trait Awaiting left knee surgery and cervical fusion  PAIN:  Are you having pain? Yes: NPRS scale: 0/10 Pain location: coccyx, hamstrings, adductors Pain description: sore Aggravating factors: sitting, laying supine Relieving factors: movement, medication  PRECAUTIONS: None  RED FLAGS: None   WEIGHT BEARING RESTRICTIONS: No  FALLS:  Has patient fallen in last 6 months? Yes. Number of falls 1   OCCUPATION: retired veteran  PLOF: Independent  PATIENT GOALS: less pain  NEXT MD VISIT: PRN  OBJECTIVE:  Note: Objective measures were completed at Evaluation unless otherwise noted.  DIAGNOSTIC FINDINGS:    PATIENT SURVEYS:  Oswestry Low Back Pain Disability Questionnaire:  OSWESTRY LBP DISABILITY Questionnaire  Date: 03/15/2024 Score  Total 19/50; 38%   Interpretation of scores: Score Category Description  0-20% Minimal Disability The patient can cope with most living activities. Usually no treatment is indicated apart from advice on lifting, sitting and exercise  21-40% Moderate Disability The patient experiences more pain and difficulty with sitting, lifting and standing. Travel and social life are more difficult and they may be disabled from work. Personal care, sexual activity and sleeping are not grossly affected, and the patient can usually be managed by conservative means  41-60% Severe Disability Pain remains the main problem in this group, but activities of daily living are affected. These patients require a detailed investigation  61-80% Crippled Back pain impinges on all aspects of the patients life. Positive  intervention is required  81-100% Bed-bound These patients are either bed-bound or exaggerating their symptoms  Reference: Adelle SPEAK, [place] PB. The Oswestry Disability Index. Spine 2000 Nov 15;25(22):2940-52; discussion 33.  Minimum detectable change (90% confidence): 10% points   COGNITION: Overall cognitive status: Within functional limits for  tasks assessed     SENSATION: WFL  MUSCLE LENGTH: Hamstrings:   POSTURE: No Significant postural limitations  PALPATION: No tenderness with sacral pressure  LUMBAR ROM:   AROM eval 04/27/2024   Flexion Fingertips to mid shins Fingertips to top of ankles   Extension 30% available * 30% available   Right lateral flexion     Left lateral flexion     Right rotation     Left rotation      (Blank rows = not tested)  LOWER EXTREMITY ROM:     Active  Right eval Left eval  Hip flexion    Hip extension    Hip abduction    Hip adduction    Hip internal rotation    Hip external rotation    Knee flexion    Knee extension    Ankle dorsiflexion    Ankle plantarflexion    Ankle inversion    Ankle eversion     (Blank rows = not tested)  LOWER EXTREMITY MMT:    MMT Right eval Left eval Right 04/15/24 Left 04/15/24 Right 04/27/2024 Left 04/27/24  Hip flexion 4+ 4+ 4+ 4+ 5 5  Hip extension 4 4 5 5     Hip abduction        Hip adduction        Hip internal rotation        Hip external rotation        Knee flexion 4+ 4+ 5 5    Knee extension 5 4+ 5 5 5 5   Ankle dorsiflexion 5 5   5 5   Ankle plantarflexion        Ankle inversion        Ankle eversion         (Blank rows = not tested)  LUMBAR SPECIAL TESTS:  SI Compression/distraction test: Negative  FUNCTIONAL TESTS:  5 times sit to stand: 13.63 sec with no UE support  GAIT: Distance walked: 50 ft in clinic Assistive device utilized: None Level of assistance: Complete Independence Comments: no significant gait deviation noted  TREATMENT DATE:                      04/29/24 EXERCISE LOG  Exercise Repetitions and Resistance Comments  Treadmill Speed 2.6 x 5 minutes   Lumbar stretch over bar 20 reps   Vector stance 5 reps each side x 3 sec hold Tactile and verbal cueing for postural activation of the core to remain upright.  Standing marches 15 reps each side  Cueing for core activation.  Rows 15 reps x GTB Tactile cueing to lower shoulders and contract rhomboids.  Shoulder extensions 15 reps GTB Verbal cueing for core activation.  Banded shoulder flexion 20 GTB Verbal cueing for core activation.   Blank cell = exercise not performed today   04/27/24 Sitting: moist heat to low back x 5' to decrease pain and improve soft tissue extensibility Sitting hip adduction and abdominal bracing 5 hold x 10 Red physioball roll out x 2' for lumbar flexion 5 times sit to stand 16.36 sec Modified Oswestry 23/50; 46% MMT's and AROM see above  04/22/24 Sitting: moist heat to low back x 5' to decrease pain and improve soft tissue extensibility Abdominal bracing pushing down through bolster 5 hold 2 x 10 Hip adduction with ball 5; 2  x 10 Hip abduction with green theraband 2 x 10 Red physioball roll outs for lumbar flexion 2 x 10  PATIENT EDUCATION:  Education  details: Patient educated on HEP, healing, prognosis, and anatomy Person educated: Patient Education method: Explanation, Demonstration, and Handouts Education comprehension: verbalized understanding, returned demonstration, verbal cues required, and tactile cues required   HOME EXERCISE PROGRAM: Access Code: [email or web address] URL: [email or web address] Date: 04/09/2024 Prepared by: Lacinda Fass  Exercises - Standing Lumbar Spine Flexion Stretch Counter  - 1 x daily - 7 x weekly - 2 sets - 10 reps - Supine Hamstring Stretch with Strap  - 1 x daily - 7 x weekly - 3 sets - 30 seconds hold - Standing Lumbar Extension with Counter  - 1 x daily - 7 x weekly - 3 sets - 10 reps  04/13/24: - Seated  Piriformis Stretch with Trunk Bend  - 2 x daily - 7 x weekly - 1 sets - 3 reps - 30 hold - GTB shoulder extension and rows  Access Code: [email or web address] URL: [email or web address] Date: 03/23/2024 Prepared by: Lacinda Fass Exercises - Supine Lower Trunk Rotation  - 1 x daily - 7 x weekly - 3 sets - 10 reps - Supine Double Knee to Chest  - 1 x daily - 7 x weekly - 5 sets - 20 seconds hold - Supine Hip Adduction Isometric with Ball  - 1 x daily - 7 x weekly - 3 sets - 10 reps - 5 seconds hold  Access Code: [email or web address] URL: [email or web address] Date: 03/15/2024 Prepared by: AP - Rehab Exercises - Supine Hamstring Stretch  - 2 x daily - 7 x weekly - 1 sets - 5 reps - 20 sec hold - Supine Figure 4 Piriformis Stretch  - 2 x daily - 7 x weekly - 1 sets - 5 reps - 20 sec hold  Access Code: [email or web address] URL: [email or web address] Date: 03/30/2024 Prepared by: Greig Fuse Exercises - Supine Transversus Abdominis Bracing - Hands on Stomach  - 2 x daily - 7 x weekly - 10 reps - 5 sec hold - Beginner Bridge  - 2 x daily - 7 x weekly - 10 reps - 5 sec hold - Seated Table Hamstring Stretch  - 2 x daily - 7 x weekly - 3 reps - 30 sec hold  ASSESSMENT:  CLINICAL IMPRESSION: Pt tolerated progressed postural interventions well today. He required moderate verbal and tactile cueing for core activation during postural interventions. He was able to perform interventions with correct form following cueing. Pt reported feeling looser at the treatment today. Pt will continue to benefit from skilled physical therapy to increase lumbar ROM, postural control, and reduce pain to decrease functional limitations.   Eval: Patient is a 69 y.o. male who was seen today for physical therapy evaluation and treatment for M53.3 (ICD-10-CM) - Sacrococcygeal disorders, not elsewhere classified. Patient demonstrates muscle weakness, reduced ROM, and fascial restrictions which are likely  contributing to symptoms of pain and are negatively impacting patient ability to perform ADLs and functional mobility tasks. Patient will benefit from skilled physical therapy services to address these deficits to reduce pain and improve level of function with ADLs and functional mobility tasks.   OBJECTIVE IMPAIRMENTS: decreased activity tolerance, decreased ROM, decreased strength, increased fascial restrictions, and impaired perceived functional ability.   ACTIVITY LIMITATIONS: sitting and bed mobility  PARTICIPATION LIMITATIONS: driving  PERSONAL FACTORS: left knee meniscus tear; also awaiting neck fusion and history of back issues are also affecting patient's functional outcome.   REHAB POTENTIAL: Good  CLINICAL DECISION MAKING: Evolving/moderate complexity  EVALUATION COMPLEXITY: Moderate   GOALS: Goals reviewed with patient? Yes  SHORT TERM GOALS:  Target date: 04/05/2024  patient will be independent with initial HEP and compliant with HEP 3-4 times a week   Baseline: Goal status: MET  2.  Patient will report 50% improvement overall  Baseline:  40% (04/15/2024) Goal status: IN PROGRESS  LONG TERM GOALS: Target date: 04/26/2024  Patient will be independent in self management strategies to improve quality of life and functional outcomes.  Baseline:  Goal status: MET  2.  Patient will report 70% improvement overall  Baseline: 40% (04/15/24) Goal status: IN PROGRESS  3.  Patient will improve Modified Oswestry score by 6 points to demonstrate improved perceived function  Baseline: 11/50 (04/15/24) Goal status: MET  4.  Patient will improve 5 times sit to stand score to 12 sec or less to demonstrate improved functional mobility and increased leg strength.    Baseline: 13.63 sec (04/15/24) Goal status: IN PROGRESS  5.  Patient will be able to sit x 1 hour to watch a movie or drive out of town without pain > 2/10  Baseline: 5/10 after sitting 45 min Goal status: IN  PROGRESS   PLAN:  PT FREQUENCY: 2x/week  PT DURATION: 6 weeks  PLANNED INTERVENTIONS: 97164- PT Re-evaluation, 97110-Therapeutic exercises, 97530- Therapeutic activity, 97112- Neuromuscular re-education, 97535- Self Care, 02859- Manual therapy, [id number]- Gait training, [phone]- Orthotic Fit/training, [phone]- Canalith repositioning, [id number]- Aquatic Therapy, 97760- Splinting, [phone]- Wound care (first 20 sq cm), 97598- Wound care (each additional 20 sq cm)Patient/Family education, Balance training, Stair training, Taping, Dry Needling, Joint mobilization, Joint manipulation, Spinal manipulation, Spinal mobilization, Scar mobilization, and DME instructions. SABRA  PLAN FOR NEXT SESSION: Continue postural control and lumbar mobility interventions. Plan to discharge at next treatment due to surgery on 05/06/24.  Venus Galeazzi, SPT  4:39 PM, 04/29/24  Today's visit was completed with direct 1:1 supervision and direction by Lacinda Fass, PT, DPT   "

## 2024-04-30 NOTE — Progress Notes (Signed)
 Surgical Instructions   Your procedure is scheduled on Thursday, February 12th. Report to [facility] Main Entrance A at 5:30 A.M., then check in with the Admitting office. Any questions or running late day of surgery: call [phone]  Questions prior to your surgery date: call [phone], Monday-Friday, 8am-4pm. If you experience any cold or flu symptoms such as cough, fever, chills, shortness of breath, etc. between now and your scheduled surgery, please notify us  at the above number.     Remember:  Do not eat after midnight the night before your surgery  You may drink clear liquids until 4:30 the morning of your surgery.   Clear liquids allowed are: Water, Non-Citrus Juices (without pulp), Carbonated Beverages, Clear Tea (no milk, honey, etc.), Black Coffee Only (NO MILK, CREAM OR POWDERED CREAMER of any kind), and Gatorade.    Take these medicines the morning of surgery with A SIP OF WATER  amLODipine (NORVASC)  atorvastatin  (LIPITOR)  gabapentin  (NEURONTIN )  loratadine  (CLARITIN )  montelukast  (SINGULAIR )  pantoprazole  (PROTONIX )  sertraline (ZOLOFT)    May take these medicines IF NEEDED: acetaminophen  (TYLENOL )  albuterol  (VENTOLIN  HFA) 108 (90 Base) MCG/ACT inhaler - bring with you on day of surgery  Carboxymethylcellulose Sodium (REFRESH TEARS OP)  EPINEPHrine  injection  fluticasone  (FLONASE )  levalbuterol (XOPENEX HFA) inhaler  methocarbamol (ROBAXIN)  tiZANidine (ZANAFLEX)    Follow your surgeon's instructions on when to stop Asprin.  If no instructions were given by your surgeon then you will need to call the office to get those instructions.     Follow your surgeon's instructions on when to stop Buprenorphine HCl (BELBUCA).  If no instructions were given by your surgeon then you will need to call the office to get those instructions.     One week prior to surgery, STOP taking any Aspirin (unless otherwise instructed by your surgeon) Aleve, Naproxen, Motrin,  Goody's, BC's, all herbal medications, fish oil, and non-prescription vitamins. This includes your ibuprofen (ADVIL) and meloxicam  (MOBIC )                      Do NOT Smoke (Tobacco/Vaping) for 24 hours prior to your procedure.  If you use a CPAP at night, you may bring your mask/headgear for your overnight stay.   You will be asked to remove any contacts, glasses, piercing's, hearing aid's, dentures/partials prior to surgery. Please bring cases for these items if needed.    Your surgeon will determine if you are to be admitted or discharged the same day.  Patients discharged the day of surgery will not be allowed to drive home, and someone needs to stay with them for 24 hours.  SURGICAL WAITING ROOM VISITATION Patients may have no more than 2 support people in the waiting area - these visitors may rotate.   Pre-op nurse will coordinate an appropriate time for 2 ADULT support persons, who may not rotate, to accompany patient in pre-op.  Children under the age of 80 must have an adult with them who is not the patient and must remain in the main waiting area with an adult.  If the patient needs to stay at the hospital during part of their recovery, the visitor guidelines for inpatient rooms apply.  Please refer to the [facility] website for the visitor guidelines for any additional information.   If you received a COVID test during your pre-op visit  it is requested that you wear a mask when out in public, stay away from anyone that may  not be feeling well and notify your surgeon if you develop symptoms. If you have been in contact with anyone that has tested positive in the last 10 days please notify you surgeon.      Pre-operative 4 CHG Bathing Instructions   You can play a key role in reducing the risk of infection after surgery. Your skin needs to be as free of germs as possible. You can reduce the number of germs on your skin by washing with CHG (chlorhexidine  gluconate) soap before  surgery. CHG is an antiseptic soap that kills germs and continues to kill germs even after washing.   DO NOT use if you have an allergy to chlorhexidine /CHG or antibacterial soaps. If your skin becomes reddened or irritated, stop using the CHG and notify one of our RNs at [phone].   Please shower with the CHG soap starting 4 days before surgery using the following schedule:     Please keep in mind the following:  DO NOT shave, including legs and underarms, starting the day of your first shower.   You may shave your face at any point before/day of surgery.  Place clean sheets on your bed the day you start using CHG soap. Use a clean washcloth (not used since being washed) for each shower. DO NOT sleep with pets once you start using the CHG.   CHG Shower Instructions:  Wash your face and private area with normal soap. If you choose to wash your hair, wash first with your normal shampoo.  After you use shampoo/soap, rinse your hair and body thoroughly to remove shampoo/soap residue.  Turn the water OFF and apply  bottle of CHG soap to a CLEAN washcloth.  Apply CHG soap ONLY FROM YOUR NECK DOWN TO YOUR TOES (washing for 3-5 minutes)  DO NOT use CHG soap on face, private areas, open wounds, or sores.  Pay special attention to the area where your surgery is being performed.  If you are having back surgery, having someone wash your back for you may be helpful. Wait 2 minutes after CHG soap is applied, then you may rinse off the CHG soap.  Pat dry with a clean towel  Put on clean clothes/pajamas   If you choose to wear lotion, please use ONLY the CHG-compatible lotions that are listed below.  Additional instructions for the day of surgery:  If you choose, you may shower the morning of surgery with an antibacterial soap.  DO NOT APPLY any lotions, deodorants, cologne, or perfumes.   Do not bring valuables to the hospital. [facility] is not responsible for any belongings/valuables. Do  not wear nail polish, gel polish, artificial nails, or any other type of covering on natural nails (fingers and toes) Do not wear jewelry or makeup Put on clean/comfortable clothes.  Please brush your teeth.  Ask your nurse before applying any prescription medications to the skin.     CHG Compatible Lotions   Aveeno Moisturizing lotion  Cetaphil Moisturizing Cream  Cetaphil Moisturizing Lotion  Clairol Herbal Essence Moisturizing Lotion, Dry Skin  Clairol Herbal Essence Moisturizing Lotion, Extra Dry Skin  Clairol Herbal Essence Moisturizing Lotion, Normal Skin  Curel Age Defying Therapeutic Moisturizing Lotion with Alpha Hydroxy  Curel Extreme Care Body Lotion  Curel Soothing Hands Moisturizing Hand Lotion  Curel Therapeutic Moisturizing Cream, Fragrance-Free  Curel Therapeutic Moisturizing Lotion, Fragrance-Free  Curel Therapeutic Moisturizing Lotion, Original Formula  Eucerin Daily Replenishing Lotion  Eucerin Dry Skin Therapy Plus Alpha Hydroxy Crme  Eucerin Dry  Skin Therapy Plus Alpha Hydroxy Lotion  Eucerin Original Crme  Eucerin Original Lotion  Eucerin Plus Crme Eucerin Plus Lotion  Eucerin TriLipid Replenishing Lotion  Keri Anti-Bacterial Hand Lotion  Keri Deep Conditioning Original Lotion Dry Skin Formula Softly Scented  Keri Deep Conditioning Original Lotion, Fragrance Free Sensitive Skin Formula  Keri Lotion Fast Absorbing Fragrance Free Sensitive Skin Formula  Keri Lotion Fast Absorbing Softly Scented Dry Skin Formula  Keri Original Lotion  Keri Skin Renewal Lotion Keri Silky Smooth Lotion  Keri Silky Smooth Sensitive Skin Lotion  Nivea Body Creamy Conditioning Oil  Nivea Body Extra Enriched Lotion  Nivea Body Original Lotion  Nivea Body Sheer Moisturizing Lotion Nivea Crme  Nivea Skin Firming Lotion  NutraDerm 30 Skin Lotion  NutraDerm Skin Lotion  NutraDerm Therapeutic Skin Cream  NutraDerm Therapeutic Skin Lotion  ProShield Protective Hand Cream   Provon moisturizing lotion  Please read over the following fact sheets that you were given.

## 2024-05-03 ENCOUNTER — Inpatient Hospital Stay (HOSPITAL_COMMUNITY): Admission: RE | Admit: 2024-05-03

## 2024-05-04 ENCOUNTER — Ambulatory Visit (HOSPITAL_COMMUNITY)

## 2024-05-06 ENCOUNTER — Encounter (HOSPITAL_COMMUNITY): Admission: RE | Payer: Self-pay | Source: Home / Self Care

## 2024-05-06 ENCOUNTER — Ambulatory Visit (HOSPITAL_COMMUNITY): Admission: RE | Admit: 2024-05-06 | Admitting: Orthopedic Surgery

## 2024-05-06 ENCOUNTER — Ambulatory Visit (HOSPITAL_COMMUNITY)

## 2024-07-07 ENCOUNTER — Ambulatory Visit: Payer: Self-pay | Admitting: Internal Medicine

## 2024-07-27 ENCOUNTER — Inpatient Hospital Stay

## 2024-08-03 ENCOUNTER — Inpatient Hospital Stay: Admitting: Physician Assistant
